# Patient Record
Sex: Female | Born: 1955
Health system: Southern US, Community
[De-identification: ages and names within clinical notes are randomized; demographics above are authoritative.]

## PROBLEM LIST (undated history)

## (undated) DIAGNOSIS — M1990 Unspecified osteoarthritis, unspecified site: Secondary | ICD-10-CM

## (undated) DIAGNOSIS — Z973 Presence of spectacles and contact lenses: Secondary | ICD-10-CM

## (undated) DIAGNOSIS — L409 Psoriasis, unspecified: Secondary | ICD-10-CM

## (undated) DIAGNOSIS — E785 Hyperlipidemia, unspecified: Secondary | ICD-10-CM

## (undated) DIAGNOSIS — E079 Disorder of thyroid, unspecified: Secondary | ICD-10-CM

## (undated) DIAGNOSIS — L4050 Arthropathic psoriasis, unspecified: Secondary | ICD-10-CM

## (undated) DIAGNOSIS — B001 Herpesviral vesicular dermatitis: Secondary | ICD-10-CM

## (undated) DIAGNOSIS — L209 Atopic dermatitis, unspecified: Secondary | ICD-10-CM

## (undated) DIAGNOSIS — T8859XA Other complications of anesthesia, initial encounter: Secondary | ICD-10-CM

## (undated) HISTORY — PX: COLONOSCOPY: SHX174

## (undated) HISTORY — PX: WISDOM TOOTH EXTRACTION: SHX21

## (undated) HISTORY — DX: Disorder of thyroid, unspecified: E07.9

## (undated) HISTORY — DX: Hyperlipidemia, unspecified: E78.5

## (undated) HISTORY — PX: OTHER SURGICAL HISTORY: SHX169

## (undated) HISTORY — DX: Psoriasis, unspecified: L40.9

## (undated) HISTORY — PX: TUBAL LIGATION: SHX77

## (undated) HISTORY — PX: KNEE ARTHROSCOPY: SUR90

## (undated) HISTORY — PX: SHOULDER ARTHROSCOPY: SHX128

---

## 1997-01-17 HISTORY — PX: COSMETIC SURGERY: SHX468

## 1997-04-26 ENCOUNTER — Emergency Department (HOSPITAL_COMMUNITY): Admission: EM | Admit: 1997-04-26 | Discharge: 1997-04-26 | Payer: Self-pay | Admitting: Emergency Medicine

## 1998-01-17 HISTORY — PX: ABDOMINAL HYSTERECTOMY: SHX81

## 1998-11-11 ENCOUNTER — Encounter: Admission: RE | Admit: 1998-11-11 | Discharge: 1998-11-11 | Payer: Self-pay | Admitting: General Surgery

## 1998-11-11 ENCOUNTER — Encounter: Payer: Self-pay | Admitting: General Surgery

## 1999-01-18 HISTORY — PX: BREAST SURGERY: SHX581

## 1999-06-23 ENCOUNTER — Other Ambulatory Visit: Admission: RE | Admit: 1999-06-23 | Discharge: 1999-06-23 | Payer: Self-pay | Admitting: Obstetrics and Gynecology

## 1999-07-15 ENCOUNTER — Encounter: Admission: RE | Admit: 1999-07-15 | Discharge: 1999-07-15 | Payer: Self-pay | Admitting: Plastic Surgery

## 1999-07-15 ENCOUNTER — Encounter: Payer: Self-pay | Admitting: Plastic Surgery

## 2000-02-24 ENCOUNTER — Encounter: Admission: RE | Admit: 2000-02-24 | Discharge: 2000-02-24 | Payer: Self-pay | Admitting: Family Medicine

## 2000-02-24 ENCOUNTER — Encounter: Payer: Self-pay | Admitting: Family Medicine

## 2000-06-28 ENCOUNTER — Other Ambulatory Visit: Admission: RE | Admit: 2000-06-28 | Discharge: 2000-06-28 | Payer: Self-pay | Admitting: Obstetrics and Gynecology

## 2000-11-08 ENCOUNTER — Encounter: Admission: RE | Admit: 2000-11-08 | Discharge: 2000-11-08 | Payer: Self-pay | Admitting: Orthopedic Surgery

## 2000-11-08 ENCOUNTER — Encounter: Payer: Self-pay | Admitting: Orthopedic Surgery

## 2001-08-08 ENCOUNTER — Other Ambulatory Visit: Admission: RE | Admit: 2001-08-08 | Discharge: 2001-08-08 | Payer: Self-pay | Admitting: Obstetrics and Gynecology

## 2001-08-24 ENCOUNTER — Encounter: Payer: Self-pay | Admitting: Obstetrics and Gynecology

## 2001-08-24 ENCOUNTER — Encounter: Admission: RE | Admit: 2001-08-24 | Discharge: 2001-08-24 | Payer: Self-pay | Admitting: Obstetrics and Gynecology

## 2002-01-07 ENCOUNTER — Emergency Department (HOSPITAL_COMMUNITY): Admission: EM | Admit: 2002-01-07 | Discharge: 2002-01-08 | Payer: Self-pay | Admitting: Emergency Medicine

## 2002-01-08 ENCOUNTER — Encounter: Payer: Self-pay | Admitting: Emergency Medicine

## 2002-02-01 ENCOUNTER — Ambulatory Visit (HOSPITAL_COMMUNITY): Admission: RE | Admit: 2002-02-01 | Discharge: 2002-02-01 | Payer: Self-pay | Admitting: Cardiology

## 2002-09-03 ENCOUNTER — Other Ambulatory Visit: Admission: RE | Admit: 2002-09-03 | Discharge: 2002-09-03 | Payer: Self-pay | Admitting: Obstetrics and Gynecology

## 2002-11-07 ENCOUNTER — Encounter: Admission: RE | Admit: 2002-11-07 | Discharge: 2002-11-07 | Payer: Self-pay | Admitting: Obstetrics and Gynecology

## 2002-11-07 ENCOUNTER — Encounter: Payer: Self-pay | Admitting: Obstetrics and Gynecology

## 2003-11-24 ENCOUNTER — Encounter: Admission: RE | Admit: 2003-11-24 | Discharge: 2003-11-24 | Payer: Self-pay | Admitting: Obstetrics and Gynecology

## 2006-01-17 ENCOUNTER — Emergency Department (HOSPITAL_COMMUNITY): Admission: EM | Admit: 2006-01-17 | Discharge: 2006-01-17 | Payer: Self-pay | Admitting: Family Medicine

## 2006-12-06 ENCOUNTER — Encounter: Admission: RE | Admit: 2006-12-06 | Discharge: 2006-12-06 | Payer: Self-pay | Admitting: Obstetrics and Gynecology

## 2008-12-26 ENCOUNTER — Encounter: Admission: RE | Admit: 2008-12-26 | Discharge: 2008-12-26 | Payer: Self-pay | Admitting: Obstetrics and Gynecology

## 2009-02-13 LAB — HM PAP SMEAR

## 2009-11-30 ENCOUNTER — Encounter: Admission: RE | Admit: 2009-11-30 | Discharge: 2009-11-30 | Payer: Self-pay | Admitting: Family Medicine

## 2009-11-30 IMAGING — CT CT ANGIO CHEST
3 of 7 series · 10 of 30 positions shown · IV contrast ([ID] OMNI 300)
Comparison: None.

CLINICAL DATA: Pleuritic chest pain.  Cough.

CT ANGIOGRAPHY CHEST WITH CONTRAST
TECHNIQUE: Multidetector CT imaging of the chest was performed
using the standard protocol during bolus administration of
intravenous contrast.  Multiplanar CT image reconstructions
including MIPs were obtained to evaluate the vascular anatomy.
Contrast:  100 ml [DT]

[Series 4: pe 1.25 · axial · 0.70mm/px · z∈[-242,-32]mm · 6 of 236 slices shown]
[im 34/236  lung]
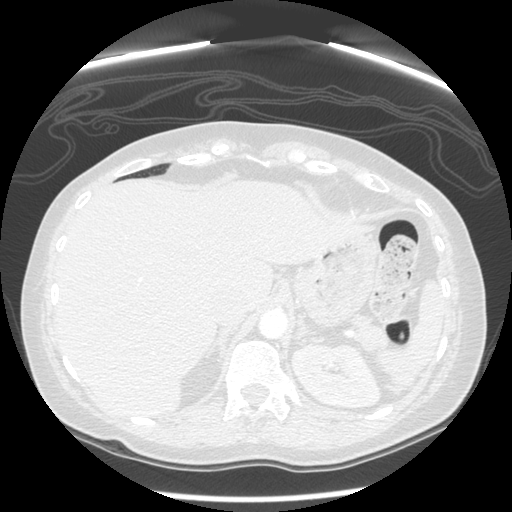
[im 68/236  mediastinal]
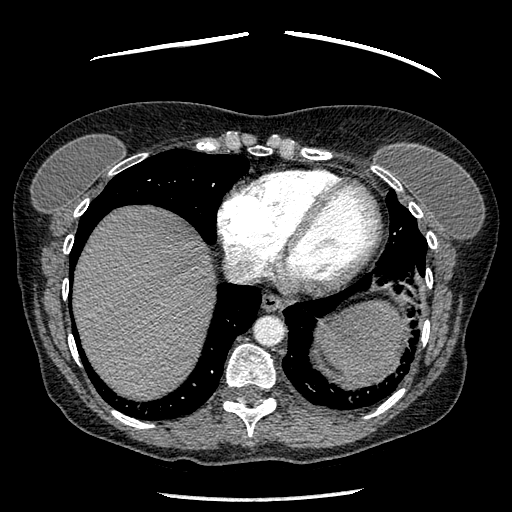
[im 101/236  lung]
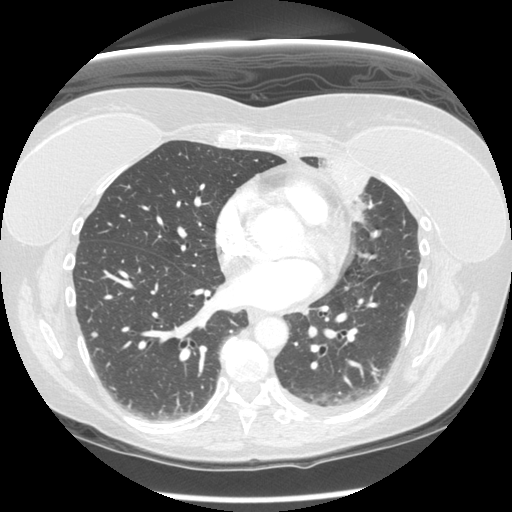
[im 135/236  mediastinal]
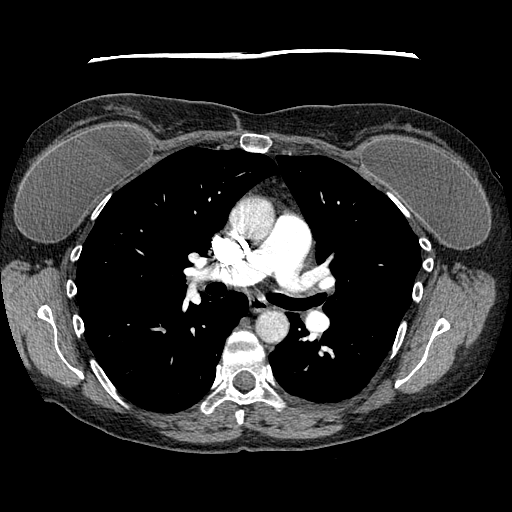
[im 168/236  lung]
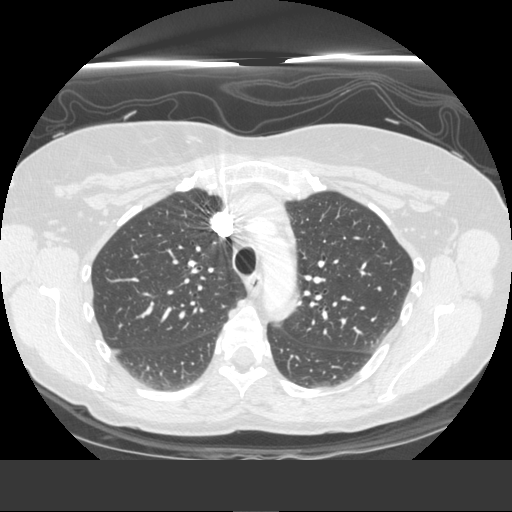
[im 202/236  mediastinal]
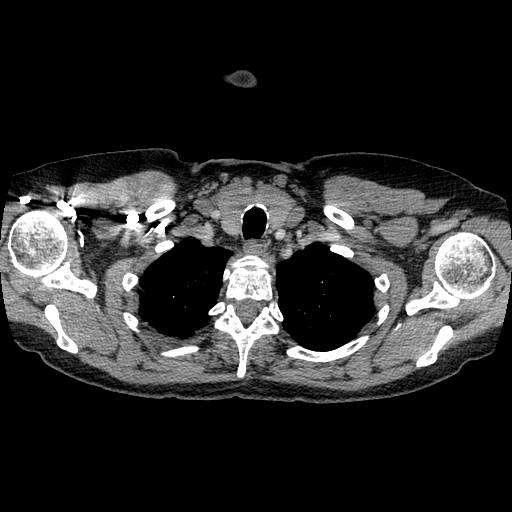

[Series 5: pe 2.5 · axial · 0.70mm/px · z∈[-185,-88]mm · 2 of 118 slices shown]
[im 40/118  lung]
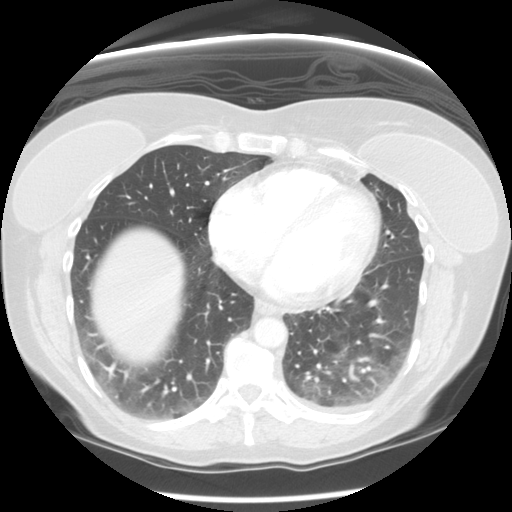
[im 79/118  lung]
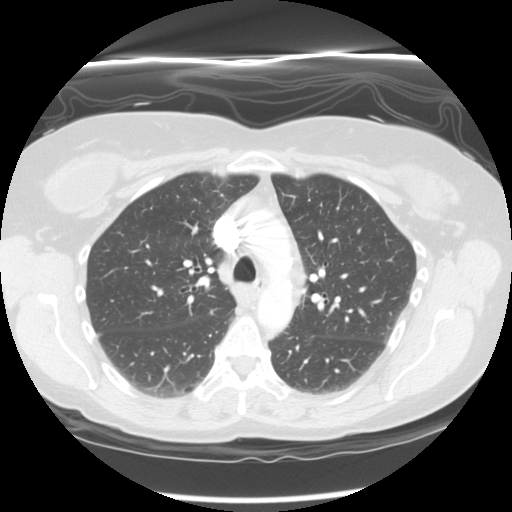

[Series 602: sagittal body · sagittal · 0.70mm/px · 2 of 145 slices shown]
[im 49/145  lung]
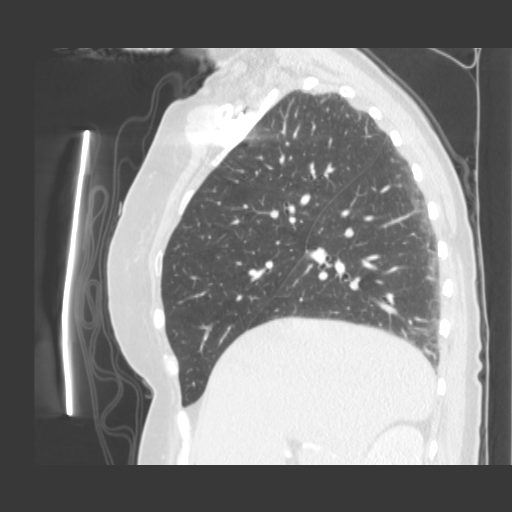
[im 97/145  lung]
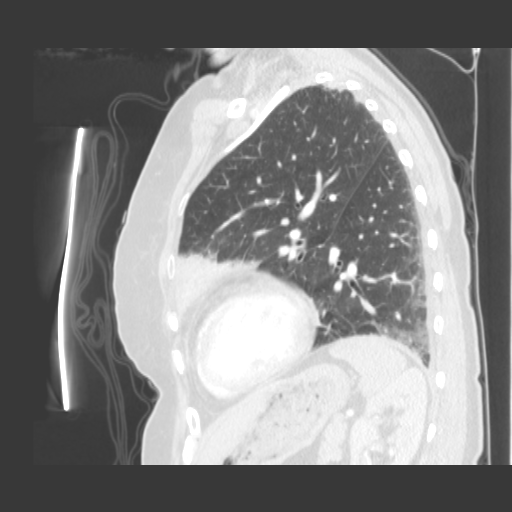

[10 of 30 positions shown; findings below may reference images not displayed]

FINDINGS: No pulmonary embolus.  Mediastinal lymph nodes measure
up to 8 mm in the AP window.  Mild bihilar lymphoid tissue.  No
axillary adenopathy.  Heart size normal.  No pericardial effusion.

Tiny left pleural effusion.  There is focal airspace consolidation
in the lingula, with surrounding inflammatory ground-glass.  A 4 mm
nodule in the right lower lobe is nonspecific (image 67).  Mild
dependent atelectasis bilaterally.  Airway is unremarkable.

Incidental imaging of the upper abdomen shows no acute findings.
No worrisome lytic or sclerotic lesions.

Review of the MIP images confirms the above findings.
IMPRESSION: 1.  No pulmonary embolus.
2.  Left upper lobe pneumonia.  Follow-up to clearing is
recommended.
3.  Tiny left parapneumonic effusion.

## 2009-12-17 ENCOUNTER — Encounter: Admission: RE | Admit: 2009-12-17 | Discharge: 2009-12-17 | Payer: Self-pay | Admitting: Family Medicine

## 2009-12-17 IMAGING — CR DG CHEST 2V
2 series · 2 of 2 positions shown · non-contrast
Comparison: Chest CTA [DATE].

CLINICAL DATA: 53-year-old female with cough and history of
smoking.  Abnormal chest CTA last month.

CHEST - 2 VIEW

[w chest pa]
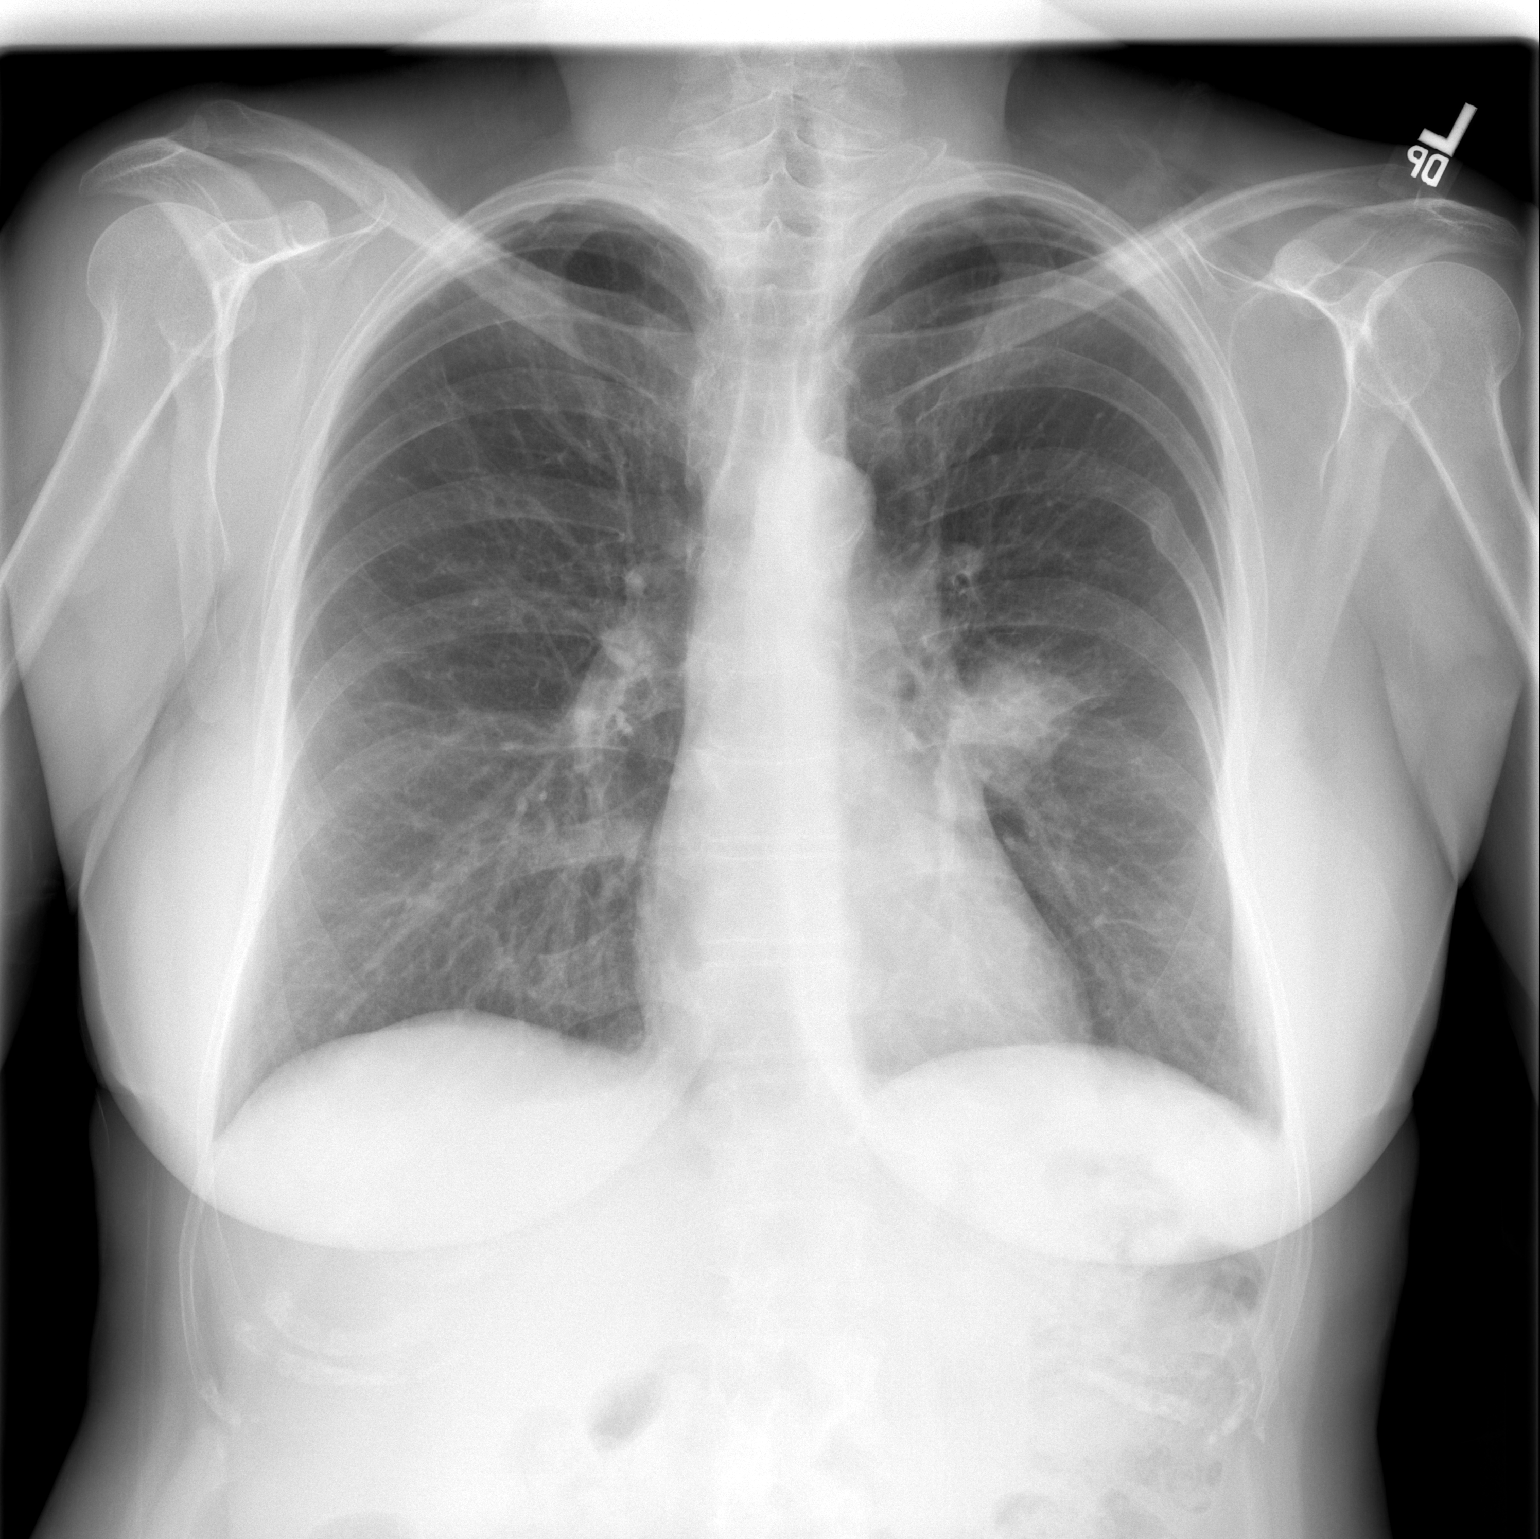

[w chest lat]
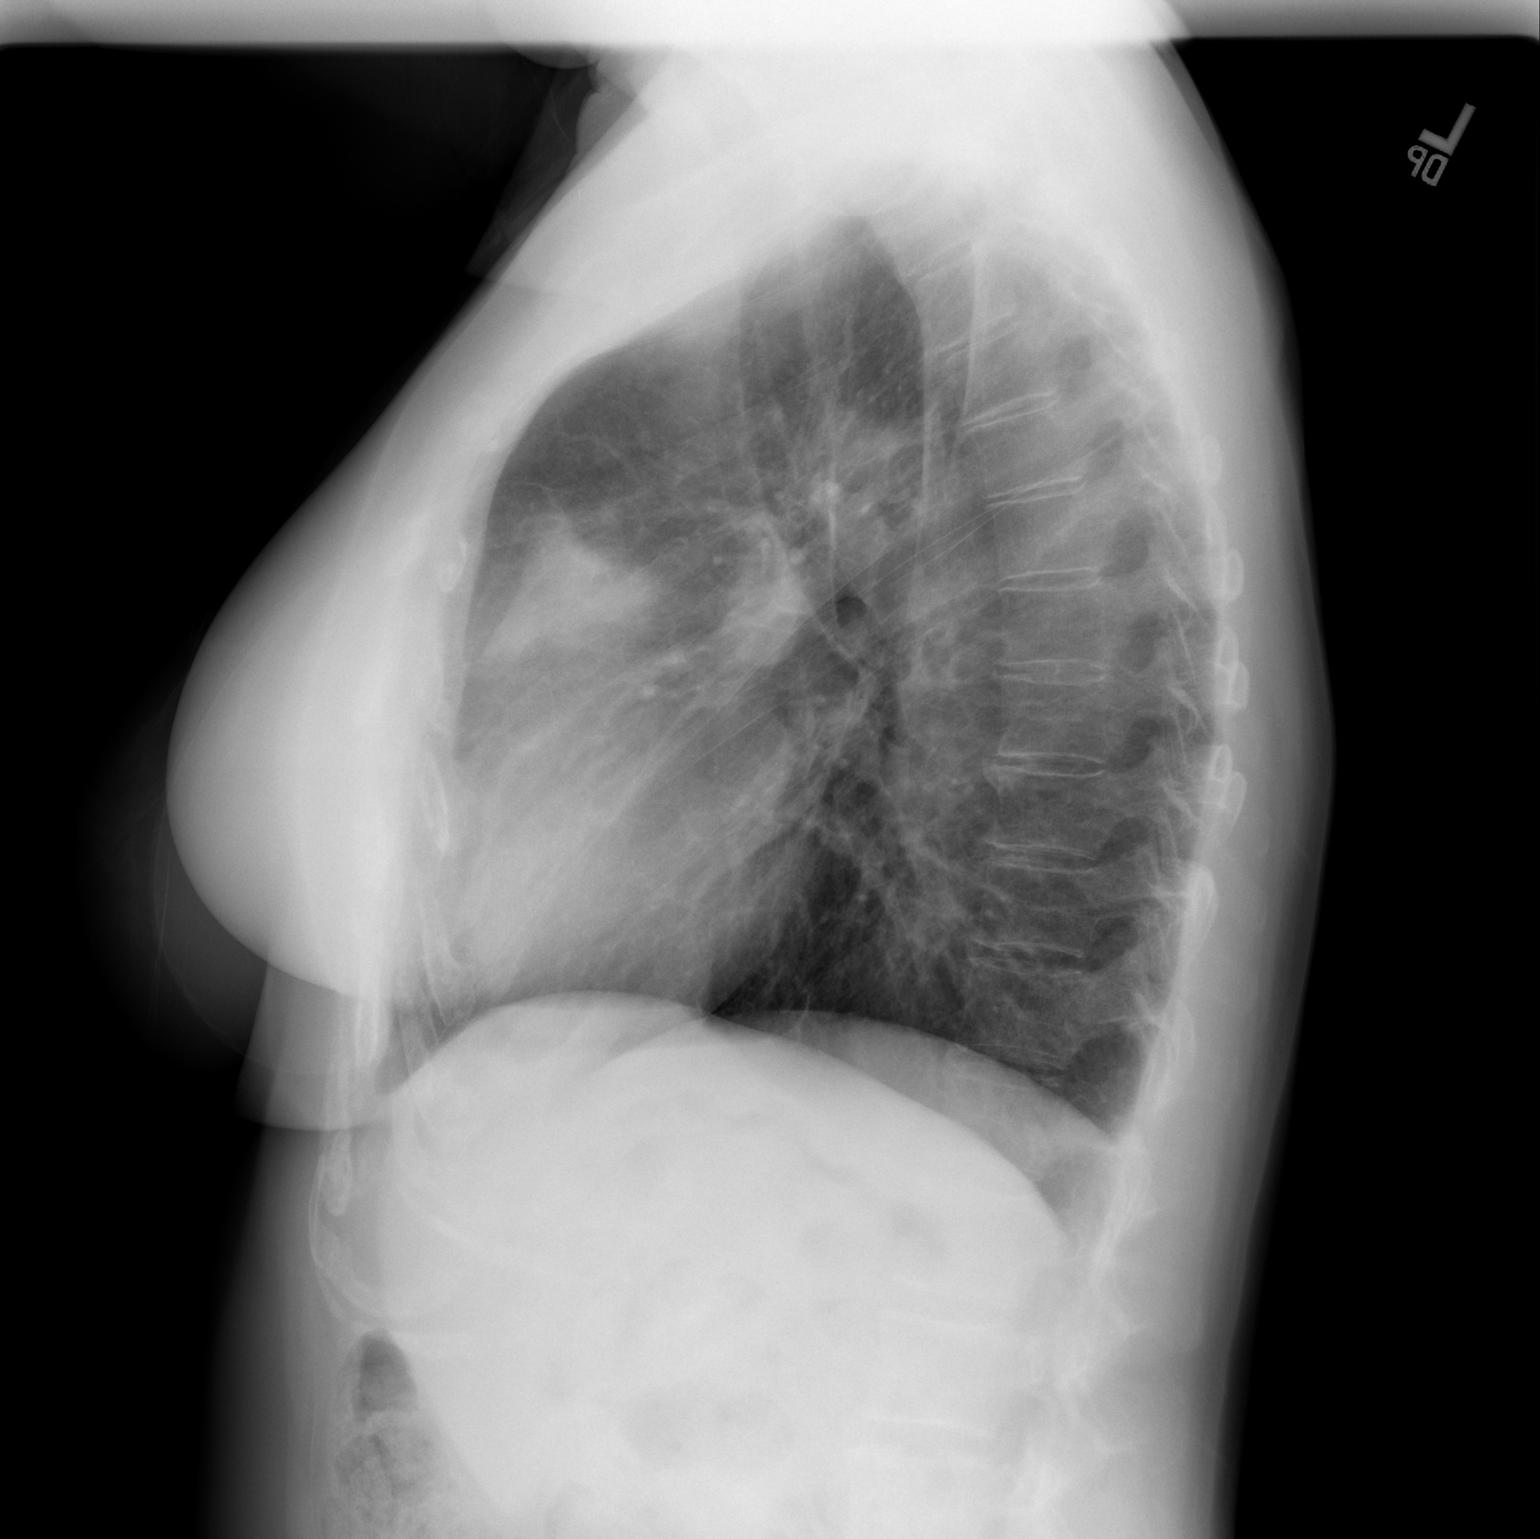

[2 of 2 positions shown; findings below may reference images not displayed]

FINDINGS: Focal triangular area of airspace consolidation in the
anteroinferior left upper lobe persists but has mildly decreased in
extent  since the comparison exam (compare today's lateral versus
sagittal series 602 image 99 of the comparison).

Pulmonary ventilation elsewhere is stable and within normal limits.
Cardiac size and mediastinal contours are within normal limits.  No
pleural effusion.  Chronic left posterior lateral rib fractures. No
acute osseous abnormality identified.
IMPRESSION: Anterior left upper lobe consolidation has not resolved, but is
mildly decreased in extent (51 x 31 mm on today's lateral versus 57
x 39 mm on the prior CT), and so may represent an under-treated
pneumonia.
Recommend repeat chest CT (IV contrast preferred) if this fails to
resolve in another couple of weeks.

## 2009-12-30 ENCOUNTER — Encounter
Admission: RE | Admit: 2009-12-30 | Discharge: 2009-12-30 | Payer: Self-pay | Source: Home / Self Care | Attending: Obstetrics and Gynecology | Admitting: Obstetrics and Gynecology

## 2010-06-04 NOTE — Cardiovascular Report (Signed)
NAME:  Ann Newton, Ann Newton                        ACCOUNT NO.:  [id number]   MEDICAL RECORD NO.:  [id number]                   PATIENT TYPE:  OIB   LOCATION:  2857                                 FACILITY:  [facility]   PHYSICIAN:  Francisca December, M.D.               DATE OF BIRTH:  05-14-1955   DATE OF PROCEDURE:  02/01/2002  DATE OF DISCHARGE:  02/01/2002                              CARDIAC CATHETERIZATION   PROCEDURES PERFORMED:  1. Left heart catheterization.  2. Coronary angiography.  3. Left ventriculogram.   INDICATIONS:  The patient is a 55 year old woman with multiple risk factors  for coronary disease, who has developed exertional angina.  Despite this, an  exercise stress test performed in the office was electrocardiographically  negative; however, she did develop anginal chest discomfort on the  treadmill.  She was therefore brought to the cardiac catheterization  laboratory to definitely rule out the presence of significant coronary  disease.   DESCRIPTION OF PROCEDURE:  The patient was brought to the cardiac  catheterization laboratory in the postabsorptive state.  The right groin was  prepped and draped in the usual sterile fashion.  Local anesthesia was  obtained with the infiltration of 1% lidocaine.  A 5 French catheter sheath  was inserted percutaneously into the right femoral artery utilizing an  anterior approach over a guiding J wire. A 110 cm pigtail catheter was used  to measure pressures in the ascending aorta and in the left ventricle both  prior to and following the ventriculogram. A 30-degree RAO cine left  ventriculogram was performed utilizing a power injector.  A 5 French #4 left and right Judkins catheters were then used to perform  cineangiography of each coronary artery in multiple LAO and RAO projections.  At the completion of the procedure, the catheter and catheter sheaths were  removed.  Hemostasis was obtained by direct pressure.  The patient  was  transported to the recovery area in stable condition with an intact distal  pulse.  All catheter manipulations were performed using fluoroscopic  observation and exchanges performed over a long guiding J wire.   HEMODYNAMICS:  Systemic arterial pressure was 100/60 with a mean of 78 mmHg.  There was no systolic gradient across the aortic valve.  The left  ventricular end-diastolic pressure was 9 mmHg pre ventriculogram and 11 mmHg  post ventriculogram.   ANGIOGRAPHY:  The left ventriculogram demonstrated normal chamber size and  normal global systolic function without significant regional wall motion  abnormality.  There is no significant mitral regurgitation and no coronary  calcification seen.  The visual estimate of the ejection fraction is 60%.   There was a right dominant coronary system present.  The main left coronary  was normal.   The left anterior descending artery and its branches were normal.  One large  diagonal and two smaller diagonals arise which are free of obstruction. The  anterior  descending artery reaches and barely traverses the apex.   The left circumflex coronary artery and its branches were normal.  Four  marginal branches arise, the first of which is moderate in size.  The second  is small. The third and forth are moderate to large.  Again, no obstructions whatsoever are seen within the left circumflex or  anterior descending arteries.   The right coronary artery and its branches are relatively small.  It did  give rise to AV nodal artery.  There is an acute marginal branch which  provides the distal septal perforators.  There is a small posterior  descending artery and very small posterolateral segment and branch.  Again,  no significant obstructions are seen whatsoever within these vessels.   Collateral vessels are not seen.   FINAL IMPRESSION:  1. Normal left ventricular size and systolic function.  2. Normal coronary arteries.  3. Noncardiac  rather typical sounding anginal chest pain.   PLAN/RECOMMENDATION:  The patient is presented with this gratifying news.  No coronary ischemic basis can be identified as an etiology for her  discomfort.  She will be treated empirically for reflux with a proton pump  inhibitor and should followup with Dr. Arvilla Market if this discomfort persist.                                                  Francisca December, M.D.    JHE/MEDQ  D:  02/01/2002  T:  02/02/2002  Job:  [id number]   cc:   Donia Guiles, M.D.  [address] [place]  [place] [address]  Fax: [phone]

## 2010-10-28 DIAGNOSIS — L403 Pustulosis palmaris et plantaris: Secondary | ICD-10-CM | POA: Insufficient documentation

## 2011-01-05 ENCOUNTER — Other Ambulatory Visit: Payer: Self-pay | Admitting: Obstetrics and Gynecology

## 2011-01-05 DIAGNOSIS — Z1231 Encounter for screening mammogram for malignant neoplasm of breast: Secondary | ICD-10-CM

## 2011-01-21 ENCOUNTER — Ambulatory Visit: Payer: Self-pay

## 2011-02-09 ENCOUNTER — Ambulatory Visit
Admission: RE | Admit: 2011-02-09 | Discharge: 2011-02-09 | Disposition: A | Discharge status: Home / Self Care | Payer: 59 | Source: Ambulatory Visit | Attending: Obstetrics and Gynecology | Admitting: Obstetrics and Gynecology

## 2011-02-09 DIAGNOSIS — Z1231 Encounter for screening mammogram for malignant neoplasm of breast: Secondary | ICD-10-CM

## 2011-02-15 ENCOUNTER — Other Ambulatory Visit: Payer: Self-pay | Admitting: Obstetrics and Gynecology

## 2011-02-15 DIAGNOSIS — R928 Other abnormal and inconclusive findings on diagnostic imaging of breast: Secondary | ICD-10-CM

## 2011-02-21 ENCOUNTER — Ambulatory Visit
Admission: RE | Admit: 2011-02-21 | Discharge: 2011-02-21 | Disposition: A | Discharge status: Home / Self Care | Payer: 59 | Source: Ambulatory Visit | Attending: Obstetrics and Gynecology | Admitting: Obstetrics and Gynecology

## 2011-02-21 DIAGNOSIS — R928 Other abnormal and inconclusive findings on diagnostic imaging of breast: Secondary | ICD-10-CM

## 2012-04-17 ENCOUNTER — Other Ambulatory Visit: Payer: Self-pay | Admitting: Obstetrics and Gynecology

## 2012-04-17 DIAGNOSIS — Z9882 Breast implant status: Secondary | ICD-10-CM

## 2012-04-17 DIAGNOSIS — N6009 Solitary cyst of unspecified breast: Secondary | ICD-10-CM

## 2012-04-30 ENCOUNTER — Other Ambulatory Visit: Payer: 59

## 2012-10-21 ENCOUNTER — Ambulatory Visit: Payer: 59

## 2012-10-21 ENCOUNTER — Ambulatory Visit (INDEPENDENT_AMBULATORY_CARE_PROVIDER_SITE_OTHER): Payer: 59 | Admitting: Family Medicine

## 2012-10-21 VITALS — BP 120/80 | HR 80 | Temp 98.8°F | Resp 16 | Ht 68.0 in | Wt 173.4 lb

## 2012-10-21 DIAGNOSIS — R109 Unspecified abdominal pain: Secondary | ICD-10-CM

## 2012-10-21 DIAGNOSIS — R079 Chest pain, unspecified: Secondary | ICD-10-CM

## 2012-10-21 DIAGNOSIS — R059 Cough, unspecified: Secondary | ICD-10-CM

## 2012-10-21 DIAGNOSIS — R42 Dizziness and giddiness: Secondary | ICD-10-CM

## 2012-10-21 DIAGNOSIS — R05 Cough: Secondary | ICD-10-CM

## 2012-10-21 IMAGING — CR DG CHEST 2V
2 series · 2 of 2 positions shown · non-contrast
Comparison: [DATE]

CLINICAL DATA: Left lower abdominal pain for 1 week.

EXAM:
CHEST  2 VIEW

[PA]
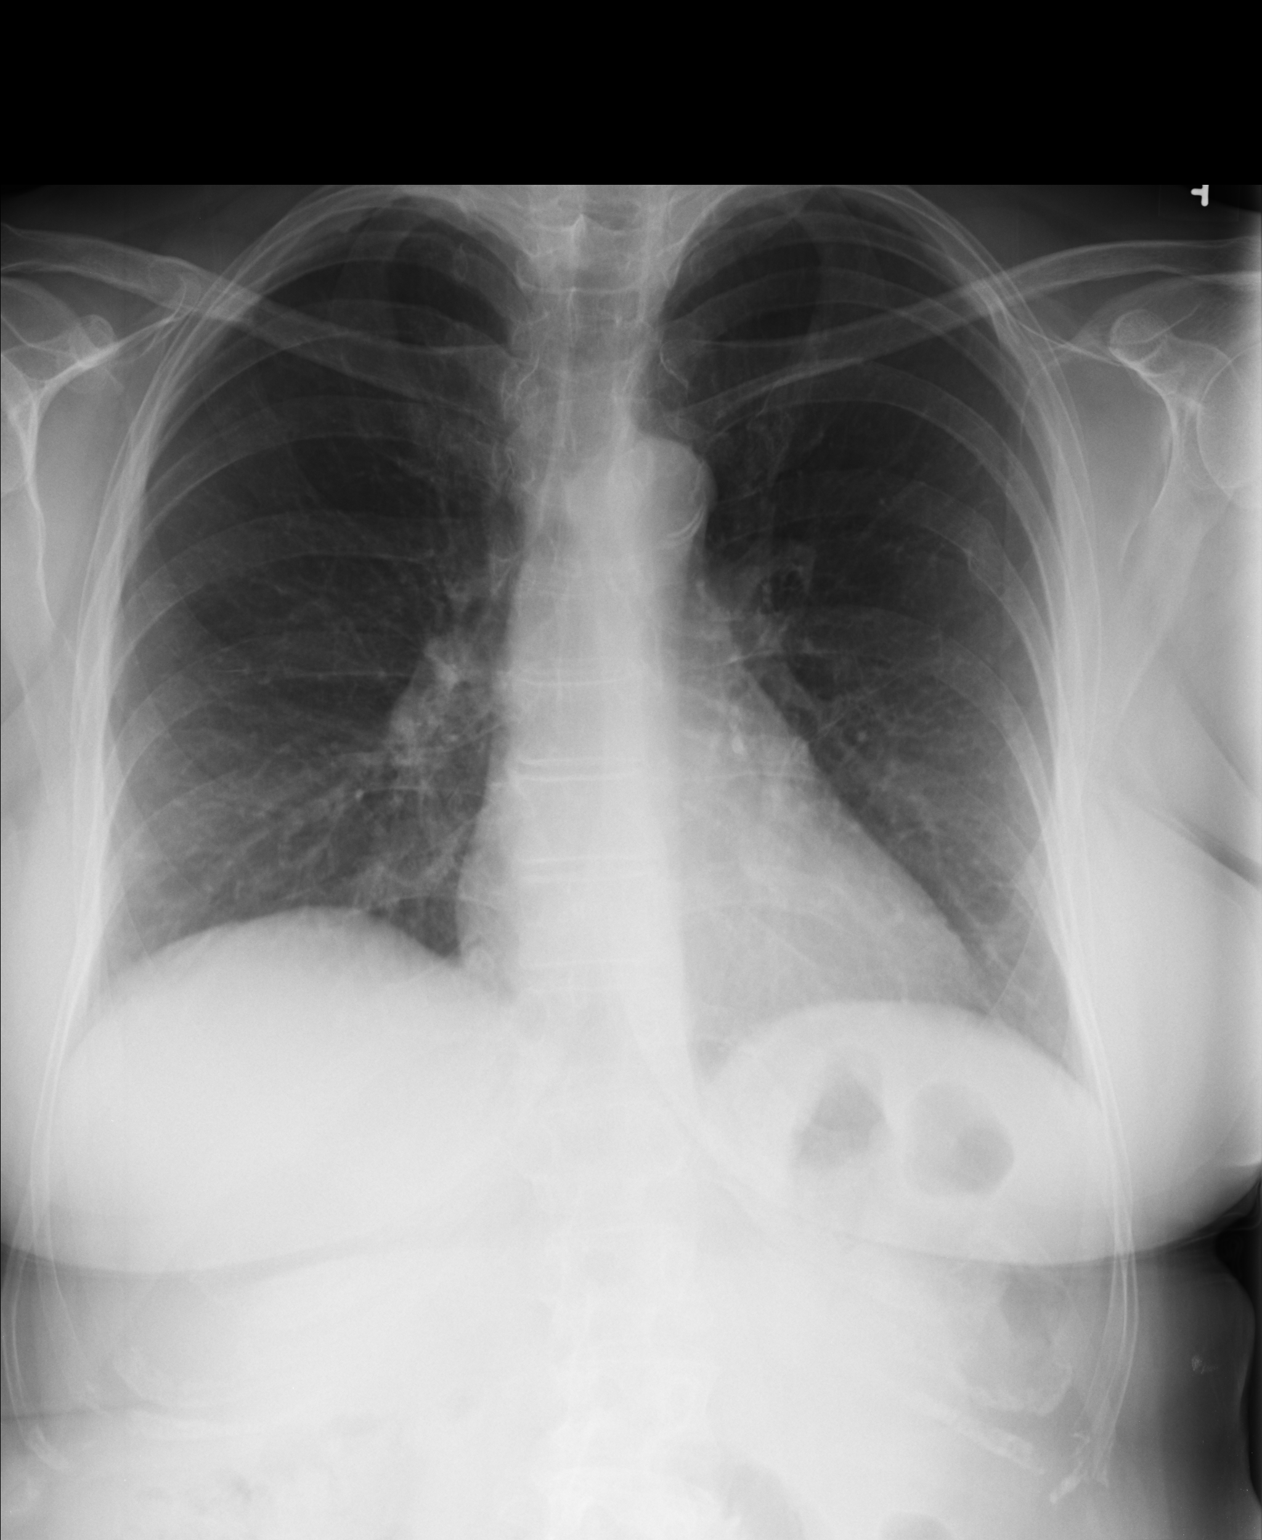

[lateral]
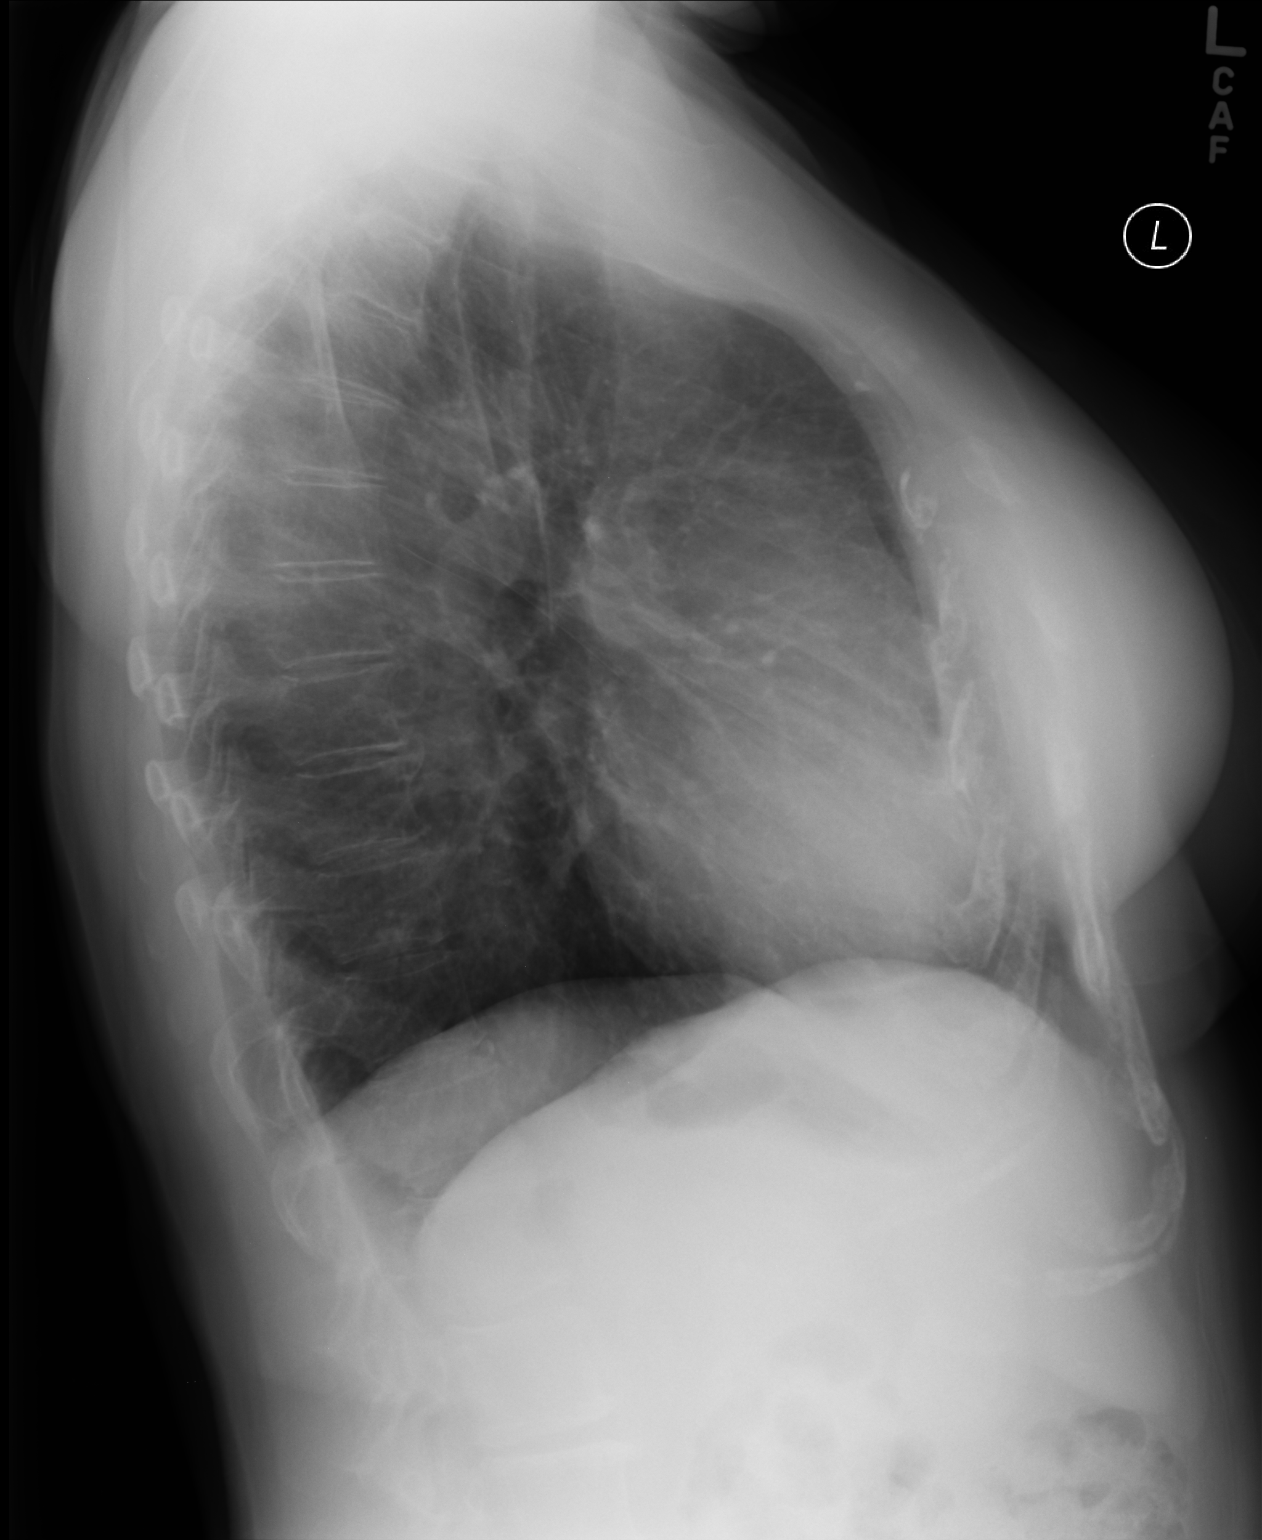

[2 of 2 positions shown; findings below may reference images not displayed]

FINDINGS: Lungs are clear. The cardiomediastinal silhouette and remainder of
the exam is unchanged to include old left rib fractures.
IMPRESSION: No active cardiopulmonary disease.

## 2012-10-21 MED ORDER — AZITHROMYCIN 250 MG PO TABS: ORAL_TABLET | ORAL | Status: DC

## 2012-10-21 MED ORDER — HYDROCODONE-HOMATROPINE 5-1.5 MG/5ML PO SYRP: 5.0000 mL | ORAL_SOLUTION | Freq: Three times a day (TID) | ORAL | Status: DC | PRN

## 2012-10-21 MED ORDER — PREDNISONE 20 MG PO TABS: ORAL_TABLET | ORAL | Status: DC

## 2012-10-21 NOTE — Progress Notes (Addendum)
Subjective:    Patient ID: Ann Newton, female    DOB: [birth year], 56 y.o.   MRN: [id number]  HPI    Review of Systems     Objective:   Physical Exam        Assessment & Plan:   This chart was scribed for Elvina Sidle, MD by Valera Castle, ED Scribe. This patient was seen in room 8 and the patient's care was started at 1:09.   @[facility]GO @  Patient ID: Ann Newton MRN: [id number], DOB: [birth year], 57 y.o. Date of Encounter: 10/21/2012, 1:08 PM  .  Primary Physician: No primary provider on file.  Chief Complaint: Abdominal Pain, Dizziness  HPI: 57 y.o. year old female with history below presents with abdominal pain and dizziness. She states that the abdominal pain has been consistent with 1 spot underneath her rib. She states that when she coughs it exacerbates the pain. She reports that there has been some congestion build up recently. She states that she yawns all the time trying to pop her ears, but has been unable to.  She denies any rhinorrhea, fever, or any other associated symptoms.   She denies having a h/o asthma. She reports smoking about .5 PPD.    History reviewed. No pertinent past medical history.   Home Meds: Prior to Admission medications   Medication Sig Start Date End Date Taking? Authorizing Provider  acyclovir (ZOVIRAX) 400 MG tablet Take 400 mg by mouth daily as needed.   Yes Historical Provider, MD  estradiol (ESTRACE) 1 MG tablet Take 1 mg by mouth daily.   Yes Historical Provider, MD  hydrochlorothiazide (HYDRODIURIL) 25 MG tablet Take 25 mg by mouth daily.   Yes Historical Provider, MD  meloxicam (MOBIC) 7.5 MG tablet Take 7.5 mg by mouth daily as needed for pain.   Yes Historical Provider, MD  vitamin B-12 (CYANOCOBALAMIN) 1000 MCG tablet Take 2,500 mcg by mouth daily.   Yes Historical Provider, MD    Allergies: No Known Allergies  History   Social History  . Marital Status: Married    Spouse Name: N/A    Number of Children:  N/A  . Years of Education: N/A   Occupational History  . Not on file.   Social History Main Topics  . Smoking status: Current Every Day Smoker  . Smokeless tobacco: Not on file  . Alcohol Use: Not on file  . Drug Use: Not on file  . Sexual Activity: Not on file   Other Topics Concern  . Not on file   Social History Narrative  . No narrative on file     Review of Systems: Constitutional: negative for chills, fever, night sweats, weight changes, or fatigue  HEENT: negative for vision changes, hearing loss, congestion, rhinorrhea, ST, epistaxis, or sinus pressure Cardiovascular: negative for chest pain or palpitations Respiratory: negative for hemoptysis, wheezing,  Abdominal: negative for  nausea, vomiting, diarrhea, or constipation Dermatological: negative for rash Neurologic: negative for headache, dizziness, or syncope All other systems reviewed and are otherwise negative with the exception to those above and in the HPI.   Physical Exam: Blood pressure 120/80, pulse 80, temperature 98.8 F (37.1 C), temperature source Oral, resp. rate 16, height 5\' 8"  (1.727 m), weight 173 lb 6.4 oz (78.654 kg), SpO2 97.00%., Body mass index is 26.37 kg/(m^2). General: Well developed, well nourished, in no acute distress. Head: Normocephalic, atraumatic, eyes without discharge, sclera non-icteric, nares are without discharge. Bilateral auditory canals clear, TM's are without perforation, pearly  grey and translucent with reflective cone of light bilaterally. Oral cavity moist, posterior pharynx without exudate, erythema, peritonsillar abscess, or post nasal drip.  Neck: Supple. No thyromegaly. Full ROM. No lymphadenopathy. Lungs: Clear bilaterally to auscultation without wheezes, rales, or rhonchi. Breathing is unlabored.  Tender just below left breast on lower anterior ribs at [facility] Heart: RRR with S1 S2. No murmurs, rubs, or gallops appreciated. Abdomen: Soft, non-tender, non-distended with  normoactive bowel sounds. No hepatomegaly. No rebound/guarding. No obvious abdominal masses. Msk:  Strength and tone normal for age. Extremities/Skin: Warm and dry. No clubbing or cyanosis. No edema. No rashes or suspicious lesions. Neuro: Alert and oriented X 3. Moves all extremities spontaneously. Gait is normal. CNII-XII grossly in tact. Psych:  Responds to questions appropriately with a normal affect.  [facility] reading (PRIMARY) by  Dr. Milus Glazier:  CXR:  Fine interstitial markings.   ASSESSMENT AND PLAN:  57 y.o. year old female with persistent cough, now productive in am, with fullness in ears and sense of congestion and decreased hearing for 3 months.  As a smoker, she needed the chest film. Dizziness and giddiness - Plan: DG Chest 2 View  Abdominal  pain, other specified site - Plan: DG Chest 2 View  Chest pain - Plan: azithromycin (ZITHROMAX Z-PAK) 250 MG tablet  Cough - Plan: predniSONE (DELTASONE) 20 MG tablet, HYDROcodone-homatropine (HYCODAN) 5-1.5 MG/5ML syrup  Signed, Elvina Sidle, MD    Signed, Elvina Sidle, MD 10/21/2012 1:08 PM

## 2012-11-19 ENCOUNTER — Other Ambulatory Visit: Payer: Self-pay | Admitting: Obstetrics and Gynecology

## 2012-11-19 DIAGNOSIS — N63 Unspecified lump in unspecified breast: Secondary | ICD-10-CM

## 2012-11-22 ENCOUNTER — Other Ambulatory Visit: Payer: Self-pay

## 2012-12-05 ENCOUNTER — Ambulatory Visit
Admission: RE | Admit: 2012-12-05 | Discharge: 2012-12-05 | Disposition: A | Discharge status: Home / Self Care | Payer: 59 | Source: Ambulatory Visit | Attending: Obstetrics and Gynecology | Admitting: Obstetrics and Gynecology

## 2012-12-05 DIAGNOSIS — N63 Unspecified lump in unspecified breast: Secondary | ICD-10-CM

## 2013-09-12 ENCOUNTER — Ambulatory Visit: Payer: Self-pay | Admitting: Physician Assistant

## 2013-09-12 ENCOUNTER — Encounter (HOSPITAL_BASED_OUTPATIENT_CLINIC_OR_DEPARTMENT_OTHER): Payer: Self-pay | Admitting: *Deleted

## 2013-09-12 NOTE — H&P (Signed)
Ann Newton is an 58 y.o. female.   Chief Complaint: Left foot mortons neuroma  HPI: The patient is a 58 year old female known to the office who presents with six months of increasing left foot pain.  She states she has psoriasis on the heel of her feet and for a long time did walk more on the ball of her foot.  She is describing the pain more between the third and fourth toe. She also notices a separation between those toes as well as some numbness along the third and fourth toe. We had injected the area without relief.  We had obtained MRI which was negative for any stress reaction or fractures.  She states unable to wear regular shoe for any period of time and has increased pain and numbness in the foot mainly between 3rd-4th toes.    Past Medical History  Diagnosis Date  . Arthritis   . Wears contact lenses     Past Surgical History  Procedure Laterality Date  . Cosmetic surgery  1999    breast implants  . Knee arthroscopy  2013,2011    right/left  . Shoulder arthroscopy      rt  . Abdominal hysterectomy  2000    part  . Colonoscopy    . Tubal ligation      Family History  Problem Relation Age of Onset  . Kidney disease Mother   . Heart attack Father   . Arthritis Brother   . Heart attack Maternal Grandmother   . Heart attack Maternal Grandfather   . Cancer Paternal Grandmother   . Heart attack Paternal Grandfather    Social History:  reports that she quit smoking about a year ago. She does not have any smokeless tobacco history on file. She reports that she does not drink alcohol or use illicit drugs.  Allergies: No Known Allergies   (Not in a hospital admission)  No results found for this or any previous visit (from the past 48 hour(s)). No results found.  Review of Systems  Constitutional: Negative.   HENT: Negative.   Eyes: Negative.   Respiratory: Negative.   Cardiovascular: Negative.   Gastrointestinal: Negative.   Genitourinary: Negative.    Musculoskeletal: Positive for myalgias.  Skin: Positive for itching and rash.  Neurological: Positive for tingling. Negative for dizziness, tremors, sensory change, speech change, focal weakness, seizures and loss of consciousness.  Endo/Heme/Allergies: Negative.   Psychiatric/Behavioral: Negative.     There were no vitals taken for this visit. Physical Exam  Constitutional: She is oriented to person, place, and time. She appears well-developed and well-nourished. No distress.  HENT:  Head: Normocephalic and atraumatic.  Nose: Nose normal.  Eyes: Conjunctivae and EOM are normal. Pupils are equal, round, and reactive to light.  Neck: Normal range of motion. Neck supple.  Cardiovascular: Normal rate and intact distal pulses.   Respiratory: Effort normal. No respiratory distress. She has no wheezes.  GI: Soft. She exhibits no distension. There is no tenderness.  Musculoskeletal:  Left foot- TTP between 3rd-4th webspace, N/T, gapping between 3rd-4th toe causing some over lap 2nd over 3rd toe.    Lymphadenopathy:    She has no cervical adenopathy.  Neurological: She is alert and oriented to person, place, and time. No cranial nerve deficit.  Skin: Skin is warm and dry. Rash noted. No erythema.  Diffuse psoriatic rash  Psychiatric: She has a normal mood and affect. Her behavior is normal.     Assessment/Plan Left foot Mortons Neuroma  We discussed risks and benefits of excision and patient wishes to proceed.  This will be done outpatient general anasthesia tomorrow.    Stella Bortle 09/12/2013, 1:06 PM    

## 2013-09-12 NOTE — Progress Notes (Signed)
No labs needed-will bring her crutches

## 2013-09-13 ENCOUNTER — Encounter (HOSPITAL_BASED_OUTPATIENT_CLINIC_OR_DEPARTMENT_OTHER)
Admission: RE | Disposition: A | Discharge status: Home / Self Care | Payer: Self-pay | Source: Ambulatory Visit | Attending: Orthopedic Surgery

## 2013-09-13 ENCOUNTER — Ambulatory Visit (HOSPITAL_BASED_OUTPATIENT_CLINIC_OR_DEPARTMENT_OTHER): Payer: 59 | Admitting: Anesthesiology

## 2013-09-13 ENCOUNTER — Ambulatory Visit (HOSPITAL_BASED_OUTPATIENT_CLINIC_OR_DEPARTMENT_OTHER)
Admission: RE | Admit: 2013-09-13 | Discharge: 2013-09-13 | Disposition: A | Discharge status: Home / Self Care | Payer: 59 | Source: Ambulatory Visit | Attending: Orthopedic Surgery | Admitting: Orthopedic Surgery

## 2013-09-13 ENCOUNTER — Encounter (HOSPITAL_BASED_OUTPATIENT_CLINIC_OR_DEPARTMENT_OTHER): Payer: 59 | Admitting: Anesthesiology

## 2013-09-13 ENCOUNTER — Encounter (HOSPITAL_BASED_OUTPATIENT_CLINIC_OR_DEPARTMENT_OTHER): Payer: Self-pay | Admitting: *Deleted

## 2013-09-13 DIAGNOSIS — G5760 Lesion of plantar nerve, unspecified lower limb: Secondary | ICD-10-CM | POA: Insufficient documentation

## 2013-09-13 DIAGNOSIS — Z87891 Personal history of nicotine dependence: Secondary | ICD-10-CM | POA: Insufficient documentation

## 2013-09-13 HISTORY — PX: HAGLAND'S DEFORMITY EXCISION: SHX1718

## 2013-09-13 HISTORY — DX: Presence of spectacles and contact lenses: Z97.3

## 2013-09-13 HISTORY — DX: Unspecified osteoarthritis, unspecified site: M19.90

## 2013-09-13 LAB — POCT HEMOGLOBIN-HEMACUE: Hemoglobin: 14.7 g/dL (ref 12.0–15.0)

## 2013-09-13 SURGERY — EXOSTOSIS EXCISION/HAGLUND'S DEFORMITY/PUMP BUMP
Anesthesia: General | Site: Foot | Laterality: Left

## 2013-09-13 MED ORDER — BUPIVACAINE HCL (PF) 0.5 % IJ SOLN
INTRAMUSCULAR | Status: DC | PRN
  Administered 2013-09-13: 10 mL

## 2013-09-13 MED ORDER — OXYCODONE HCL 5 MG PO TABS
ORAL_TABLET | ORAL | Status: AC
  Filled 2013-09-13: qty 1

## 2013-09-13 MED ORDER — BUPIVACAINE-EPINEPHRINE (PF) 0.5% -1:200000 IJ SOLN
INTRAMUSCULAR | Status: AC
  Filled 2013-09-13: qty 30

## 2013-09-13 MED ORDER — ONDANSETRON HCL 4 MG/2ML IJ SOLN
INTRAMUSCULAR | Status: DC | PRN
  Administered 2013-09-13: 4 mg via INTRAVENOUS

## 2013-09-13 MED ORDER — OXYCODONE HCL 5 MG PO TABS
5.0000 mg | ORAL_TABLET | Freq: Once | ORAL | Status: AC
  Administered 2013-09-13: 5 mg via ORAL

## 2013-09-13 MED ORDER — MIDAZOLAM HCL 2 MG/2ML IJ SOLN
INTRAMUSCULAR | Status: AC
  Filled 2013-09-13: qty 2

## 2013-09-13 MED ORDER — SODIUM CHLORIDE 0.9 % IV SOLN: INTRAVENOUS | Status: DC

## 2013-09-13 MED ORDER — HYDROMORPHONE HCL PF 1 MG/ML IJ SOLN
INTRAMUSCULAR | Status: AC
  Filled 2013-09-13: qty 1

## 2013-09-13 MED ORDER — OXYCODONE-ACETAMINOPHEN 5-325 MG PO TABS: 1.0000 | ORAL_TABLET | ORAL | Status: DC | PRN

## 2013-09-13 MED ORDER — BUPIVACAINE HCL (PF) 0.5 % IJ SOLN
INTRAMUSCULAR | Status: AC
  Filled 2013-09-13: qty 30

## 2013-09-13 MED ORDER — FENTANYL CITRATE 0.05 MG/ML IJ SOLN
INTRAMUSCULAR | Status: AC
  Filled 2013-09-13: qty 4

## 2013-09-13 MED ORDER — HYDROMORPHONE HCL PF 1 MG/ML IJ SOLN
0.2500 mg | INTRAMUSCULAR | Status: DC | PRN
  Administered 2013-09-13: 0.5 mg via INTRAVENOUS

## 2013-09-13 MED ORDER — CEFAZOLIN SODIUM-DEXTROSE 2-3 GM-% IV SOLR
INTRAVENOUS | Status: AC
  Filled 2013-09-13: qty 50

## 2013-09-13 MED ORDER — CHLORHEXIDINE GLUCONATE 4 % EX LIQD: 60.0000 mL | Freq: Once | CUTANEOUS | Status: DC

## 2013-09-13 MED ORDER — FENTANYL CITRATE 0.05 MG/ML IJ SOLN
INTRAMUSCULAR | Status: DC | PRN
  Administered 2013-09-13: 100 ug via INTRAVENOUS
  Administered 2013-09-13 (×4): 25 ug via INTRAVENOUS

## 2013-09-13 MED ORDER — PROPOFOL 10 MG/ML IV BOLUS
INTRAVENOUS | Status: DC | PRN
  Administered 2013-09-13: 200 mg via INTRAVENOUS

## 2013-09-13 MED ORDER — LIDOCAINE HCL (CARDIAC) 20 MG/ML IV SOLN
INTRAVENOUS | Status: DC | PRN
  Administered 2013-09-13: 60 mg via INTRAVENOUS

## 2013-09-13 MED ORDER — CEFAZOLIN SODIUM-DEXTROSE 2-3 GM-% IV SOLR
2.0000 g | INTRAVENOUS | Status: AC
  Administered 2013-09-13: 2 g via INTRAVENOUS

## 2013-09-13 MED ORDER — MIDAZOLAM HCL 5 MG/5ML IJ SOLN
INTRAMUSCULAR | Status: DC | PRN
  Administered 2013-09-13: 2 mg via INTRAVENOUS

## 2013-09-13 MED ORDER — LACTATED RINGERS IV SOLN
INTRAVENOUS | Status: DC
  Administered 2013-09-13 (×2): via INTRAVENOUS

## 2013-09-13 MED ORDER — ONDANSETRON HCL 4 MG/2ML IJ SOLN: 4.0000 mg | Freq: Once | INTRAMUSCULAR | Status: DC | PRN

## 2013-09-13 MED ORDER — BUPIVACAINE-EPINEPHRINE (PF) 0.5% -1:200000 IJ SOLN
INTRAMUSCULAR | Status: AC
  Filled 2013-09-13: qty 1.8

## 2013-09-13 MED ORDER — FENTANYL CITRATE 0.05 MG/ML IJ SOLN: 50.0000 ug | INTRAMUSCULAR | Status: DC | PRN

## 2013-09-13 MED ORDER — MIDAZOLAM HCL 2 MG/2ML IJ SOLN: 1.0000 mg | INTRAMUSCULAR | Status: DC | PRN

## 2013-09-13 MED ORDER — DEXAMETHASONE SODIUM PHOSPHATE 10 MG/ML IJ SOLN
INTRAMUSCULAR | Status: DC | PRN
  Administered 2013-09-13: 10 mg via INTRAVENOUS

## 2013-09-13 SURGICAL SUPPLY — 52 items
BANDAGE ELASTIC 4 VELCRO ST LF (GAUZE/BANDAGES/DRESSINGS) ×3 IMPLANT
BLADE SURG 15 STRL LF DISP TIS (BLADE) ×1 IMPLANT
BLADE SURG 15 STRL SS (BLADE) ×2
BNDG ESMARK 4X9 LF (GAUZE/BANDAGES/DRESSINGS) ×3 IMPLANT
BOOT STEPPER DURA MED (SOFTGOODS) ×3 IMPLANT
CANISTER SUCT 1200ML W/VALVE (MISCELLANEOUS) ×3 IMPLANT
CORDS BIPOLAR (ELECTRODE) ×3 IMPLANT
COVER MAYO STAND STRL (DRAPES) ×3 IMPLANT
COVER TABLE BACK 60X90 (DRAPES) ×3 IMPLANT
CUFF TOURNIQUET SINGLE 18IN (TOURNIQUET CUFF) ×3 IMPLANT
DECANTER SPIKE VIAL GLASS SM (MISCELLANEOUS) IMPLANT
DRAPE EXTREMITY T 121X128X90 (DRAPE) ×3 IMPLANT
DRAPE OEC MINIVIEW 54X84 (DRAPES) IMPLANT
DRSG EMULSION OIL 3X3 NADH (GAUZE/BANDAGES/DRESSINGS) ×3 IMPLANT
DURAPREP 26ML APPLICATOR (WOUND CARE) ×3 IMPLANT
ELECT REM PT RETURN 9FT ADLT (ELECTROSURGICAL)
ELECTRODE REM PT RTRN 9FT ADLT (ELECTROSURGICAL) IMPLANT
GAUZE SPONGE 4X4 12PLY STRL (GAUZE/BANDAGES/DRESSINGS) ×3 IMPLANT
GLOVE BIO SURGEON STRL SZ7 (GLOVE) ×3 IMPLANT
GLOVE BIO SURGEON STRL SZ7.5 (GLOVE) ×3 IMPLANT
GLOVE BIOGEL PI IND STRL 7.5 (GLOVE) IMPLANT
GLOVE BIOGEL PI IND STRL 8 (GLOVE) ×2 IMPLANT
GLOVE BIOGEL PI INDICATOR 7.5 (GLOVE)
GLOVE BIOGEL PI INDICATOR 8 (GLOVE) ×4
GLOVE SURG ORTHO 8.0 STRL STRW (GLOVE) ×3 IMPLANT
GOWN PREVENTION PLUS XLARGE (GOWN DISPOSABLE) ×3 IMPLANT
GOWN STRL REUS W/ TWL LRG LVL3 (GOWN DISPOSABLE) ×2 IMPLANT
GOWN STRL REUS W/TWL LRG LVL3 (GOWN DISPOSABLE) ×4
NEEDLE HYPO 22GX1.5 SAFETY (NEEDLE) ×3 IMPLANT
NS IRRIG 1000ML POUR BTL (IV SOLUTION) ×3 IMPLANT
PACK BASIN DAY SURGERY FS (CUSTOM PROCEDURE TRAY) ×3 IMPLANT
PAD CAST 4YDX4 CTTN HI CHSV (CAST SUPPLIES) ×1 IMPLANT
PADDING CAST ABS 4INX4YD NS (CAST SUPPLIES)
PADDING CAST ABS COTTON 4X4 ST (CAST SUPPLIES) IMPLANT
PADDING CAST COTTON 4X4 STRL (CAST SUPPLIES) ×2
PENCIL BUTTON HOLSTER BLD 10FT (ELECTRODE) ×3 IMPLANT
SPLINT PLASTER CAST XFAST 3X15 (CAST SUPPLIES) IMPLANT
SPLINT PLASTER XTRA FASTSET 3X (CAST SUPPLIES)
STOCKINETTE 6  STRL (DRAPES) ×2
STOCKINETTE 6 STRL (DRAPES) ×1 IMPLANT
SUCTION FRAZIER TIP 10 FR DISP (SUCTIONS) ×3 IMPLANT
SUT ETHILON 3 0 PS 1 (SUTURE) ×3 IMPLANT
SUT ETHILON 4 0 PS 2 18 (SUTURE) IMPLANT
SUT VIC AB 2-0 SH 27 (SUTURE) ×2
SUT VIC AB 2-0 SH 27XBRD (SUTURE) ×1 IMPLANT
SUT VICRYL 4-0 PS2 18IN ABS (SUTURE) IMPLANT
SYR BULB 3OZ (MISCELLANEOUS) ×3 IMPLANT
SYR CONTROL 10ML LL (SYRINGE) ×3 IMPLANT
TOWEL OR 17X24 6PK STRL BLUE (TOWEL DISPOSABLE) ×6 IMPLANT
TUBE CONNECTING 20'X1/4 (TUBING) ×1
TUBE CONNECTING 20X1/4 (TUBING) ×2 IMPLANT
UNDERPAD 30X30 INCONTINENT (UNDERPADS AND DIAPERS) ×3 IMPLANT

## 2013-09-13 NOTE — Anesthesia Postprocedure Evaluation (Signed)
  Anesthesia Post-op Note  Patient: Ann Newton  Procedure(s) Performed: Procedure(s): LEFT FOOT: EXCISION INTERDIGITAL MORTON'S NEUROMA SINGLE EACH (Left)  Patient Location: PACU  Anesthesia Type:General  Level of Consciousness: awake, alert , oriented and patient cooperative  Airway and Oxygen Therapy: Patient Spontanous Breathing  Post-op Pain: mild  Post-op Assessment: Post-op Vital signs reviewed, Patient's Cardiovascular Status Stable, Respiratory Function Stable, Patent Airway and No signs of Nausea or vomiting  Post-op Vital Signs: stable  Last Vitals:  Filed Vitals:   09/13/13 1105  BP: 106/62  Pulse: 76  Temp: 36.5 C  Resp: 15    Complications: No apparent anesthesia complications

## 2013-09-13 NOTE — Anesthesia Preprocedure Evaluation (Signed)
Anesthesia Evaluation  Patient identified by MRN, date of birth, ID band Patient awake    Reviewed: Allergy & Precautions, H&P , NPO status , Patient's Chart, lab work & pertinent test results  Airway       Dental   Pulmonary former smoker,          Cardiovascular     Neuro/Psych    GI/Hepatic   Endo/Other    Renal/GU      Musculoskeletal   Abdominal   Peds  Hematology   Anesthesia Other Findings   Reproductive/Obstetrics                           Anesthesia Physical Anesthesia Plan  ASA: II  Anesthesia Plan: General   Post-op Pain Management:    Induction: Intravenous  Airway Management Planned: LMA  Additional Equipment:   Intra-op Plan:   Post-operative Plan: Extubation in OR  Informed Consent: I have reviewed the patients History and Physical, chart, labs and discussed the procedure including the risks, benefits and alternatives for the proposed anesthesia with the patient or authorized representative who has indicated his/her understanding and acceptance.     Plan Discussed with: CRNA, Anesthesiologist and Surgeon  Anesthesia Plan Comments:         Anesthesia Quick Evaluation

## 2013-09-13 NOTE — Interval H&P Note (Signed)
History and Physical Interval Note:  09/13/2013 7:28 AM  Ann Newton  has presented today for surgery, with the diagnosis of Left Foot: Morton's Neuroma  The various methods of treatment have been discussed with the patient and family. After consideration of risks, benefits and other options for treatment, the patient has consented to  Procedure(s): LEFT FOOT: EXCISION INTERDIGITAL MORTON'S NEUROMA SINGLE EACH (Left) as a surgical intervention .  The patient's history has been reviewed, patient examined, no change in status, stable for surgery.  I have reviewed the patient's chart and labs.  Questions were answered to the patient's satisfaction.     Vickee Mormino JR,W D

## 2013-09-13 NOTE — Brief Op Note (Signed)
09/13/2013  11:04 AM  PATIENT:  Ann Newton  58 y.o. female  PRE-OPERATIVE DIAGNOSIS:  Left Foot: Morton's Neuroma  POST-OPERATIVE DIAGNOSIS:  left foot morton's neuroma  PROCEDURE:  Procedure(s): LEFT FOOT: EXCISION INTERDIGITAL MORTON'S NEUROMA SINGLE EACH (Left)  SURGEON:  Surgeon(s) and Role:    * W D Carloyn Manner., MD - Primary  PHYSICIAN ASSISTANT: Margart Sickles, PA-C  ASSISTANTS:    ANESTHESIA:   local and general  EBL:  Total I/O In: 1300 [I.V.:1300] Out: -   BLOOD ADMINISTERED:none  DRAINS: none   LOCAL MEDICATIONS USED:  MARCAINE     SPECIMEN:  Source of Specimen:  digital nerve left foot 3rd-4th webspace and Excision  DISPOSITION OF SPECIMEN:  PATHOLOGY  COUNTS:  YES  TOURNIQUET:   Total Tourniquet Time Documented: Calf (Left) - 32 minutes Total: Calf (Left) - 32 minutes   DICTATION: .Other Dictation: Dictation Number unknown  PLAN OF CARE: Discharge to home after PACU  PATIENT DISPOSITION:  PACU - hemodynamically stable.   Delay start of Pharmacological VTE agent (>24hrs) due to surgical blood loss or risk of bleeding: not applicable

## 2013-09-13 NOTE — Discharge Instructions (Signed)
Diet: As you were doing prior to hospitalization   Activity: Increase activity slowly as tolerated  No lifting or driving for 2 weeks   Shower: may remove dressing 48-72 hours following surgery, ok to shower, NO SOAKING in tub try not to submerge foot  Dressing: You may change your dressing on post op day #2.  Then change the dressing daily with sterile 4"x4"s gauze dressing  Wrap with some compression around the foot/toes with ace wrap    Weight Bearing: weight bearing as tolerated in boot/shoe. Use a walker or  Crutches as instructed.   To prevent constipation: you may use a stool softener such as -  Colace ( over the counter) 100 mg by mouth twice a day  Drink plenty of fluids ( prune juice may be helpful) and high fiber foods  Miralax ( over the counter) for constipation as needed.   Precautions: If you experience chest pain or shortness of breath - call 911 immediately For transfer to the hospital emergency department!!  If you develop a fever greater that 101 F, purulent drainage from wound, increased redness or drainage from wound, or calf pain -- Call the office   Follow- Up Appointment: Please call for an appointment to be seen in 2 weeks  [place] - [phone]    Post Anesthesia Home Care Instructions  Activity: Get plenty of rest for the remainder of the day. A responsible adult should stay with you for 24 hours following the procedure.  For the next 24 hours, DO NOT: -Drive a car -Advertising copywriter -Drink alcoholic beverages -Take any medication unless instructed by your physician -Make any legal decisions or sign important papers.  Meals: Start with liquid foods such as gelatin or soup. Progress to regular foods as tolerated. Avoid greasy, spicy, heavy foods. If nausea and/or vomiting occur, drink only clear liquids until the nausea and/or vomiting subsides. Call your physician if vomiting continues.  Special Instructions/Symptoms: Your throat may feel dry  or sore from the anesthesia or the breathing tube placed in your throat during surgery. If this causes discomfort, gargle with warm salt water. The discomfort should disappear within 24 hours.

## 2013-09-13 NOTE — Transfer of Care (Signed)
Immediate Anesthesia Transfer of Care Note  Patient: Ann Newton  Procedure(s) Performed: Procedure(s): LEFT FOOT: EXCISION INTERDIGITAL MORTON'S NEUROMA SINGLE EACH (Left)  Patient Location: PACU  Anesthesia Type:General  Level of Consciousness: sedated  Airway & Oxygen Therapy: Patient Spontanous Breathing and Patient connected to face mask oxygen  Post-op Assessment: Report given to PACU RN and Post -op Vital signs reviewed and stable  Post vital signs: Reviewed and stable  Complications: No apparent anesthesia complications

## 2013-09-13 NOTE — H&P (View-Only) (Signed)
Ann Newton is an 58 y.o. female.   Chief Complaint: Left foot mortons neuroma  HPI: The patient is a 58 year old female known to the office who presents with six months of increasing left foot pain.  She states she has psoriasis on the heel of her feet and for a long time did walk more on the ball of her foot.  She is describing the pain more between the third and fourth toe. She also notices a separation between those toes as well as some numbness along the third and fourth toe. We had injected the area without relief.  We had obtained MRI which was negative for any stress reaction or fractures.  She states unable to wear regular shoe for any period of time and has increased pain and numbness in the foot mainly between 3rd-4th toes.    Past Medical History  Diagnosis Date  . Arthritis   . Wears contact lenses     Past Surgical History  Procedure Laterality Date  . Cosmetic surgery  1999    breast implants  . Knee arthroscopy  2013,2011    right/left  . Shoulder arthroscopy      rt  . Abdominal hysterectomy  2000    part  . Colonoscopy    . Tubal ligation      Family History  Problem Relation Age of Onset  . Kidney disease Mother   . Heart attack Father   . Arthritis Brother   . Heart attack Maternal Grandmother   . Heart attack Maternal Grandfather   . Cancer Paternal Grandmother   . Heart attack Paternal Grandfather    Social History:  reports that she quit smoking about a year ago. She does not have any smokeless tobacco history on file. She reports that she does not drink alcohol or use illicit drugs.  Allergies: No Known Allergies   (Not in a hospital admission)  No results found for this or any previous visit (from the past 48 hour(s)). No results found.  Review of Systems  Constitutional: Negative.   HENT: Negative.   Eyes: Negative.   Respiratory: Negative.   Cardiovascular: Negative.   Gastrointestinal: Negative.   Genitourinary: Negative.    Musculoskeletal: Positive for myalgias.  Skin: Positive for itching and rash.  Neurological: Positive for tingling. Negative for dizziness, tremors, sensory change, speech change, focal weakness, seizures and loss of consciousness.  Endo/Heme/Allergies: Negative.   Psychiatric/Behavioral: Negative.     There were no vitals taken for this visit. Physical Exam  Constitutional: She is oriented to person, place, and time. She appears well-developed and well-nourished. No distress.  HENT:  Head: Normocephalic and atraumatic.  Nose: Nose normal.  Eyes: Conjunctivae and EOM are normal. Pupils are equal, round, and reactive to light.  Neck: Normal range of motion. Neck supple.  Cardiovascular: Normal rate and intact distal pulses.   Respiratory: Effort normal. No respiratory distress. She has no wheezes.  GI: Soft. She exhibits no distension. There is no tenderness.  Musculoskeletal:  Left foot- TTP between 3rd-4th webspace, N/T, gapping between 3rd-4th toe causing some over lap 2nd over 3rd toe.    Lymphadenopathy:    She has no cervical adenopathy.  Neurological: She is alert and oriented to person, place, and time. No cranial nerve deficit.  Skin: Skin is warm and dry. Rash noted. No erythema.  Diffuse psoriatic rash  Psychiatric: She has a normal mood and affect. Her behavior is normal.     Assessment/Plan Left foot Mortons Neuroma  We discussed risks and benefits of excision and patient wishes to proceed.  This will be done outpatient general anasthesia tomorrow.    Margart Sickles 09/12/2013, 1:06 PM

## 2013-09-13 NOTE — Anesthesia Procedure Notes (Signed)
Procedure Name: LMA Insertion Date/Time: 09/13/2013 10:16 AM Performed by: Burna Cash Pre-anesthesia Checklist: Patient identified, Emergency Drugs available, Suction available and Patient being monitored Patient Re-evaluated:Patient Re-evaluated prior to inductionOxygen Delivery Method: Circle System Utilized Preoxygenation: Pre-oxygenation with 100% oxygen Intubation Type: IV induction Ventilation: Mask ventilation without difficulty LMA: LMA inserted LMA Size: 4.0 Number of attempts: 1 Airway Equipment and Method: bite block Placement Confirmation: positive ETCO2 Tube secured with: Tape Dental Injury: Teeth and Oropharynx as per pre-operative assessment

## 2013-09-16 ENCOUNTER — Encounter (HOSPITAL_BASED_OUTPATIENT_CLINIC_OR_DEPARTMENT_OTHER): Payer: Self-pay | Admitting: Orthopedic Surgery

## 2013-09-16 NOTE — Op Note (Signed)
NAME:  Ann Newton, Ann Newton NO.:  [id number]  MEDICAL RECORD NO.:  [id number]  LOCATION:                                 FACILITY:  PHYSICIAN:  Dyke Brackett, M.D.    DATE OF BIRTH:  02-Jan-1956  DATE OF PROCEDURE:  09/13/2013 DATE OF DISCHARGE:  09/13/2013                              OPERATIVE REPORT   PREOPERATIVE DIAGNOSIS:  Morton's neuroma, left foot (3rd and 4th interspace).  POSTOPERATIVE DIAGNOSIS:  Morton's neuroma, left foot (3rd and 4th interspace).  OPERATION:  Incision of Morton's neuroma, 3rd and 4th interspace left foot.  SURGEON:  Dyke Brackett, M.D.  ASSISTANT:  Margart Sickles, PA-C.  ANESTHESIA:  General with local supplementation.  DESCRIPTION OF PROCEDURE:  General anesthetic, supine positioning and calf tourniquet 250 exsanguination, incision was made between the 3rd and 4th interspace in the metatarsal, dissection carried down through the superficial subcutaneous tissues with coagulation of veins.  We retracted the lumbrical muscle. We incised the transverse metacarpal ligament, identified the common digital nerve and its branches.  There was moderate swelling, particularly on the side towards the 4th toe consistent with Morton's neuroma.  Nerve was excised with care being made to dissect it proximally enough to leave no stump that could form a neuroma.  Wound was irrigated.  We closed the wound with interrupted 2-0 Vicryl and 4-0 nylon, infiltrated the skin with plain Marcaine.  Lightly compressive sterile dressing was applied with a equalizer boot. Tourniquet was released after application of dressing. Taken to recovery room in stable condition.     Dyke Brackett, M.D.     WDC/MEDQ  D:  09/13/2013  T:  09/14/2013  Job:  [id number]

## 2014-04-25 ENCOUNTER — Other Ambulatory Visit: Payer: Self-pay | Admitting: Dermatology

## 2015-03-23 ENCOUNTER — Other Ambulatory Visit: Payer: Self-pay | Admitting: Endocrinology

## 2015-03-23 DIAGNOSIS — E049 Nontoxic goiter, unspecified: Secondary | ICD-10-CM

## 2015-03-24 ENCOUNTER — Ambulatory Visit
Admission: RE | Admit: 2015-03-24 | Discharge: 2015-03-24 | Disposition: A | Discharge status: Home / Self Care | Payer: 59 | Source: Ambulatory Visit | Attending: Endocrinology | Admitting: Endocrinology

## 2015-03-24 DIAGNOSIS — E049 Nontoxic goiter, unspecified: Secondary | ICD-10-CM

## 2015-03-24 IMAGING — US US SOFT TISSUE HEAD/NECK
1 series · 14 of 25 positions shown · non-contrast
Comparison: None.

CLINICAL DATA: Goiter on physical exam

EXAM:
THYROID ULTRASOUND
TECHNIQUE: Ultrasound examination of the thyroid gland and adjacent soft
tissues was performed.

[Series 1: us soft tissue head/neck · 0.10mm/px · 14 of 43 slices shown]
[im 1/43]
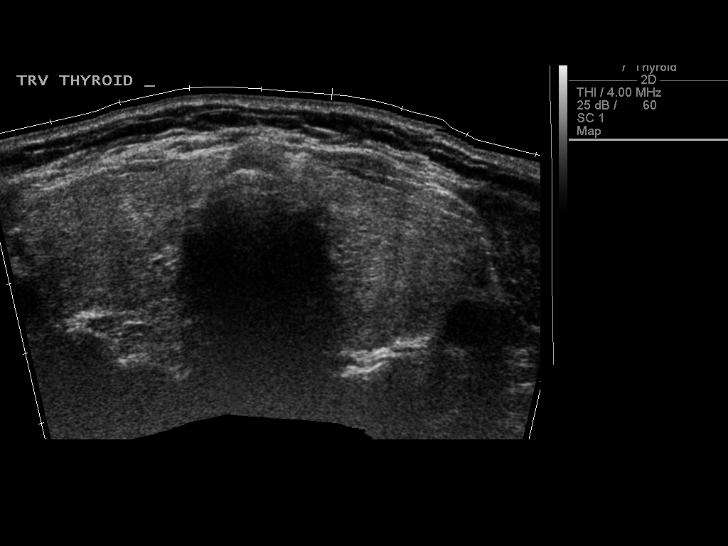
[im 4/43]
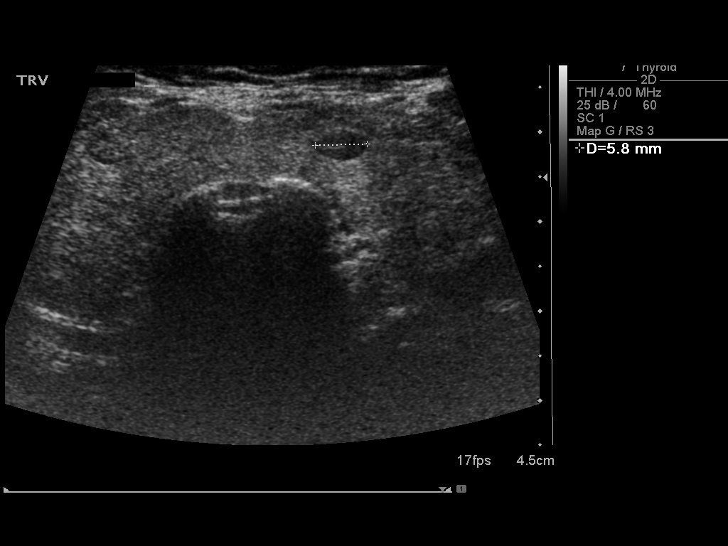
[im 8/43]
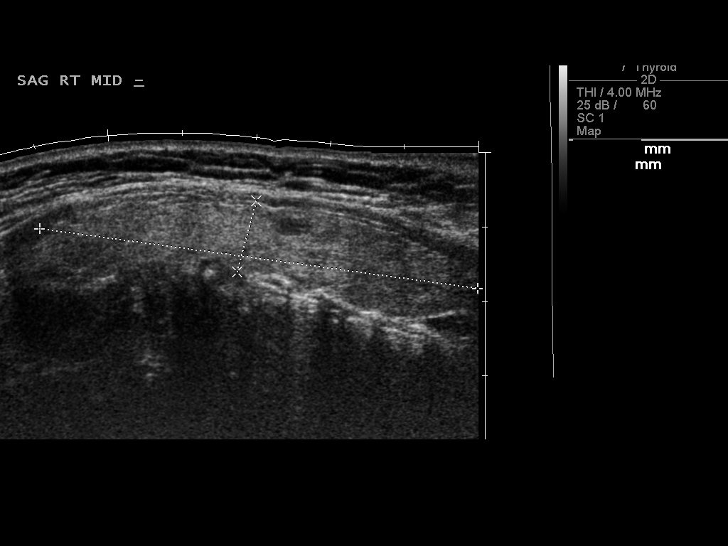
[im 11/43]
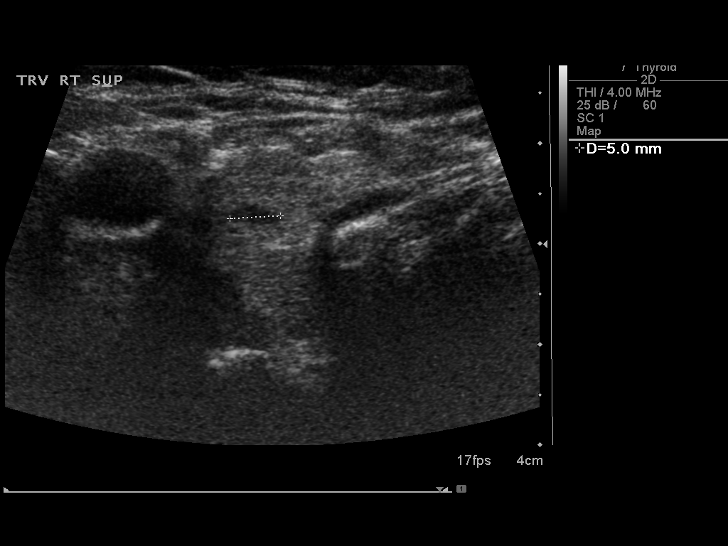
[im 15/43]
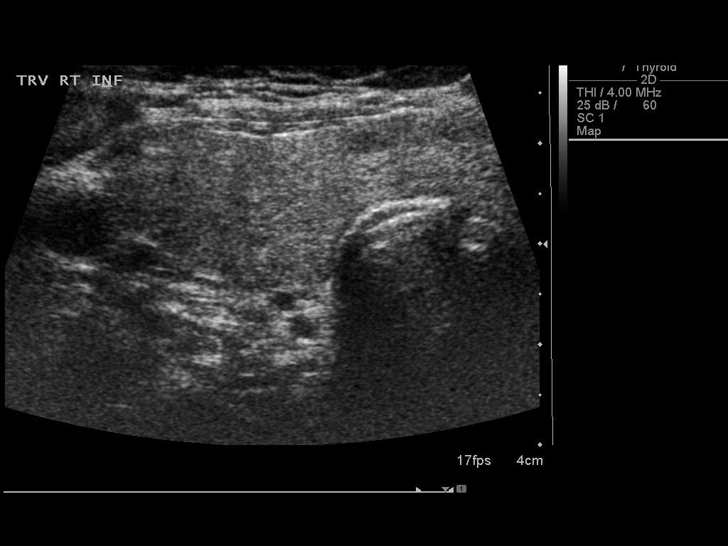
[im 16/43]
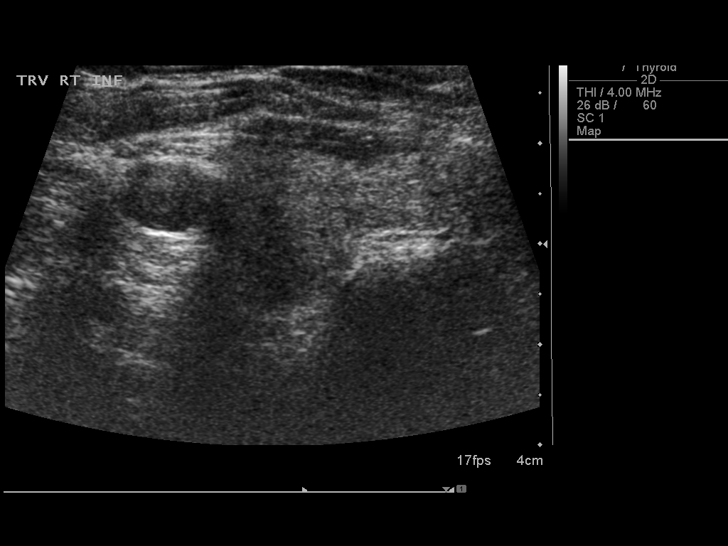
[im 20/43]
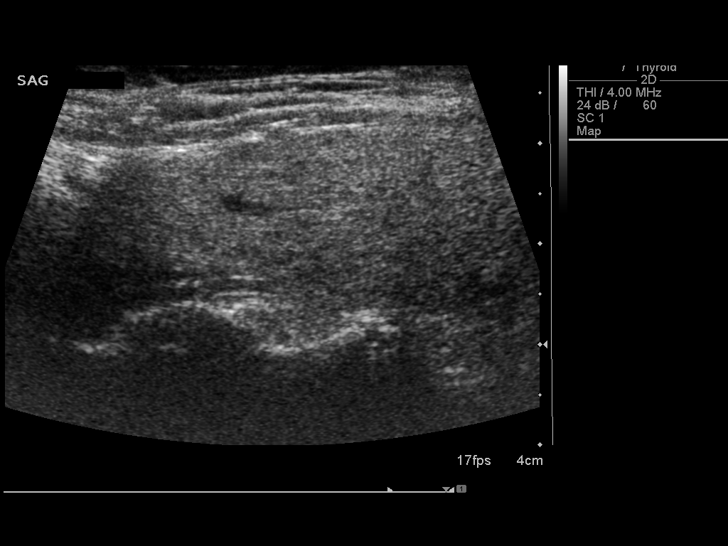
[im 23/43]
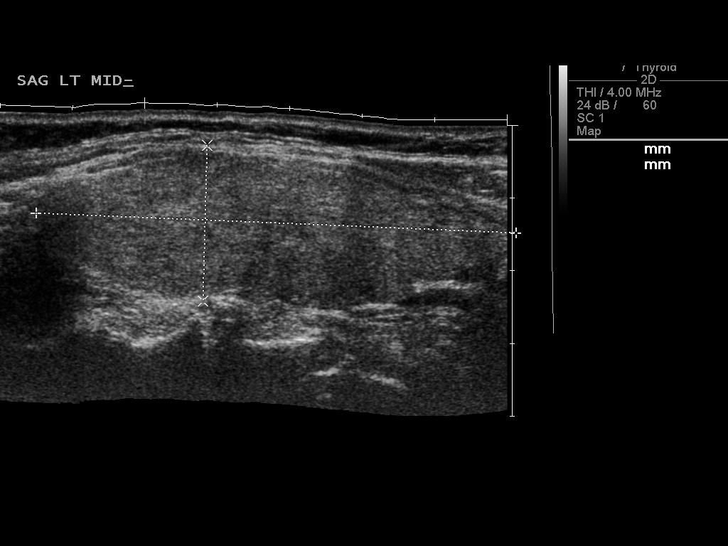
[im 27/43]
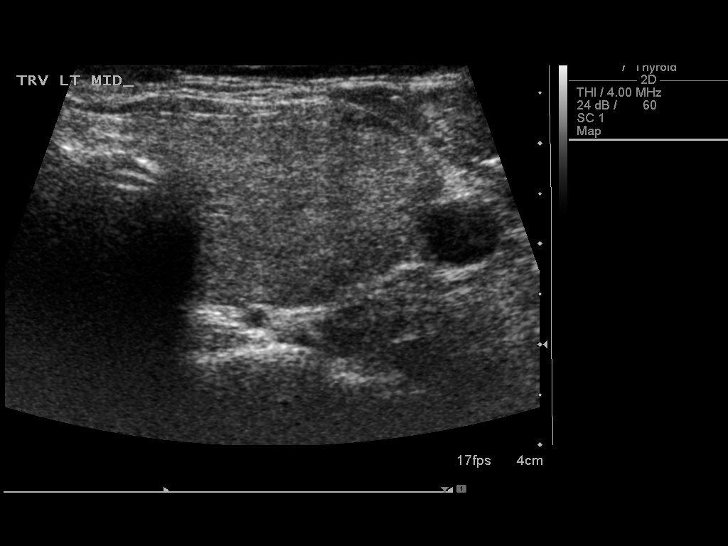
[im 29/43]
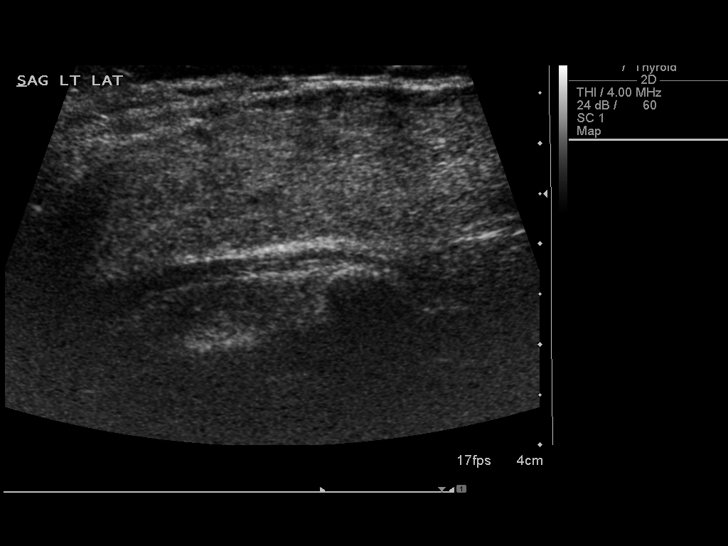
[im 32/43]
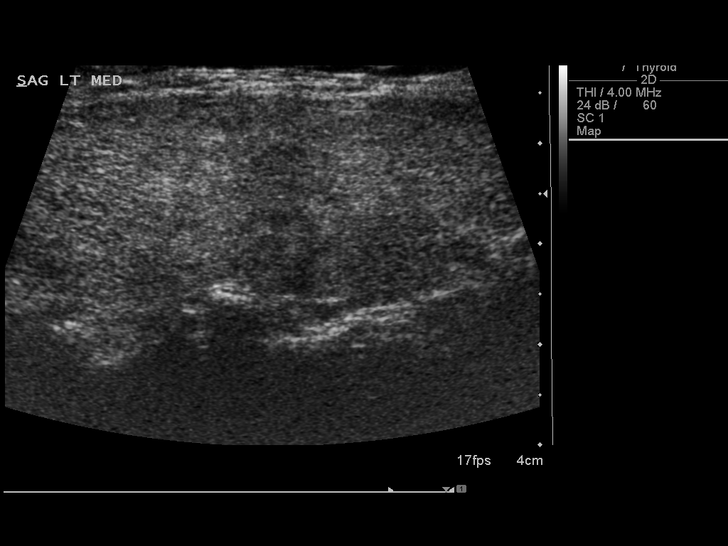
[im 36/43]
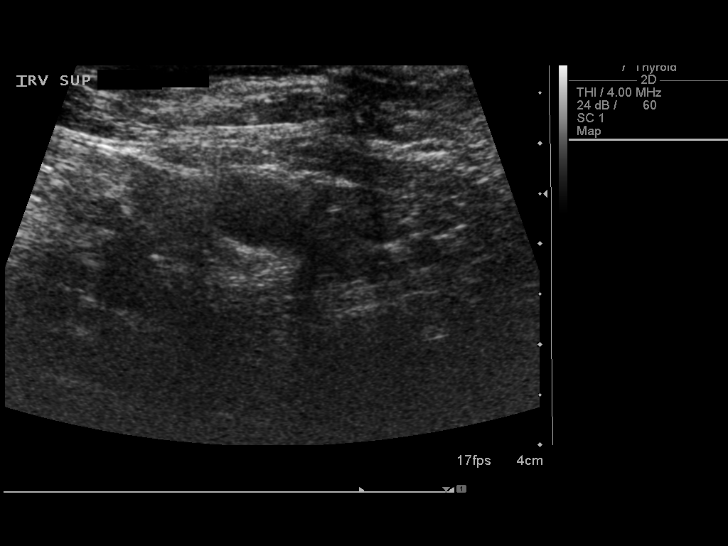
[im 39/43]
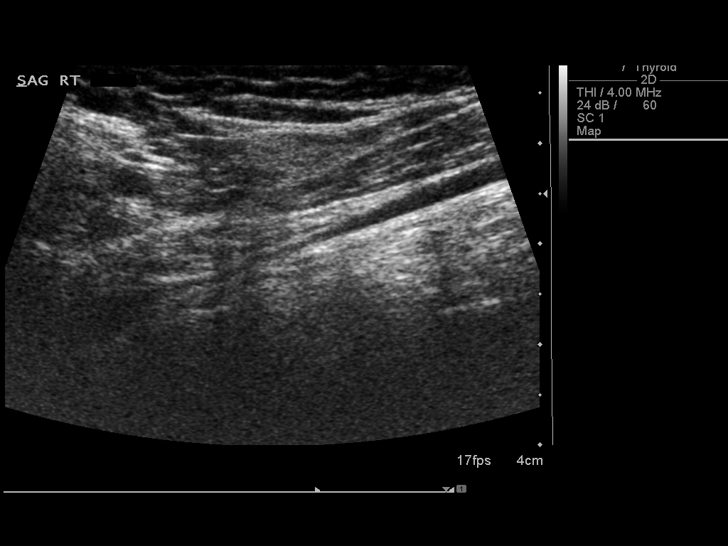
[im 43/43]
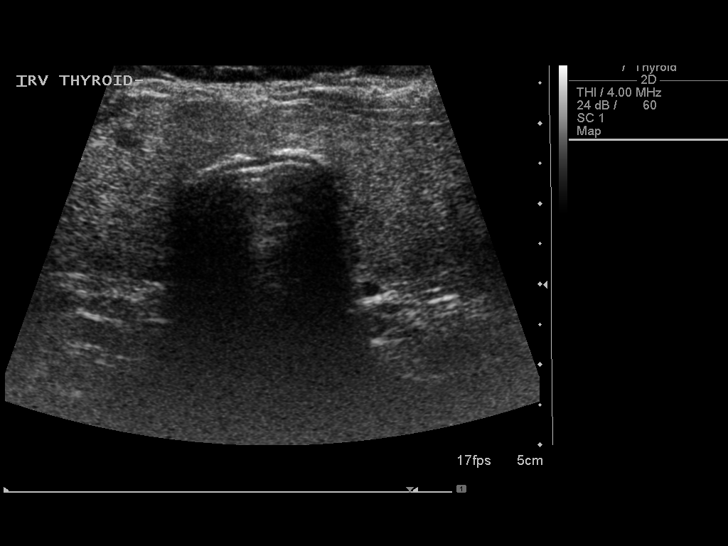

[14 of 25 positions shown; findings below may reference images not displayed]

FINDINGS: Right thyroid lobe

Measurements: 5.9 x 1 x 2.4 cm. 5 mm hypoechoic nodule, superior
pole. 4 mm hypoechoic nodule near isthmus.

Left thyroid lobe

Measurements: 6.6 x 2.1 x 2.5 cm.  No nodules visualized.

Isthmus

Thickness: 0.9 cm.  6 mm hypoechoic nodule, left of midline.

Lymphadenopathy

None visualized.
IMPRESSION: 1. Mild thyromegaly with small nodules as above. Findings do not
meet current consensus criteria for biopsy. Follow-up by clinical
exam is recommended. If patient has known risk factors for thyroid
carcinoma, consider follow-up ultrasound in 12 months. If patient is
clinically hyperthyroid, consider nuclear medicine thyroid uptake
and scan. This recommendation follows the consensus statement:
Management of Thyroid Nodules Detected as US: Society of
Radiologists in Ultrasound Consensus Conference Statement. Radiology

## 2015-03-26 ENCOUNTER — Other Ambulatory Visit: Payer: Self-pay

## 2015-03-27 LAB — HM MAMMOGRAPHY

## 2015-04-20 ENCOUNTER — Other Ambulatory Visit: Payer: Self-pay | Admitting: Family Medicine

## 2015-04-20 DIAGNOSIS — I739 Peripheral vascular disease, unspecified: Secondary | ICD-10-CM

## 2015-04-20 LAB — BASIC METABOLIC PANEL
BUN: 11 mg/dL (ref 4–21)
Creatinine: 0.8 mg/dL (ref <=1.1)
Glucose: 92 mg/dL
Potassium: 4.9 mmol/L (ref 3.4–5.3)
Sodium: 137 mmol/L (ref 137–147)

## 2015-04-20 LAB — HEPATIC FUNCTION PANEL
ALK PHOS: 73 U/L (ref 25–125)
ALT: 15 U/L (ref 7–35)
AST: 14 U/L (ref 13–35)
BILIRUBIN, TOTAL: 0.4 mg/dL

## 2015-04-20 LAB — LIPID PANEL
Cholesterol: 234 mg/dL — AB (ref 0–200)
HDL: 45 mg/dL (ref 35–70)
LDL Cholesterol: 157 mg/dL
TRIGLYCERIDES: 306 mg/dL — AB (ref 40–160)
Triglycerides: 160 mg/dL (ref 40–160)

## 2015-04-28 ENCOUNTER — Other Ambulatory Visit: Payer: 59

## 2015-05-04 ENCOUNTER — Ambulatory Visit
Admission: RE | Admit: 2015-05-04 | Discharge: 2015-05-04 | Disposition: A | Discharge status: Home / Self Care | Payer: 59 | Source: Ambulatory Visit | Attending: Family Medicine | Admitting: Family Medicine

## 2015-05-04 DIAGNOSIS — I739 Peripheral vascular disease, unspecified: Secondary | ICD-10-CM

## 2015-07-29 LAB — LIPID PANEL
Cholesterol: 234 mg/dL — AB (ref 0–200)
HDL: 45 mg/dL (ref 35–70)
LDL Cholesterol: 157 mg/dL
Triglycerides: 160 mg/dL (ref 40–160)

## 2015-09-15 ENCOUNTER — Ambulatory Visit (INDEPENDENT_AMBULATORY_CARE_PROVIDER_SITE_OTHER): Payer: 59 | Admitting: Physician Assistant

## 2015-09-15 ENCOUNTER — Encounter: Payer: Self-pay | Admitting: Physician Assistant

## 2015-09-15 VITALS — BP 130/82 | HR 88 | Temp 98.0°F | Resp 17 | Ht 68.0 in | Wt 157.0 lb

## 2015-09-15 DIAGNOSIS — R21 Rash and other nonspecific skin eruption: Secondary | ICD-10-CM | POA: Diagnosis not present

## 2015-09-15 LAB — POCT CBC
GRANULOCYTE PERCENT: 62.6 % (ref 37–80)
HEMATOCRIT: 39 % (ref 37.7–47.9)
HEMOGLOBIN: 13.7 g/dL (ref 12.2–16.2)
Lymph, poc: 2.9 (ref 0.6–3.4)
MCH: 31.8 pg — AB (ref 27–31.2)
MCHC: 35.3 g/dL (ref 31.8–35.4)
MCV: 90.1 fL (ref 80–97)
MID (cbc): 2.9 — AB (ref 0–0.9)
MPV: 7.8 fL (ref 0–99.8)
POC GRANULOCYTE: 5.4 (ref 2–6.9)
POC LYMPH PERCENT: 33.9 %L (ref 10–50)
POC MID %: 3.5 % (ref 0–12)
Platelet Count, POC: 446 10*3/uL — AB (ref 142–424)
RBC: 4.32 M/uL (ref 4.04–5.48)
RDW, POC: 13.2 %
WBC: 8.7 10*3/uL (ref 4.6–10.2)

## 2015-09-15 MED ORDER — HYDROXYZINE HCL 25 MG PO TABS: 25.0000 mg | ORAL_TABLET | Freq: Three times a day (TID) | ORAL | 0 refills | Status: DC

## 2015-09-15 MED ORDER — ACYCLOVIR 800 MG PO TABS: 800.0000 mg | ORAL_TABLET | Freq: Four times a day (QID) | ORAL | 0 refills | Status: AC

## 2015-09-15 NOTE — Progress Notes (Signed)
Ann Glasgowerri C Schnitzer  MRN: [id number] DOB: 01/13/1956  Subjective:  Ann Newton is a 60 y.o. female seen in office today for a chief complaint of rash x 3 days. The rash started on pt's right side an  has progressed to include her back, neck, trunk, and posterior knees bilaterally.Has associated itching and burning. Denies swelling, change in detergent, lotion, soaps, etc, tick exposure, plant exposure, hot tub exposure, and new medication use (besides Otezla, which she started two months ago). No one at home has a similar rash. She has tried OTC hydrocortisone and benedryl with temporary relief. Of note, [place] a lot of other individuals. She did not notice the lesions while at the beach. No one at the house has the same rash. Pt had a mild case of chicken pox when she was younger.   Review of Systems  Constitutional: Negative for fatigue and fever.  HENT: Negative for trouble swallowing.   Respiratory: Negative for shortness of breath and wheezing.   Musculoskeletal: Negative for arthralgias.  Allergic/Immunologic: Negative for environmental allergies and food allergies.    Patient Active Problem List   Diagnosis Date Noted  . Pustular psoriasis of palms and soles 10/28/2010    Current Outpatient Prescriptions on File Prior to Visit  Medication Sig Dispense Refill  . acyclovir (ZOVIRAX) 400 MG tablet Take 400 mg by mouth daily as needed.    Marland Kitchen. estradiol (ESTRACE) 1 MG tablet Take 1 mg by mouth daily.    . hydrochlorothiazide (HYDRODIURIL) 25 MG tablet Take 25 mg by mouth as needed.     . vitamin B-12 (CYANOCOBALAMIN) 1000 MCG tablet Take 2,500 mcg by mouth daily.    Marland Kitchen. oxyCODONE-acetaminophen (ROXICET) 5-325 MG per tablet Take 1-2 tablets by mouth every 4 (four) hours as needed for severe pain. (Patient not taking: Reported on 09/15/2015) 75 tablet 0   No current facility-administered medications on file prior to visit.     No Known  Allergies Social History   Social History  . Marital status: Married    Spouse name: N/A  . Number of children: N/A  . Years of education: N/A   Occupational History  . Not on file.   Social History Main Topics  . Smoking status: Former Smoker    Quit date: 09/12/2012  . Smokeless tobacco: Not on file  . Alcohol use No  . Drug use: No  . Sexual activity: Not on file   Other Topics Concern  . Not on file   Social History Narrative  . No narrative on file    Objective:  BP 130/82 (BP Location: Left Arm, Patient Position: Sitting, Cuff Size: Large)   Pulse 88   Temp 98 F (36.7 C) (Oral)   Resp 17   Ht 5\' 8"  (1.727 m)   Wt 157 lb (71.2 kg)   SpO2 100%   BMI 23.87 kg/m   Physical Exam  Constitutional: She is oriented to person, place, and time and well-developed, well-nourished, and in no distress.  HENT:  Head: Normocephalic and atraumatic.  Mouth/Throat: Oropharynx is clear and moist and mucous membranes are normal.  Eyes: Conjunctivae are normal.  Neck: Normal range of motion.  Pulmonary/Chest: Effort normal.  Neurological: She is alert and oriented to person, place, and time. Gait normal.  Skin:  Diffuse 1-2 mm mix of papular and vesicular lesions on an erythematic base on anterior/lateral/posterior trunk, superior buttocks, neck, and posterior knees bilaterally. Most concentrated on  posterior trunk.   Psychiatric: Affect normal.  Vitals reviewed.  Results for orders placed or performed in visit on 09/15/15 (from the past 24 hour(s))  POCT CBC     Status: Abnormal   Collection Time: 09/15/15 11:31 AM  Result Value Ref Range   WBC 8.7 4.6 - 10.2 K/uL   Lymph, poc 2.9 0.6 - 3.4   POC LYMPH PERCENT 33.9 10 - 50 %L   MID (cbc) 2.9 (A) 0 - 0.9   POC MID % 3.5 0 - 12 %M   POC Granulocyte 5.4 2 - 6.9   Granulocyte percent 62.6 37 - 80 %G   RBC 4.32 4.04 - 5.48 M/uL   Hemoglobin 13.7 12.2 - 16.2 g/dL   HCT, POC 11.9 14.7 - 47.9 %   MCV 90.1 80 - 97 fL   MCH,  POC 31.8 (A) 27 - 31.2 pg   MCHC 35.3 31.8 - 35.4 g/dL   RDW, POC 82.9 %   Platelet Count, POC 446 (A) 142 - 424 K/uL   MPV 7.8 0 - 99.8 fL    Assessment and Plan :  1. Rash and nonspecific skin eruption -History of physical exam consistent with varicella, will treat accordingly - Varicella zoster antibody, IgG - Varicella zoster antibody, IgM - WOUND CULTURE - hydrOXYzine (ATARAX/VISTARIL) 25 MG tablet; Take 1 tablet (25 mg total) by mouth 3 (three) times daily. As needed for itching  Dispense: 60 tablet; Refill: 0 - POCT CBC - acyclovir (ZOVIRAX) 800 MG tablet; Take 1 tablet (800 mg total) by mouth 4 (four) times daily.  Dispense: 20 tablet; Refill: 0 -Return to clinic if symptoms worsen, do not improve, or as needed  Benjiman Core PA-C  [facility] 09/15/2015 11:39 AM

## 2015-09-15 NOTE — Patient Instructions (Addendum)
Chickenpox, Adult Chickenpox is an illness caused by a virus. Chickenpox can be very serious for adults. Adults have a higher risk for complications than children. Complications of chickenpox include:  Pneumonia.  Skin infection.  Bone infection (osteomyelitis).  Joint infection (septic arthritis).  Brain infection (encephalitis).  Toxic shock syndrome.  Bleeding problems.  Problems with balance and muscle control (cerebellar ataxia).  Having a baby with a birth defect, if you are pregnant.  Death. If you have had chickenpox once, you probably will not get it again. If you are exposed to chickenpox and have never had the illness or the vaccine, you may develop the illness within 21 days. CAUSES  Chickenpox is caused by a virus. This virus spreads easily from one person to another through the tiny droplets released into the air when an infected person coughs or sneezes. It also spreads through the fluid produced by the chickenpox rash. RISK FACTORS People who have never had chickenpox and have never been vaccinated are at risk. You may also be at greater risk for chickenpox if:  You are a health care worker.  You are a Archivist.  You are in the [employer].  You live in an institution.  You are a Runner, broadcasting/film/video.  You have poor resistance to infection. SIGNS AND SYMPTOMS   Rash. The rash is made up of itchy blisters that heal over with a crusty scab. The rash may appear a day or two after other symptoms.  Body aches and pain.  Headache.  Irritability.  Tiredness.  Fever.  Sore throat. DIAGNOSIS  Your health care provider will make a diagnosis based on your symptoms. You may have a blood test to confirm the diagnosis. TREATMENT  Treatments may include:  Taking medicine to shorten how long the illness lasts and to reduce its severity.  Applying calamine lotion to relieve itchiness.  Using baking soda or oatmeal baths to soothe itchy skin. Ask your health care  provider if you may use a type of over-the-counter medicine called an antihistamine to reduce itching. HOME CARE INSTRUCTIONS   Take medicines only as directed by your health care provider.  Try taking a bath in lukewarm water every few hours. Add several tablespoons of baking soda or oatmeal to the water to make the bath more soothing. Do not bathe in hot water.  Apply an ice pack or a cold washcloth to the rash to relieve itching.  Wash your hands often. This lowers your chance of getting a bacterial skin infection and passing the virus to others.  Do not eat or drink spicy, salty, or acidic things if you have blisters in your mouth. Soft, bland, and cold foods and beverages will be easiest to swallow.  Do not be around:  People who have not had chickenpox and have not been vaccinated against it.  People with a weakened immune system, including those with HIV, AIDS, or cancer, those who have had a transplant or are on chemotherapy, and those who take medicines that weaken the immune system.  Pregnant women.  If a person is exposed to your chickenpox and has not had it before or been vaccinated against it, has a weakened immune system, or is pregnant, he or she should call a health care provider. SEEK IMMEDIATE MEDICAL CARE IF:   You have trouble breathing.  You have a severe headache.  You have a stiff neck.  You have severe joint pain or stiffness.  You feel disoriented or confused.  You have trouble  walking or keeping your balance.  You have a fever and your symptoms suddenly get worse.  The area around one of the blisters leaks pus or becomes very red, hot to the touch, or painful. MAKE SURE YOU:  Understand these instructions.  Will watch your condition.  Will get help right away if you are not doing well or get worse.   This information is not intended to replace advice given to you by your health care provider. Make sure you discuss any questions you have with  your health care provider.   Document Released: 10/13/2007 Document Revised: 01/24/2014 Document Reviewed: 11/27/2012 Elsevier Interactive Patient Education 2016 [employer]  Transmission Varicella is highly contagious. The virus can be spread from person to person by direct contact, inhalation of aerosols from vesicular fluid of skin lesions of acute varicella or zoster and possibly through infected respiratory secretions that also may be aerosolized. A person with varicella is contagious from 1-2 days before rash onset until the lesions have crusted. It takes from 10-21 days after exposure to the virus for someone to develop varicella. Based on studies of transmission among household members, about 90% of susceptible close contacts will get varicella after exposure to persons with disease.  People with breakthrough varicella are contagious. One study of varicella transmission in household settings found that persons with mild breakthrough varicella (< 50 lesions) were one third as contagious as unvaccinated persons with varicella. However, persons with breakthrough varicella with 50 or more lesions were just as contagious as unvaccinated persons.   IF you received an x-ray today, you will receive an invoice from [facility]. Please contact [facility] at [phone] with questions or concerns regarding your invoice.   IF you received labwork today, you will receive an invoice from [facility]. Please contact [facility] at [phone] with questions or concerns regarding your invoice.   Our billing staff will not be able to assist you with questions regarding bills from these companies.  You will be contacted with the lab results as soon as they are available. The fastest way to get your results is to activate your My Chart account. Instructions are located on the last page of this paperwork. If you have not heard from us regarding the results in 2  weeks, please contact this office.    We recommend that you schedule a mammogram for breast cancer screening. Typically, you do not need a referral to do this. Please contact a local imaging center to schedule your mammogram.  [facility] - [phone]  *ask for the Radiology Department [facility] - [phone] or [phone]  [facility] - [phone] [facility] - [phone] [facility] - [phone]  *ask for the Radiology Department [facility] - [phone]  *ask for the Radiology Department [facility] - [phone]) [phone]  *ask for the Mammography Department [facility] - [phone]) [phone]

## 2015-09-16 LAB — VARICELLA ZOSTER ANTIBODY, IGG: VARICELLA IGG: 185.5 {index} — AB (ref <135.00)

## 2015-09-17 LAB — WOUND CULTURE
Gram Stain: NONE SEEN
Gram Stain: NONE SEEN
Organism ID, Bacteria: NO GROWTH

## 2015-09-18 LAB — VARICELLA ZOSTER ANTIBODY, IGM

## 2015-09-23 ENCOUNTER — Encounter: Payer: Self-pay | Admitting: Physician Assistant

## 2015-09-23 NOTE — Progress Notes (Signed)
Please send letter informing patient of her lab results. Thanks!

## 2015-10-14 ENCOUNTER — Other Ambulatory Visit: Payer: Self-pay | Admitting: Physician Assistant

## 2015-10-14 DIAGNOSIS — R21 Rash and other nonspecific skin eruption: Secondary | ICD-10-CM

## 2015-10-16 NOTE — Telephone Encounter (Signed)
Please refill.

## 2015-10-16 NOTE — Telephone Encounter (Signed)
I called the patient to confirm that  she requested a refill and she states yes, she still has a few lingering areas of rash  on her back that still itch but have improved greatly.She denies any new rash areas or symptoms. She is going out of town on Sunday for a week  and would like a refill before then so she can treat the itch.  Please advise

## 2015-12-03 ENCOUNTER — Ambulatory Visit (INDEPENDENT_AMBULATORY_CARE_PROVIDER_SITE_OTHER): Payer: 59 | Admitting: Family Medicine

## 2015-12-03 ENCOUNTER — Encounter: Payer: Self-pay | Admitting: Family Medicine

## 2015-12-03 VITALS — BP 106/68 | HR 83 | Ht 68.0 in | Wt 159.3 lb

## 2015-12-03 DIAGNOSIS — E785 Hyperlipidemia, unspecified: Secondary | ICD-10-CM | POA: Insufficient documentation

## 2015-12-03 DIAGNOSIS — E782 Mixed hyperlipidemia: Secondary | ICD-10-CM | POA: Diagnosis not present

## 2015-12-03 DIAGNOSIS — E663 Overweight: Secondary | ICD-10-CM | POA: Diagnosis not present

## 2015-12-03 DIAGNOSIS — E781 Pure hyperglyceridemia: Secondary | ICD-10-CM

## 2015-12-03 DIAGNOSIS — Z23 Encounter for immunization: Secondary | ICD-10-CM | POA: Diagnosis not present

## 2015-12-03 DIAGNOSIS — R600 Localized edema: Secondary | ICD-10-CM

## 2015-12-03 DIAGNOSIS — Z6827 Body mass index (BMI) 27.0-27.9, adult: Secondary | ICD-10-CM | POA: Insufficient documentation

## 2015-12-03 DIAGNOSIS — R609 Edema, unspecified: Secondary | ICD-10-CM

## 2015-12-03 DIAGNOSIS — Z1389 Encounter for screening for other disorder: Secondary | ICD-10-CM

## 2015-12-03 DIAGNOSIS — Z6828 Body mass index (BMI) 28.0-28.9, adult: Secondary | ICD-10-CM | POA: Insufficient documentation

## 2015-12-03 DIAGNOSIS — E538 Deficiency of other specified B group vitamins: Secondary | ICD-10-CM

## 2015-12-03 DIAGNOSIS — Z6829 Body mass index (BMI) 29.0-29.9, adult: Secondary | ICD-10-CM | POA: Insufficient documentation

## 2015-12-03 DIAGNOSIS — Z8619 Personal history of other infectious and parasitic diseases: Secondary | ICD-10-CM

## 2015-12-03 DIAGNOSIS — L403 Pustulosis palmaris et plantaris: Secondary | ICD-10-CM

## 2015-12-03 NOTE — Progress Notes (Signed)
New patient office visit note:  Impression and Recommendations:    1. Mixed hyperlipidemia   2. h/o Hypertriglyceridemia   3. Recent h/o being Overweight   4. h/o Mild peripheral edema   5. Need for prophylactic vaccination and inoculation against influenza   6. Pustular psoriasis of palms and soles   7. Dietary B12 deficiency   8. History of chickenpox ( Aug '17 )    9. Screening for multiple conditions     h/o being Overweight Uses phenteramine 3 months/ year or so to keep wt off..   h/o Mild peripheral edema Patient denies history of hypertension- takes thiazide diuretic for fluid accumulation in her bilateral lower extremities.  Pustular psoriasis of palms and soles Treatment per [facility] dermatology department  Dietary B12 deficiency supp daily  Screening for multiple conditions - Patient reports normal colonoscopy 2011  - Mammogram and Pap smear March 2017:N   - Tdap: January 2011  History of chickenpox ( Aug '17 )  I will send patient in my chart message stating that the federal government's Advisory Committee on Immunization Practices (ACIP) and hence the CDC does not provide clear guidelines at this time regarding vaccination in someone her age with her medical history.   We physicians make recommendations to our patients based on the CURRENT evidence in front of [name]- and unfortunately there is just not enough evidence for me to recommend Zostavax ( Merck) at this time,  versus Shingrix (GlaxoSmithKline) vaccine for someone her age right now.     There is a lot of new information that recently came out in October 2017 and hence the 2018 guidelines may be different but my recommendation at this time would be for her to wait till she 60 years old to get any type of shingles vaccine.   I also recommend you contact your medical insurance company to ensure their coverage and see what your cost would be for this.  Since vaccination  will only decrease symptoms of herpes zoster in approximately 50-60% of the patient's anyhow, I encourage you to have a detailed discussion about the risks and benefits of these medications with the manufacturers about other drugs ( Merck or [facility] ) as well as the [employer], so you can make the best informed decision that is right for you.      Orders Placed This Encounter  Procedures  . Flu Vaccine QUAD 36+ mos IM  . COMPLETE METABOLIC PANEL WITH GFR  . CBC with Differential/Platelet  . Hemoglobin A1c  . Lipid panel  . T4, free  . TSH  . Vitamin B12  . VITAMIN D 25 Hydroxy (Vit-D Deficiency, Fractures)      New Prescriptions   No medications on file    Modified Medications   No medications on file    Discontinued Medications   FENOFIBRATE 160 MG TABLET    Take 160 mg by mouth daily.   OXYCODONE-ACETAMINOPHEN (ROXICET) 5-325 MG PER TABLET    Take 1-2 tablets by mouth every 4 (four) hours as needed for severe pain.    Patient's Medications  New Prescriptions   No medications on file  Previous Medications   ACYCLOVIR (ZOVIRAX) 400 MG TABLET    Take 400 mg by mouth daily as needed.   APREMILAST (OTEZLA) 30 MG TABS    Take 1 tablet by mouth 2 (two) times daily.    BIOTIN 2500 MCG CAPS    Take by mouth.  CALCIPOTRIENE (DOVONOX) 0.005 % CREAM    Apply topically 2 (two) times daily.   CLOBETASOL CREAM (TEMOVATE) 0.05 %    Apply 1 application topically 2 (two) times daily.   ESTRADIOL (ESTRACE) 1 MG TABLET    Take 1 mg by mouth 3 (three) times daily.    HYDROCHLOROTHIAZIDE (HYDRODIURIL) 25 MG TABLET    Take 25 mg by mouth as needed.    HYDROXYZINE (ATARAX/VISTARIL) 25 MG TABLET    TAKE 1 TABLET BY MOUTH 3 TIMES DAILY AS NEEDED FOR ITCHING   PHENTERMINE (ADIPEX-P) 37.5 MG TABLET       VITAMIN B-12 (CYANOCOBALAMIN) 1000 MCG TABLET    Take 2,500 mcg by mouth daily.  Modified Medications   No medications on file  Discontinued Medications   FENOFIBRATE 160 MG TABLET    Take  160 mg by mouth daily.   OXYCODONE-ACETAMINOPHEN (ROXICET) 5-325 MG PER TABLET    Take 1-2 tablets by mouth every 4 (four) hours as needed for severe pain.    Return for couple weeks to discuss bldwrk as desired; o/w 53mo.  The patient was counseled, risk factors were discussed, anticipatory guidance given.  Gross side effects, risk and benefits, and alternatives of medications discussed with patient.  Patient is aware that all medications have potential side effects and we are unable to predict every side effect or drug-drug interaction that may occur.  Expresses verbal understanding and consents to current therapy plan and treatment regimen.  Please see AVS handed out to patient at the end of our visit for further patient instructions/ counseling done pertaining to today's office visit.    Note: This document was prepared using Dragon voice recognition software and may include unintentional dictation errors.  ----------------------------------------------------------------------------------------------------------------------    Subjective:    Chief Complaint  Patient presents with  . Establish Care    HPI: Ann Newton is a pleasant 60 y.o. female who [facility] to review their medical history with me and establish care.   I asked the patient to review their chronic problem list with me to ensure everything was updated and accurate.     Husband comes here and diff to get in w Dr Clelia CroftShaw.   Prior PCP--> Dr Arvilla Marketassiano and Clelia CroftShaw last couple decades.  Smoked for 15 yrs- quit 2 yrs ago. - 3/4ppd-->  Roughly a 10 pk yr hx   No exercise regularly;    works from home- for [employer]    Patient Care Team    Relationship Specialty Notifications Start End  Thomasene Loteborah Gabe Glace, DO PCP - General Family Medicine  11/03/15   Frederico Hammananiel Caffrey, MD Consulting Physician Orthopedic Surgery  11/03/15   Donzetta Starchrew Jones, MD Consulting Physician  Dermatology  11/03/15   Olivia Mackieichard Taavon, MD Consulting Physician Obstetrics and Gynecology  11/03/15   Osborn Cohoavid Shoemaker, MD Consulting Physician Otolaryngology  11/03/15   Lorette AngAlan B Fleischer, MD Consulting Physician Dermatology  12/20/15    Comment: [facility]     Wt Readings from Last 3 Encounters:  12/03/15 159 lb 4.8 oz (72.3 kg)  09/15/15 157 lb (71.2 kg)  09/13/13 173 lb (78.5 kg)   BP Readings from Last 3 Encounters:  12/03/15 106/68  09/15/15 130/82  09/13/13 (!) 117/55   Pulse Readings from Last 3 Encounters:  12/03/15 83  09/15/15 88  09/13/13 82   BMI Readings from Last 3 Encounters:  12/03/15 24.22 kg/m  09/15/15 23.87 kg/m  09/13/13 26.30 kg/m  Lab Results  Component Value Date   HGBA1C 5.2 12/03/2015    Patient Active Problem List   Diagnosis Date Noted  . h/o Hypertriglyceridemia 12/03/2015    Priority: High  . h/o Hyperlipidemia 12/03/2015    Priority: High  . h/o Mild peripheral edema 12/20/2015    Priority: Medium  . h/o being Overweight 12/03/2015    Priority: Medium  . Dietary B12 deficiency 12/20/2015    Priority: Low  . History of chickenpox ( Aug '17 )  12/20/2015    Priority: Low  . Screening for multiple conditions 12/20/2015    Priority: Low  . Pustular psoriasis of palms and soles 10/28/2010    Priority: Low     Past Medical History:  Diagnosis Date  . Arthritis   . Hyperlipidemia   . Psoriasis   . Thyroid disease   . Wears contact lenses      Past Surgical History:  Procedure Laterality Date  . ABDOMINAL HYSTERECTOMY  2000   part  . COLONOSCOPY    . COSMETIC SURGERY  1999   breast implants  . HAGLAND'S DEFORMITY EXCISION Left 09/13/2013   Procedure: LEFT FOOT: EXCISION INTERDIGITAL MORTON'S NEUROMA SINGLE EACH;  Surgeon: Thera FlakeW D Caffrey Jr., MD;  Location: [facility];  Service: Orthopedics;  Laterality: Left;  . KNEE ARTHROSCOPY  2013,2011   right/left  . SHOULDER ARTHROSCOPY     rt  . TUBAL LIGATION        Family History  Problem Relation Age of Onset  . Kidney disease Mother   . Diabetes Mother   . Heart attack Father   . Arthritis Brother   . Heart attack Maternal Grandmother   . Heart attack Maternal Grandfather   . Cancer Paternal Grandmother   . Heart attack Paternal Grandfather      History  Drug Use No    History  Alcohol Use No    History  Smoking Status  . Former Smoker  . Quit date: 09/12/2012  Smokeless Tobacco  . Never Used    Patient's Medications  New Prescriptions   No medications on file  Previous Medications   ACYCLOVIR (ZOVIRAX) 400 MG TABLET    Take 400 mg by mouth daily as needed.   APREMILAST (OTEZLA) 30 MG TABS    Take 1 tablet by mouth 2 (two) times daily.    BIOTIN 2500 MCG CAPS    Take by mouth.   CALCIPOTRIENE (DOVONOX) 0.005 % CREAM    Apply topically 2 (two) times daily.   CLOBETASOL CREAM (TEMOVATE) 0.05 %    Apply 1 application topically 2 (two) times daily.   ESTRADIOL (ESTRACE) 1 MG TABLET    Take 1 mg by mouth 3 (three) times daily.    HYDROCHLOROTHIAZIDE (HYDRODIURIL) 25 MG TABLET    Take 25 mg by mouth as needed.    HYDROXYZINE (ATARAX/VISTARIL) 25 MG TABLET    TAKE 1 TABLET BY MOUTH 3 TIMES DAILY AS NEEDED FOR ITCHING   PHENTERMINE (ADIPEX-P) 37.5 MG TABLET       VITAMIN B-12 (CYANOCOBALAMIN) 1000 MCG TABLET    Take 2,500 mcg by mouth daily.  Modified Medications   No medications on file  Discontinued Medications   FENOFIBRATE 160 MG TABLET    Take 160 mg by mouth daily.   OXYCODONE-ACETAMINOPHEN (ROXICET) 5-325 MG PER TABLET    Take 1-2 tablets by mouth every 4 (four) hours as needed for severe pain.    Allergies: Patient has no known allergies.  Review of Systems  Constitutional: Negative.  Negative for chills, diaphoresis, fever, malaise/fatigue and weight loss.  HENT: Negative.  Negative for congestion, sore throat and tinnitus.   Eyes: Negative.  Negative for blurred vision, double vision and photophobia.   Respiratory: Negative.  Negative for cough and wheezing.   Cardiovascular: Negative.  Negative for chest pain and palpitations.  Gastrointestinal: Negative.  Negative for blood in stool, diarrhea, nausea and vomiting.  Genitourinary: Negative.  Negative for dysuria, frequency and urgency.  Musculoskeletal: Positive for joint pain. Negative for myalgias.  Skin: Negative.  Negative for itching and rash.  Neurological: Negative.  Negative for dizziness, focal weakness, weakness and headaches.  Endo/Heme/Allergies: Negative.  Negative for environmental allergies and polydipsia. Does not bruise/bleed easily.  Psychiatric/Behavioral: Negative.  Negative for depression and memory loss. The patient is not nervous/anxious and does not have insomnia.      Objective:   Blood pressure 106/68, pulse 83, height 5\' 8"  (1.727 m), weight 159 lb 4.8 oz (72.3 kg). Body mass index is 24.22 kg/m. General: Well Developed, well nourished, and in no acute distress.  Neuro: Alert and oriented x3, extra-ocular muscles intact, sensation grossly intact.  HEENT: Normocephalic, atraumatic, pupils equal round reactive to light, neck supple, No carotid bruits, no JVD Skin: no gross suspicious lesions or rashes  Cardiac: Regular rate and rhythm, no murmurs rubs or gallops.  Respiratory: Essentially clear to auscultation bilaterally. Not using accessory muscles, speaking in full sentences.  Musculoskeletal: Ambulates w/o diff, FROM * 4 ext.  Vasc: less 2 sec cap RF, warm and pink, scant peripheral edema equal bilaterally. Psych:  No HI/SI, judgement and insight good, Euthymic mood. Full Affect.

## 2015-12-03 NOTE — Assessment & Plan Note (Addendum)
Uses phenteramine 3 months/ year or so to keep wt off..Marland Kitchen

## 2015-12-03 NOTE — Patient Instructions (Addendum)
Since she recently had chickenpox back on 8\29\17 an outbreak let me do some research regarding the Zostavax on an adult outbreaks of chickenpox.    Please realize, EXERCISE IS MEDICINE!  -  American Heart Association [facility]( AHA) guidelines for exercise : If you are in good health, without any medical conditions, you should engage in 150 minutes of moderate intensity aerobic activity per week.  This means you should be huffing and puffing throughout your workout.   Engaging in regular exercise will improve brain function and memory, as well as improve mood, boost immune system and help with weight management.  As well as the other, more well-known effects of exercise such as decreasing blood sugar levels, decreasing blood pressure,  and decreasing bad cholesterol levels/ increasing good cholesterol levels.     -  The AHA strongly endorses consumption of a diet that contains a variety of foods from all the food categories with an emphasis on fruits and vegetables; fat-free and low-fat dairy products; cereal and grain products; legumes and nuts; and fish, poultry, and/or extra lean meats.    Excessive food intake, especially of foods high in saturated and trans fats, sugar, and salt, should be avoided.    Adequate water intake of roughly 1/2 of your weight in pounds, should equal the ounces of water per day you should drink.  So for instance, if you're 200 pounds, that would be 100 ounces of water per day.         Mediterranean Diet  Why follow it? Research shows. . Those who follow the Mediterranean diet have a reduced risk of heart disease  . The diet is associated with a reduced incidence of Parkinson's and Alzheimer's diseases . People following the diet may have longer life expectancies and lower rates of chronic diseases  . The Dietary Guidelines for Americans recommends the Mediterranean diet as an eating plan to promote health and prevent disease  What Is the Mediterranean Diet?  . Healthy  eating plan based on typical foods and recipes of Mediterranean-style cooking . The diet is primarily a plant based diet; these foods should make up a majority of meals   Starches - Plant based foods should make up a majority of meals - They are an important sources of vitamins, minerals, energy, antioxidants, and fiber - Choose whole grains, foods high in fiber and minimally processed items  - Typical grain sources include wheat, oats, barley, corn, brown rice, bulgar, farro, millet, polenta, couscous  - Various types of beans include chickpeas, lentils, fava beans, black beans, white beans   Fruits  Veggies - Large quantities of antioxidant rich fruits & veggies; 6 or more servings  - Vegetables can be eaten raw or lightly drizzled with oil and cooked  - Vegetables common to the traditional Mediterranean Diet include: artichokes, arugula, beets, broccoli, brussel sprouts, cabbage, carrots, celery, collard greens, cucumbers, eggplant, kale, leeks, lemons, lettuce, mushrooms, okra, onions, peas, peppers, potatoes, pumpkin, radishes, rutabaga, shallots, spinach, sweet potatoes, turnips, zucchini - Fruits common to the Mediterranean Diet include: apples, apricots, avocados, cherries, clementines, dates, figs, grapefruits, grapes, melons, nectarines, oranges, peaches, pears, pomegranates, strawberries, tangerines  Fats - Replace butter and margarine with healthy oils, such as olive oil, canola oil, and tahini  - Limit nuts to no more than a handful a day  - Nuts include walnuts, almonds, pecans, pistachios, pine nuts  - Limit or avoid candied, honey roasted or heavily salted nuts - Olives are central to the Mediterranean diet -  can be eaten whole or used in a variety of dishes   Meats Protein - Limiting red meat: no more than a few times a month - When eating red meat: choose lean cuts and keep the portion to the size of deck of cards - Eggs: approx. 0 to 4 times a week  - Fish and lean poultry: at  least 2 a week  - Healthy protein sources include, chicken, [place], lean beef, lamb - Increase intake of seafood such as tuna, salmon, trout, mackerel, shrimp, scallops - Avoid or limit high fat processed meats such as sausage and bacon  Dairy - Include moderate amounts of low fat dairy products  - Focus on healthy dairy such as fat free yogurt, skim milk, low or reduced fat cheese - Limit dairy products higher in fat such as whole or 2% milk, cheese, ice cream  Alcohol - Moderate amounts of red wine is ok  - No more than 5 oz daily for women (all ages) and men older than age 59  - No more than 10 oz of wine daily for men younger than 4365  Other - Limit sweets and other desserts  - Use herbs and spices instead of salt to flavor foods  - Herbs and spices common to the traditional Mediterranean Diet include: basil, bay leaves, chives, cloves, cumin, fennel, garlic, lavender, marjoram, mint, oregano, parsley, pepper, rosemary, sage, savory, sumac, tarragon, thyme   It's not just a diet, it's a lifestyle:  . The Mediterranean diet includes lifestyle factors typical of those in the region  . Foods, drinks and meals are best eaten with others and savored . Daily physical activity is important for overall good health . This could be strenuous exercise like running and aerobics . This could also be more leisurely activities such as walking, housework, yard-work, or taking the stairs . Moderation is the key; a balanced and healthy diet accommodates most foods and drinks . Consider portion sizes and frequency of consumption of certain foods   Meal Ideas & Options:  . Breakfast:  o Whole wheat toast or whole wheat English muffins with peanut butter & hard boiled egg o Steel cut oats topped with apples & cinnamon and skim milk  o Fresh fruit: banana, strawberries, melon, berries, peaches  o Smoothies: strawberries, bananas, greek yogurt, peanut butter o Low fat greek yogurt with blueberries and  granola  o Egg white omelet with spinach and mushrooms o Breakfast couscous: whole wheat couscous, apricots, skim milk, cranberries  . Sandwiches:  o Hummus and grilled vegetables (peppers, zucchini, squash) on whole wheat bread   o Grilled chicken on whole wheat pita with lettuce, tomatoes, cucumbers or tzatziki  o Tuna salad on whole wheat bread: tuna salad made with greek yogurt, olives, red peppers, capers, green onions o Garlic rosemary lamb pita: lamb sauted with garlic, rosemary, salt & pepper; add lettuce, cucumber, greek yogurt to pita - flavor with lemon juice and black pepper  . Seafood:  o Mediterranean grilled salmon, seasoned with garlic, basil, parsley, lemon juice and black pepper o Shrimp, lemon, and spinach whole-grain pasta salad made with low fat greek yogurt  o Seared scallops with lemon orzo  o Seared tuna steaks seasoned salt, pepper, coriander topped with tomato mixture of olives, tomatoes, olive oil, minced garlic, parsley, green onions and cappers  . Meats:  o Herbed greek chicken salad with kalamata olives, cucumber, feta  o Red bell peppers stuffed with spinach, bulgur, lean ground beef (or lentils) &  topped with feta   o Kebabs: skewers of chicken, tomatoes, onions, zucchini, squash  o [place] burgers: made with red onions, mint, dill, lemon juice, feta cheese topped with roasted red peppers . Vegetarian o Cucumber salad: cucumbers, artichoke hearts, celery, red onion, feta cheese, tossed in olive oil & lemon juice  o Hummus and whole grain pita points with a greek salad (lettuce, tomato, feta, olives, cucumbers, red onion) o Lentil soup with celery, carrots made with vegetable broth, garlic, salt and pepper  o Tabouli salad: parsley, bulgur, mint, scallions, cucumbers, tomato, radishes, lemon juice, olive oil, salt and pepper.

## 2015-12-04 LAB — LIPID PANEL
CHOL/HDL RATIO: 6.1 ratio — AB (ref <5.0)
Cholesterol: 280 mg/dL — ABNORMAL HIGH (ref <200)
HDL: 46 mg/dL — ABNORMAL LOW (ref >50)
LDL CALC: 191 mg/dL — AB (ref <100)
Triglycerides: 213 mg/dL — ABNORMAL HIGH (ref <150)
VLDL: 43 mg/dL — ABNORMAL HIGH (ref <30)

## 2015-12-04 LAB — TSH: TSH: 0.75 mIU/L

## 2015-12-04 LAB — COMPLETE METABOLIC PANEL WITH GFR
ALBUMIN: 4.3 g/dL (ref 3.6–5.1)
ALT: 19 U/L (ref 6–29)
AST: 17 U/L (ref 10–35)
Alkaline Phosphatase: 97 U/L (ref 33–130)
BUN: 7 mg/dL (ref 7–25)
CO2: 28 mmol/L (ref 20–31)
Calcium: 10.1 mg/dL (ref 8.6–10.4)
Chloride: 103 mmol/L (ref 98–110)
Creat: 0.87 mg/dL (ref 0.50–1.05)
GFR, Est African American: 85 mL/min (ref >=60)
GFR, Est Non African American: 74 mL/min (ref >=60)
GLUCOSE: 92 mg/dL (ref 65–99)
POTASSIUM: 5.3 mmol/L (ref 3.5–5.3)
Sodium: 140 mmol/L (ref 135–146)
Total Bilirubin: 0.4 mg/dL (ref 0.2–1.2)
Total Protein: 7.1 g/dL (ref 6.1–8.1)

## 2015-12-04 LAB — CBC WITH DIFFERENTIAL/PLATELET
Basophils Absolute: 0 cells/uL (ref 0–200)
Basophils Relative: 0 %
Eosinophils Absolute: 194 cells/uL (ref 15–500)
Eosinophils Relative: 2 %
HEMATOCRIT: 42.9 % (ref 35.0–45.0)
HEMOGLOBIN: 14.4 g/dL (ref 11.7–15.5)
LYMPHS ABS: 3686 {cells}/uL (ref 850–3900)
Lymphocytes Relative: 38 %
MCH: 31.4 pg (ref 27.0–33.0)
MCHC: 33.6 g/dL (ref 32.0–36.0)
MCV: 93.7 fL (ref 80.0–100.0)
MONO ABS: 679 {cells}/uL (ref 200–950)
MPV: 9.6 fL (ref 7.5–12.5)
Monocytes Relative: 7 %
Neutro Abs: 5141 cells/uL (ref 1500–7800)
Neutrophils Relative %: 53 %
Platelets: 503 10*3/uL — ABNORMAL HIGH (ref 140–400)
RBC: 4.58 MIL/uL (ref 3.80–5.10)
RDW: 13.1 % (ref 11.0–15.0)
WBC: 9.7 10*3/uL (ref 3.8–10.8)

## 2015-12-04 LAB — VITAMIN B12: Vitamin B-12: >2000 pg/mL — ABNORMAL HIGH (ref 200–1100)

## 2015-12-04 LAB — T4, FREE: FREE T4: 1.5 ng/dL (ref 0.8–1.8)

## 2015-12-04 LAB — HEMOGLOBIN A1C
Hgb A1c MFr Bld: 5.2 % (ref <5.7)
Mean Plasma Glucose: 103 mg/dL

## 2015-12-04 LAB — VITAMIN D 25 HYDROXY (VIT D DEFICIENCY, FRACTURES): Vit D, 25-Hydroxy: 25 ng/mL — ABNORMAL LOW (ref 30–100)

## 2015-12-20 ENCOUNTER — Telehealth: Payer: Self-pay | Admitting: Family Medicine

## 2015-12-20 ENCOUNTER — Encounter: Payer: Self-pay | Admitting: Family Medicine

## 2015-12-20 DIAGNOSIS — R609 Edema, unspecified: Secondary | ICD-10-CM

## 2015-12-20 DIAGNOSIS — R600 Localized edema: Secondary | ICD-10-CM | POA: Insufficient documentation

## 2015-12-20 DIAGNOSIS — Z0000 Encounter for general adult medical examination without abnormal findings: Secondary | ICD-10-CM | POA: Insufficient documentation

## 2015-12-20 DIAGNOSIS — Z1211 Encounter for screening for malignant neoplasm of colon: Secondary | ICD-10-CM

## 2015-12-20 DIAGNOSIS — E538 Deficiency of other specified B group vitamins: Secondary | ICD-10-CM | POA: Insufficient documentation

## 2015-12-20 DIAGNOSIS — Z8619 Personal history of other infectious and parasitic diseases: Secondary | ICD-10-CM | POA: Insufficient documentation

## 2015-12-20 NOTE — Assessment & Plan Note (Signed)
-   Patient reports normal colonoscopy 2011  - Mammogram and Pap smear March 2017:N   - Tdap: January 2011

## 2015-12-20 NOTE — Assessment & Plan Note (Signed)
Patient denies history of hypertension- takes thiazide diuretic for fluid accumulation in her bilateral lower extremities.

## 2015-12-20 NOTE — Assessment & Plan Note (Addendum)
I will send patient in my chart message stating that the federal government's Advisory Committee on Immunization Practices (ACIP) and hence the CDC does not provide clear guidelines at this time regarding vaccination in someone her age with her medical history.   We physicians make recommendations to our patients based on the CURRENT evidence in front of us- and unfortunately there is just not enough evidence for me to recommend Zostavax ( Merck) at this time,  versus Shingrix (GlaxoSmithKline) vaccine for someone her age right now.     There is a lot of new information that recently came out in October 2017 and hence the 2018 guidelines may be different but my recommendation at this time would be for her to wait till she 60 years old to get any type of shingles vaccine.   I also recommend you contact your medical insurance company to ensure their coverage and see what your cost would be for this.  Since vaccination will only decrease symptoms of herpes zoster in approximately 50-60% of the patient's anyhow, I encourage you to have a detailed discussion about the risks and benefits of these medications with the manufacturers about other drugs ( Merck or [facility] [employer]line ) as well as the [employer], so you can make the best informed decision that is right for you.

## 2015-12-20 NOTE — Telephone Encounter (Signed)
PLEASE disregard the note created at 12:01 PM on 12\3\17 - it is inaccurate information, and I cannot delete from the chart.  My chart message sent to patient today to address her questions and concerns re: the Zostavax vaccine.  See below.    " Dear Ms. Ambroise,  In addition to helping you feel better when you are sick, we are focused on preventing illness and chronic disease in the first place.  In the spirit of maintaining accurate health records, your medical chart indicates that you are due for the following:  Health Maintenance Due  Topic Date Due  . Hepatitis C Screening  [birth year]  . HIV Screening  12/24/1971  . TETANUS/TDAP  12/24/1975  . PAP SMEAR  12/23/1977  . COLONOSCOPY  12/24/2006  . MAMMOGRAM  12/06/2014   Please call us at your earliest convenience to let us know if these dates are not accurate so we can update your medical records with the proper information.  Also, as promised, I'm getting back to you regarding your questions about whether or not you need the Zostavax vaccine now.  After much research, I must tell you this is a loaded question and there is no clear-cut, black and white answer.   At this time, the federal government's Advisory Committee on [employer] Practices (ACIP) and hence the CDC does not provide clear guidelines regarding vaccination in a 60 yo woman with your medical history- recent chickenpox illness.     We physicians make recommendations to our patients based on the CURRENT evidence in front of us, and unfortunately there is just not enough evidence for me to recommend Zostavax (made by [employer])  versus Shingrix (GlaxoSmithKline) vaccine for someone your age at this time.    However, recently there was alot of new information that was published towards the end of October, and hence the 2018 guidelines may be very different.  Yet, at this time, my recommendation would be for you to wait until you turn 60 years old to get a shingles vaccine.   I  also recommend you contact your medical insurance company to ensure their coverage and see what your cost would be for this.  Since vaccination will only decrease symptoms of herpes zoster in approximately 50-60% of the patient's anyhow, I highly encourage you to have a detailed discussion about the risks and benefits of these medications with the manufacturers about their drugs ( Merck or [facility] SmithKline ), as well as with the CDC, so you can make the best informed decision that is right for .  A quick internet search for the phone numbers to call for customer service is your best bet.   I look forward to seeing you soon.  Sincerely,   Dr Leane Callpalski [facility]- family medicine division"

## 2015-12-20 NOTE — Assessment & Plan Note (Signed)
[facility]

## 2015-12-20 NOTE — Telephone Encounter (Signed)
Questions regarding the supply of these Merck products should be addressed to [employer] Vaccine Customer Center by telephone [phone]).

## 2015-12-20 NOTE — Assessment & Plan Note (Signed)
supp daily

## 2015-12-21 NOTE — Telephone Encounter (Signed)
I do not see anything in the chart that any of this was communicated to the patient.      Please call the patient to discuss what I typed here  or send her a letter via snail mail by copying and placed in the part that the applicable for her to understand.

## 2015-12-21 NOTE — Telephone Encounter (Signed)
I do not see anything in the chart that any of this was communicated to the patient.      Please call the patient to discuss what I typed here  or send her a letter via snail mail by copying and placed in the part that the applicable for her to understand. 

## 2015-12-28 ENCOUNTER — Ambulatory Visit (INDEPENDENT_AMBULATORY_CARE_PROVIDER_SITE_OTHER): Payer: 59 | Admitting: Family Medicine

## 2015-12-28 VITALS — BP 117/76 | HR 118 | Ht 68.0 in | Wt 161.6 lb

## 2015-12-28 DIAGNOSIS — R748 Abnormal levels of other serum enzymes: Secondary | ICD-10-CM | POA: Diagnosis not present

## 2015-12-28 DIAGNOSIS — E781 Pure hyperglyceridemia: Secondary | ICD-10-CM

## 2015-12-28 DIAGNOSIS — E663 Overweight: Secondary | ICD-10-CM

## 2015-12-28 DIAGNOSIS — E786 Lipoprotein deficiency: Secondary | ICD-10-CM | POA: Diagnosis not present

## 2015-12-28 DIAGNOSIS — Z87891 Personal history of nicotine dependence: Secondary | ICD-10-CM | POA: Diagnosis not present

## 2015-12-28 DIAGNOSIS — E782 Mixed hyperlipidemia: Secondary | ICD-10-CM

## 2015-12-28 DIAGNOSIS — E559 Vitamin D deficiency, unspecified: Secondary | ICD-10-CM | POA: Diagnosis not present

## 2015-12-28 DIAGNOSIS — E7800 Pure hypercholesterolemia, unspecified: Secondary | ICD-10-CM | POA: Insufficient documentation

## 2015-12-28 DIAGNOSIS — E538 Deficiency of other specified B group vitamins: Secondary | ICD-10-CM

## 2015-12-28 DIAGNOSIS — R7989 Other specified abnormal findings of blood chemistry: Secondary | ICD-10-CM

## 2015-12-28 NOTE — Progress Notes (Signed)
Assessment and plan:  1. Vitamin D deficiency   2. Smoking hx- ( less 1 ppd * 15 yrs) - Quit Jan 17 2013   3. Low HDL   4. Elevated vitamin B12 level   5. h/o being Overweight   6. Mixed hyperlipidemia   7. Dietary B12 deficiency   8. h/o Hypertriglyceridemia     Elevated vitamin B12 level She already decreasd aamnt B12 by over 1/2--> she take 1000 QOD--> reck 4-6 mo  h/o being Overweight Uses phenteramine 3 months/ year or so to keep wt off..   h/o Hyperlipidemia Less than 7.5% 10-yr risk  Dietary changes such as low saturated & trans fat and low carb/ ketogenic diets discussed with patient.    Encouraged regular exercise   Educational handouts provided at patient's desire.  Contact us prior with any Q's/ concerns.  Dietary B12 deficiency Take 1/2 or less of your supp.   B12 too hgih  Vitamin D deficiency Inc daily to 5k IU QD.    Reck 6-12 mo  h/o Hypertriglyceridemia Avoid sugars/ carbs, esp those with a lot fats, dec EOTH intake etc.  Exercise more    New Prescriptions   No medications on file    Modified Medications   No medications on file    Discontinued Medications   HYDROXYZINE (ATARAX/VISTARIL) 25 MG TABLET    TAKE 1 TABLET BY MOUTH 3 TIMES DAILY AS NEEDED FOR ITCHING     Return in about 6 months (around 06/27/2016) for Follow-up of current medical issues; needs yrly CPE as well.  Anticipatory guidance and routine counseling done re: condition, txmnt options and need for follow up. All questions of patient's were answered.   Gross side effects, risk and benefits, and alternatives of medications discussed with patient.  Patient is aware that all medications have potential side effects and we are unable to predict every sideeffect or drug-drug interaction that may occur.  Expresses verbal understanding and consents to current therapy plan and treatment regiment.  Please see AVS  handed out to patient at the end of our visit for additional patient instructions/ counseling done pertaining to today's office visit.  Note: This document was prepared using Dragon voice recognition software and may include unintentional dictation errors.   ----------------------------------------------------------------------------------------------------------------------  Subjective:   CC:   Ann Newton is a 60 y.o. female who presents to [place] at [facility] today for review and discussion of recent bloodwork that was done.  1. All recent blood work that we ordered was reviewed with patient today.  Patient was counseled on all abnormalities and we discussed dietary and lifestyle changes that could help those values (also medications when appropriate).  Extensive health counseling performed and all patient's concerns/ questions were addressed.   - chol abn-->   - Low Vit D  - Vit B12-- too high    Wt Readings from Last 3 Encounters:  12/28/15 161 lb 9.6 oz (73.3 kg)  12/03/15 159 lb 4.8 oz (72.3 kg)  09/15/15 157 lb (71.2 kg)   BP Readings from Last 3 Encounters:  12/28/15 117/76  12/03/15 106/68  09/15/15 130/82   Pulse Readings from Last 3 Encounters:  12/28/15 (!) 118  12/03/15 83  09/15/15 88   BMI Readings from Last 3 Encounters:  12/28/15 24.57 kg/m  12/03/15 24.22 kg/m  09/15/15 23.87 kg/m     Patient Care Team    Relationship Specialty Notifications Start End  Neoma Laming  Raliegh Scarlet, DO PCP - General Family Medicine  11/03/15   Earlie Server, MD Consulting Physician Orthopedic Surgery  11/03/15   Jarome Matin, MD Consulting Physician Dermatology  11/03/15   Brien Few, MD Consulting Physician Obstetrics and Gynecology  11/03/15   Jerrell Belfast, MD Consulting Physician Otolaryngology  11/03/15   Sherlyn Lick, MD Consulting Physician Dermatology  12/20/15    Comment: [facility]    Full medical history updated and reviewed in the  office today  Patient Active Problem List   Diagnosis Date Noted  . Vitamin D deficiency 01/24/2016    Priority: High  . Smoking hx- ( less 1 ppd * 15 yrs) - Quit Jan 17 2013 12/28/2015    Priority: High  . Low HDL 12/28/2015    Priority: High  . h/o Hypertriglyceridemia 12/03/2015    Priority: High  . h/o Hyperlipidemia 12/03/2015    Priority: High  . h/o Mild peripheral edema- takes HCTZ for this 12/20/2015    Priority: Medium  . h/o being Overweight 12/03/2015    Priority: Medium  . Dietary B12 deficiency 12/20/2015    Priority: Low  . Pustular psoriasis of palms and soles 10/28/2010    Priority: Low  . Elevated vitamin B12 level 12/28/2015  . History of chickenpox ( Aug '17 )  12/20/2015  . Screening for multiple conditions 12/20/2015    Past Medical History:  Diagnosis Date  . Arthritis   . Hyperlipidemia   . Psoriasis   . Thyroid disease   . Wears contact lenses     Past Surgical History:  Procedure Laterality Date  . ABDOMINAL HYSTERECTOMY  2000   part  . COLONOSCOPY    . COSMETIC SURGERY  1999   breast implants  . HAGLAND'S DEFORMITY EXCISION Left 09/13/2013   Procedure: LEFT FOOT: EXCISION INTERDIGITAL MORTON'S NEUROMA SINGLE EACH;  Surgeon: Yvette Rack., MD;  Location: [facility];  Service: Orthopedics;  Laterality: Left;  . KNEE ARTHROSCOPY  2013,2011   right/left  . SHOULDER ARTHROSCOPY     rt  . TUBAL LIGATION      Social History  Substance Use Topics  . Smoking status: Former Smoker    Quit date: 09/12/2012  . Smokeless tobacco: Never Used  . Alcohol use No    Family Hx: Family History  Problem Relation Age of Onset  . Kidney disease Mother   . Diabetes Mother   . Heart attack Father   . Arthritis Brother   . Heart attack Maternal Grandmother   . Heart attack Maternal Grandfather   . Cancer Paternal Grandmother   . Heart attack Paternal Grandfather      Medications: Current Outpatient Prescriptions  Medication  Sig Dispense Refill  . acyclovir (ZOVIRAX) 400 MG tablet Take 400 mg by mouth daily as needed.    Marland Kitchen Apremilast (OTEZLA) 30 MG TABS Take 1 tablet by mouth 2 (two) times daily.     . Biotin 2500 MCG CAPS Take by mouth.    . calcipotriene (DOVONOX) 0.005 % cream Apply topically 2 (two) times daily.    . Cholecalciferol (VITAMIN D3) 1000 units CAPS Take 1,000 Units by mouth daily.    . clobetasol cream (TEMOVATE) 9.37 % Apply 1 application topically 2 (two) times daily.    Marland Kitchen estradiol (ESTRACE) 1 MG tablet Take 1 mg by mouth 3 (three) times daily.     . hydrochlorothiazide (HYDRODIURIL) 25 MG tablet Take 25 mg by mouth as needed.     Marland Kitchen  phentermine (ADIPEX-P) 37.5 MG tablet   2  . vitamin B-12 (CYANOCOBALAMIN) 1000 MCG tablet Take 2,500 mcg by mouth daily.     No current facility-administered medications for this visit.     Allergies:  No Known Allergies   ROS: Review of Systems  All other systems reviewed and are negative. Const:    no fevers, chills Eyes:    conjunctiva clear, no vision changes or blurred vision ENT:  no hearing difficulties, no dysphagia, no dysphonia, no nose bleeds CV:   no chest pain, arrhythmias, no orthopnea, no PND Pulm:   no SOB at rest or exertion, no Wheeze, no DIB, no hemoptysis GI:    no N/V/D/C, no abd pain GU:   no blood in urine or inc freq or urgency Heme/Onc:    no unexplained bleeding, no night sweats, no more fatigue than usual Neuro:   No dizziness, no LOC, No unexplained weakness or numbness Endo:   no unexplained wt loss or gain M-Sk:   no localized myalgias or arthralgias Psych:    No SI/HI, no memory prob or unexplained confusion   Objective:  Blood pressure 117/76, pulse (!) 118, height 5' 8" (1.727 m), weight 161 lb 9.6 oz (73.3 kg). Body mass index is 24.57 kg/m. Gen:   Well NAD, A and O *3 HEENT:    [place]/AT, EOMI,  MMM, OP- clr Lungs:   Normal work of breathing. CTA B/L, no Wh, rhonchi Heart:   RRR, S1, S2 WNL's, no MRG Abd:   No  gross distention Exts:    warm, pink,  Brisk capillary refill, warm and well perfused.  Psych:    No HI/SI, judgement and insight good, Euthymic mood. Full Affect.   Recent Results (from the past 2160 hour(s))  COMPLETE METABOLIC PANEL WITH GFR     Status: None   Collection Time: 12/03/15 12:01 PM  Result Value Ref Range   Sodium 140 135 - 146 mmol/L   Potassium 5.3 3.5 - 5.3 mmol/L   Chloride 103 98 - 110 mmol/L   CO2 28 20 - 31 mmol/L   Glucose, Bld 92 65 - 99 mg/dL   BUN 7 7 - 25 mg/dL   Creat 0.87 0.50 - 1.05 mg/dL    Comment:   For patients > or = 59 years of age: The upper reference limit for Creatinine is approximately 13% higher for people identified as African-American.      Total Bilirubin 0.4 0.2 - 1.2 mg/dL   Alkaline Phosphatase 97 33 - 130 U/L   AST 17 10 - 35 U/L   ALT 19 6 - 29 U/L   Total Protein 7.1 6.1 - 8.1 g/dL   Albumin 4.3 3.6 - 5.1 g/dL   Calcium 10.1 8.6 - 10.4 mg/dL   GFR, Est African American 85 >=60 mL/min   GFR, Est Non African American 74 >=60 mL/min  CBC with Differential/Platelet     Status: Abnormal   Collection Time: 12/03/15 12:01 PM  Result Value Ref Range   WBC 9.7 3.8 - 10.8 K/uL   RBC 4.58 3.80 - 5.10 MIL/uL   Hemoglobin 14.4 11.7 - 15.5 g/dL   HCT 42.9 35.0 - 45.0 %   MCV 93.7 80.0 - 100.0 fL   MCH 31.4 27.0 - 33.0 pg   MCHC 33.6 32.0 - 36.0 g/dL   RDW 13.1 11.0 - 15.0 %   Platelets 503 (H) 140 - 400 K/uL   MPV 9.6 7.5 - 12.5 fL  Neutro Abs 5,141 1,500 - 7,800 cells/uL   Lymphs Abs 3,686 850 - 3,900 cells/uL   Monocytes Absolute 679 200 - 950 cells/uL   Eosinophils Absolute 194 15 - 500 cells/uL   Basophils Absolute 0 0 - 200 cells/uL   Neutrophils Relative % 53 %   Lymphocytes Relative 38 %   Monocytes Relative 7 %   Eosinophils Relative 2 %   Basophils Relative 0 %   Smear Review Criteria for review not met   Hemoglobin A1c     Status: None   Collection Time: 12/03/15 12:01 PM  Result Value Ref Range   Hgb A1c MFr  Bld 5.2 <5.7 %    Comment:   For the purpose of screening for the presence of diabetes:   <5.7%       Consistent with the absence of diabetes 5.7-6.4 %   Consistent with increased risk for diabetes (prediabetes) >=6.5 %     Consistent with diabetes   This assay result is consistent with a decreased risk of diabetes.   Currently, no consensus exists regarding use of hemoglobin A1c for diagnosis of diabetes in children.   According to American Diabetes Association (ADA) guidelines, hemoglobin A1c <7.0% represents optimal control in non-pregnant diabetic patients. Different metrics may apply to specific patient populations. Standards of Medical Care in Diabetes (ADA).      Mean Plasma Glucose 103 mg/dL  Lipid panel     Status: Abnormal   Collection Time: 12/03/15 12:01 PM  Result Value Ref Range   Cholesterol 280 (H) <200 mg/dL    Comment: ** Please note change in reference range(s). **      Triglycerides 213 (H) <150 mg/dL    Comment: ** Please note change in reference range(s). **      HDL 46 (L) >50 mg/dL    Comment: ** Please note change in reference range(s). **      Total CHOL/HDL Ratio 6.1 (H) <5.0 Ratio   VLDL 43 (H) <30 mg/dL   LDL Cholesterol 191 (H) <100 mg/dL    Comment: ** Please note change in reference range(s). **     T4, free     Status: None   Collection Time: 12/03/15 12:01 PM  Result Value Ref Range   Free T4 1.5 0.8 - 1.8 ng/dL  TSH     Status: None   Collection Time: 12/03/15 12:01 PM  Result Value Ref Range   TSH 0.75 mIU/L    Comment:   Reference Range   > or = 20 Years  0.40-4.50   Pregnancy Range First trimester  0.26-2.66 Second trimester 0.55-2.73 Third trimester  0.43-2.91     Vitamin B12     Status: Abnormal   Collection Time: 12/03/15 12:01 PM  Result Value Ref Range   Vitamin B-12 >2000 (H) 200 - 1100 pg/mL  VITAMIN D 25 Hydroxy (Vit-D Deficiency, Fractures)     Status: Abnormal   Collection Time: 12/03/15 12:01 PM  Result  Value Ref Range   Vit D, 25-Hydroxy 25 (L) 30 - 100 ng/mL    Comment: Vitamin D Status           25-OH Vitamin D        Deficiency                <20 ng/mL        Insufficiency         20 - 29 ng/mL        Optimal             >  or = 30 ng/mL   For 25-OH Vitamin D testing on patients on D2-supplementation and patients for whom quantitation of D2 and D3 fractions is required, the [facility] 25-OH VIT D, (D2,D3), LC/MS/MS is recommended: order code 85814 (patients > 2 yrs).     

## 2015-12-28 NOTE — Assessment & Plan Note (Signed)
She already decreasd aamnt B12 by over 1/2--> she take 1000 QOD--> reck 4-6 mo

## 2015-12-28 NOTE — Patient Instructions (Addendum)
According to [employer] and American [employer] Association your 10 year risk to have your first atherosclerotic cardiovascular event risk is 3.8%. This is great. We need to engage in lifestyle modifications in order to better control your cholesterol. We should recheck this 6 months.  In 6 months with recheck your fasting lipid profile, vitamin D, B12  Nine ways to increase your "good" HDL cholesterol  High-density lipoprotein (HDL) is often referred to as the "good" cholesterol. Having high HDL levels helps carry cholesterol from your arteries to your liver, where it can be used or excreted.  Having high levels of HDL also has antioxidant and anti-inflammatory effects, and is linked to a reduced risk of heart disease (1, 2).  Most health experts recommend minimum blood levels of 40 mg/dl in men and 50 mg/dl in women.  While genetics definitely play a role, there are several other factors that affect HDL levels.  Here are nine healthy ways to raise your "good" HDL cholesterol.  1. Consume olive oil  two pieces of salmon on a plate olive oil being poured into a small dish Extra virgin olive oil may be more healthful than processed olive oils. Olive oil is one of the healthiest fats around.  A large analysis of 42 studies with more than 800,000 participants found that olive oil was the only source of monounsaturated fat that seemed to reduce heart disease risk (3).  Research has shown that one of olive oil's heart-healthy effects is an increase in HDL cholesterol. This effect is thought to be caused by antioxidants it contains called polyphenols (4, 5, 6, 7).  Extra virgin olive oil has more polyphenols than more processed olive oils, although the amount can still vary among different types and brands.  One study gave 200 healthy young men about 2 tablespoons (25 ml) of different olive oils per day for three weeks.  The researchers found that participants' HDL levels  increased significantly more after they consumed the olive oil with the highest polyphenol content (6).  In another study, when 8062 older adults consumed about 4 tablespoons (50 ml) of high-polyphenol extra virgin olive oil every day for six weeks, their HDL cholesterol increased by 6.5 mg/dl, on average (7).  In addition to raising HDL levels, olive oil has been found to boost HDL's anti-inflammatory and antioxidant function in studies of older people and individuals with high cholesterol levels ( 7, 8, 9).  Whenever possible, select high-quality, certified extra virgin olive oils, which tend to be highest in polyphenols.  Bottom line: Extra virgin olive oil with a high polyphenol content has been shown to increase HDL levels in healthy people, the elderly and individuals with high cholesterol.  2. Follow a low-carb or ketogenic diet  Low-carb and ketogenic diets provide a number of health benefits, including weight loss and reduced blood sugar levels.  They have also been shown to increase HDL cholesterol in people who tend to have lower levels.  This includes those who are obese, insulin resistant or diabetic (10, 11, 12, 13, 14, 15, 16, 17).  In one study, people with type 2 diabetes were split into two groups.  One followed a diet consuming less than 50 grams of carbs per day. The other followed a high-carb diet.  Although both groups lost weight, the low-carb group's HDL cholesterol increased almost twice as much as the high-carb group's did (14).  In another study, obese people who followed a low-carb diet experienced an increase in HDL cholesterol  of 5 mg/dl overall.  Meanwhile, in the same study, the participants who ate a low-fat, high-carb diet showed a decrease in HDL cholesterol (15).  This response may partially be due to the higher levels of fat people typically consume on low-carb diets.  One study in overweight women found that diets high in meat and cheese increased HDL  levels by 5-8%, compared to a higher-carb diet (18).  What's more, in addition to raising HDL cholesterol, very-low-carb diets have been shown to decrease triglycerides and improve several other risk factors for heart disease (13, 14, 16, 17).  Bottom line: Low-carb and ketogenic diets typically increase HDL cholesterol levels in people with diabetes, metabolic syndrome and obesity.  3. Exercise regularly  Being physically active is important for heart health.  Studies have shown that many different types of exercise are effective at raising HDL cholesterol, including strength training, high-intensity exercise and aerobic exercise (19, 20, 21, 22, 23, 24).  However, the biggest increases in HDL are typically seen with high-intensity exercise.  One small study followed women who were living with polycystic ovary syndrome (PCOS), which is linked to a higher risk of insulin resistance. The study required them to perform high-intensity exercise three times a week.  The exercise led to an increase in HDL cholesterol of 8 mg/dL after 10 weeks. The women also showed improvements in other health markers, including decreased insulin resistance and improved arterial function (23).  In a 12-week study, overweight men who performed high-intensity exercise experienced a 10% increase in HDL cholesterol.  In contrast, the low-intensity exercise group showed only a 2% increase and the endurance training group experienced no change (24).  However, even lower-intensity exercise seems to increase HDL's anti-inflammatory and antioxidant capacities, whether or not HDL levels change (20, 21, 25).  Overall, high-intensity exercise such as high-intensity interval training (HIIT) and high-intensity circuit training (HICT) may boost HDL cholesterol levels the most.  Bottom line: Exercising several times per week can help raise HDL cholesterol and enhance its anti-inflammatory and antioxidant effects. High-intensity  forms of exercise may be especially effective.  4. Add coconut oil to your diet  Studies have shown that coconut oil may reduce appetite, increase metabolic rate and help protect brain health, among other benefits.  Some people may be concerned about coconut oil's effects on heart health due to its high saturated fat content.  However, it appears that coconut oil is actually quite heart healthy.  Coconut oil tends to raise HDL cholesterol more than many other types of fat.  In addition, it may improve the ratio of low-density-lipoprotein (LDL) cholesterol, the "bad" cholesterol, to HDL cholesterol. Improving this ratio reduces heart disease risk (26, 27, 28, 29).  One study examined the health effects of coconut oil on 40 women with excess belly fat. The researchers found that participants who took coconut oil daily experienced increased HDL cholesterol and a lower LDL-to-HDL ratio.  In contrast, the group who took soybean oil daily had a decrease in HDL cholesterol and an increase in the LDL-to-HDL ratio (29).  Most studies have found these health benefits occur at a dosage of about 2 tablespoons (30 ml) of coconut oil per day. It's best to incorporate this into cooking rather than eating spoonfuls of coconut oil on their own.  Bottom line: Consuming 2 tablespoons (30 ml) of coconut oil per day may help increase HDL cholesterol levels.  5. Stop smoking  cigarette butt Quitting smoking can reduce the risk of heart disease and lung  cancer. Smoking increases the risk of many health problems, including heart disease and lung cancer (30).  One of its negative effects is a suppression of HDL cholesterol.  Some studies have found that quitting smoking can increase HDL levels. Indeed, one study found no significant differences in HDL levels between former smokers and people who had never smoked (31, 32, 33, 34, 35).  In a one-year study of more than 1,500 people, those who quit smoking had  twice the increase in HDL as those who resumed smoking within the year. The number of large HDL particles also increased, which further reduced heart disease risk (32).  One study followed smokers who switched from traditional cigarettes to electronic cigarettes for one year. They found that the switch was associated with an increase in HDL cholesterol of 5 mg/dl, on average (33).  When it comes to the effect of nicotine replacement patches on HDL levels, research results have been mixed.  One study found that nicotine replacement therapy led to higher HDL cholesterol. However, other research suggests that people who use nicotine patches likely won't see increases in HDL levels until after replacement therapy is completed (34, 36).  Even in studies where HDL cholesterol levels didn't increase after people quit smoking, HDL function improved, resulting in less inflammation and other beneficial effects on heart health (37).  Bottom line: Quitting smoking can increase HDL levels, improve HDL function and help protect heart health.  6. Lose weight  When overweight and obese people lose weight, their HDL cholesterol levels usually increase.  What's more, this benefit seems to occur whether weight loss is achieved by calorie counting, carb restriction, intermittent fasting, weight loss surgery or a combination of diet and exercise (16, 38, 39, 40, 41, 42).  One study examined HDL levels in more than 3,000 overweight and obese [place] adults who followed a lifestyle modification program for one year.  The researchers found that losing at least 6.6 lbs (3 kg) led to an increase in HDL cholesterol of 4 mg/dl, on average (41).  In another study, when obese people with type 2 diabetes consumed calorie-restricted diets that provided 20-30% of calories from protein, they experienced significant increases in HDL cholesterol levels (42).  The key to achieving and maintaining healthy HDL cholesterol levels  is choosing the type of diet that makes it easiest for you to lose weight and keep it off.  Bottom Line: Several methods of weight loss have been shown to increase HDL cholesterol levels in people who are overweight or obese.  7. Choose purple produce  Consuming purple-colored fruits and vegetables is a delicious way to potentially increase HDL cholesterol.  Purple produce contains antioxidants known as anthocyanins.  Studies using anthocyanin extracts have shown that they help fight inflammation, protect your cells from damaging free radicals and may also raise HDL cholesterol levels (43, 44, 45, 46).  In a 24-week study of 58 people with diabetes, those who took an anthocyanin supplement twice a day experienced a 19% increase in HDL cholesterol, on average, along with other improvements in heart health markers (45).  In another study, when people with cholesterol issues took anthocyanin extract for 12 weeks, their HDL cholesterol levels increased by 13.7% (46).  Although these studies used extracts instead of foods, there are several fruits and vegetables that are very high in anthocyanins. These include eggplant, purple corn, red cabbage, blueberries, blackberries and black raspberries.  Bottom line: Consuming fruits and vegetables rich in anthocyanins may help increase HDL cholesterol levels.  8. Eat fatty fish often  The omega-3 fats in fatty fish provide major benefits to heart health, including a reduction in inflammation and better functioning of the cells that line your arteries (47, 48).  There's some research showing that eating fatty fish or taking fish oil may also help raise low levels of HDL cholesterol (49, 50, 51, 52, 53).  In a study of 33 heart disease patients, participants that consumed fatty fish four times per week experienced an increase in HDL cholesterol levels. The particle size of their HDL also increased (52).  In another study, overweight men who consumed  herring five days a week for six weeks had a 5% increase in HDL cholesterol, compared with their levels after eating lean pork and chicken five days a week (53).  However, there are a few studies that found no increase in HDL cholesterol in response to increased fish or omega-3 supplement intake (54, 55).  In addition to herring, other types of fatty fish that may help raise HDL cholesterol include salmon, sardines, mackerel and anchovies.  Bottom line: Eating fatty fish several times per week may help increase HDL cholesterol levels and provide other benefits to heart health.  9. Avoid artificial trans fats  Artificial trans fats have many negative health effects due to their inflammatory properties (56, 57).  There are two types of trans fats. One kind occurs naturally in animal products, including full-fat dairy.  In contrast, the artificial trans fats found in margarines and processed foods are created by adding hydrogen to unsaturated vegetable and seed oils. These fats are also known as industrial trans fats or partially hydrogenated fats.  Research has shown that, in addition to increasing inflammation and contributing to several health problems, these artificial trans fats may lower HDL cholesterol levels.  In one study, researchers compared how people's HDL levels responded when they consumed different margarines.  The study found that participants' HDL cholesterol levels were 10% lower after consuming margarine containing partially hydrogenated soybean oil, compared to their levels after consuming palm oil (58).  Another controlled study followed 40 adults who had diets high in different types of trans fats.  They found that HDL cholesterol levels in women were significantly lower after they consumed the diet high in industrial trans fats, compared to the diet containing naturally occurring trans fats (59).  To protect heart health and keep HDL cholesterol in the healthy range,  it's best to avoid artificial trans fats altogether.  Bottom line: Artificial trans fats have been shown to lower HDL levels and increase inflammation, compared to other fats.  Take home message  Although your HDL cholesterol levels are partly determined by your genetics, there are many things you can do to naturally increase your own levels.  Fortunately, the practices that raise HDL cholesterol often provide other health benefits as well.     Guidelines for a Low Cholesterol, Low Saturated Fat Diet   Fats - Limit total intake of fats and oils. - Avoid butter, stick margarine, shortening, lard, palm and coconut oils. - Limit mayonnaise, salad dressings, gravies and sauces, unless they are homemade with low-fat ingredients. - Limit chocolate. - Choose low-fat and nonfat products, such as low-fat mayonnaise, low-fat or non-hydrogenated peanut butter, low-fat or fat-free salad dressings and nonfat gravy. - Use vegetable oil, such as canola or olive oil. - Look for margarine that does not contain trans fatty acids. - Use nuts in moderate amounts. - Read ingredient labels carefully to determine both amount and  type of fat present in foods. Limit saturated and trans fats! - Avoid high-fat processed and convenience foods.  Meats and Meat Alternatives - Choose fish, chicken, [place] and lean meats. - Use dried beans, peas, lentils and tofu. - Limit egg yolks to three to four per week. - If you eat red meat, limit to no more than three servings per week and choose loin or round cuts. - Avoid fatty meats, such as bacon, sausage, franks, luncheon meats and ribs. - Avoid all organ meats, including liver.  Dairy - Choose nonfat or low-fat milk, yogurt and cottage cheese. - Most cheeses are high in fat. Choose cheeses made from non-fat milk, such as mozzarella and ricotta cheese. - Choose light or fat-free cream cheese and sour cream. - Avoid cream and sauces made with cream.  Fruits and  Vegetables - Eat a wide variety of fruits and vegetables. - Use lemon juice, vinegar or "mist" olive oil on vegetables. - Avoid adding sauces, fat or oil to vegetables.  Breads, Cereals and Grains - Choose whole-grain breads, cereals, pastas and rice. - Avoid high-fat snack foods, such as granola, cookies, pies, pastries, doughnuts and croissants.  Cooking Tips - Avoid deep fried foods. - Trim visible fat off meats and remove skin from poultry before cooking. - Bake, broil, boil, poach or roast poultry, fish and lean meats. - Drain and discard fat that drains out of meat as you cook it. - Add little or no fat to foods. - Use vegetable oil sprays to grease pans for cooking or baking. - Steam vegetables. - Use herbs or no-oil marinades to flavor foods.

## 2016-01-24 DIAGNOSIS — E559 Vitamin D deficiency, unspecified: Secondary | ICD-10-CM | POA: Insufficient documentation

## 2016-01-24 NOTE — Assessment & Plan Note (Signed)
Avoid sugars/ carbs, esp those with a lot fats, dec EOTH intake etc.  Exercise more

## 2016-01-24 NOTE — Assessment & Plan Note (Addendum)
Less than 7.5% 10-yr risk  Dietary changes such as low saturated & trans fat and low carb/ ketogenic diets discussed with patient.    Encouraged regular exercise   Educational handouts provided at patient's desire.  Contact us prior with any Q's/ concerns.

## 2016-01-24 NOTE — Assessment & Plan Note (Signed)
Uses phenteramine 3 months/ year or so to keep wt off..  

## 2016-01-24 NOTE — Assessment & Plan Note (Signed)
Take 1/2 or less of your supp.   B12 too hgih

## 2016-01-24 NOTE — Assessment & Plan Note (Signed)
Inc daily to 5k IU QD.    Reck 6-12 mo

## 2016-06-08 ENCOUNTER — Encounter: Payer: Self-pay | Admitting: Family Medicine

## 2016-06-14 ENCOUNTER — Other Ambulatory Visit (INDEPENDENT_AMBULATORY_CARE_PROVIDER_SITE_OTHER): Payer: 59

## 2016-06-14 VITALS — BP 113/67 | Ht 67.25 in | Wt 169.9 lb

## 2016-06-14 DIAGNOSIS — E782 Mixed hyperlipidemia: Secondary | ICD-10-CM

## 2016-06-14 DIAGNOSIS — R748 Abnormal levels of other serum enzymes: Secondary | ICD-10-CM | POA: Diagnosis not present

## 2016-06-14 DIAGNOSIS — E559 Vitamin D deficiency, unspecified: Secondary | ICD-10-CM

## 2016-06-14 DIAGNOSIS — E786 Lipoprotein deficiency: Secondary | ICD-10-CM

## 2016-06-14 DIAGNOSIS — E781 Pure hyperglyceridemia: Secondary | ICD-10-CM

## 2016-06-14 DIAGNOSIS — E663 Overweight: Secondary | ICD-10-CM

## 2016-06-15 ENCOUNTER — Telehealth: Payer: Self-pay

## 2016-06-15 LAB — SPECIMEN STATUS

## 2016-06-15 LAB — VITAMIN D 25 HYDROXY (VIT D DEFICIENCY, FRACTURES): Vit D, 25-Hydroxy: 22.9 ng/mL — ABNORMAL LOW (ref 30.0–100.0)

## 2016-06-15 LAB — LIPID PANEL
CHOL/HDL RATIO: 5.9 ratio — AB (ref 0.0–4.4)
Cholesterol, Total: 260 mg/dL — ABNORMAL HIGH (ref 100–199)
HDL: 44 mg/dL (ref >39)
LDL CALC: 162 mg/dL — AB (ref 0–99)
TRIGLYCERIDES: 268 mg/dL — AB (ref 0–149)
VLDL Cholesterol Cal: 54 mg/dL — ABNORMAL HIGH (ref 5–40)

## 2016-06-15 LAB — BASIC METABOLIC PANEL
BUN / CREAT RATIO: 14 (ref 9–23)
BUN: 11 mg/dL (ref 6–24)
CO2: 23 mmol/L (ref 18–29)
CREATININE: 0.78 mg/dL (ref 0.57–1.00)
Calcium: 9 mg/dL (ref 8.7–10.2)
Chloride: 104 mmol/L (ref 96–106)
GFR calc Af Amer: 96 mL/min/{1.73_m2} (ref >59)
GFR calc non Af Amer: 83 mL/min/{1.73_m2} (ref >59)
GLUCOSE: 110 mg/dL — AB (ref 65–99)
POTASSIUM: 4.6 mmol/L (ref 3.5–5.2)
SODIUM: 141 mmol/L (ref 134–144)

## 2016-06-15 LAB — VITAMIN B12

## 2016-06-15 NOTE — Telephone Encounter (Signed)
Informed patient her form was faxed.  Copy will be scanned into chart.

## 2016-06-22 ENCOUNTER — Other Ambulatory Visit: Payer: 59

## 2016-08-24 ENCOUNTER — Other Ambulatory Visit: Payer: Self-pay | Admitting: Obstetrics and Gynecology

## 2016-09-02 ENCOUNTER — Encounter (HOSPITAL_COMMUNITY)
Admission: RE | Admit: 2016-09-02 | Discharge: 2016-09-02 | Disposition: A | Discharge status: Home / Self Care | Payer: 59 | Source: Ambulatory Visit | Attending: Obstetrics and Gynecology | Admitting: Obstetrics and Gynecology

## 2016-09-02 ENCOUNTER — Other Ambulatory Visit: Payer: Self-pay

## 2016-09-02 ENCOUNTER — Encounter (HOSPITAL_COMMUNITY): Payer: Self-pay

## 2016-09-02 DIAGNOSIS — Z01812 Encounter for preprocedural laboratory examination: Secondary | ICD-10-CM | POA: Diagnosis not present

## 2016-09-02 DIAGNOSIS — Z01810 Encounter for preprocedural cardiovascular examination: Secondary | ICD-10-CM | POA: Insufficient documentation

## 2016-09-02 DIAGNOSIS — R9431 Abnormal electrocardiogram [ECG] [EKG]: Secondary | ICD-10-CM | POA: Diagnosis not present

## 2016-09-02 LAB — BASIC METABOLIC PANEL
ANION GAP: 8 (ref 5–15)
BUN: 16 mg/dL (ref 6–20)
CHLORIDE: 103 mmol/L (ref 101–111)
CO2: 28 mmol/L (ref 22–32)
Calcium: 9.1 mg/dL (ref 8.9–10.3)
Creatinine, Ser: 0.82 mg/dL (ref 0.44–1.00)
GFR calc non Af Amer: >60 mL/min (ref >60)
Glucose, Bld: 103 mg/dL — ABNORMAL HIGH (ref 65–99)
POTASSIUM: 4.2 mmol/L (ref 3.5–5.1)
SODIUM: 139 mmol/L (ref 135–145)

## 2016-09-02 LAB — CBC
HEMATOCRIT: 38.8 % (ref 36.0–46.0)
HEMOGLOBIN: 12.9 g/dL (ref 12.0–15.0)
MCH: 30.9 pg (ref 26.0–34.0)
MCHC: 33.2 g/dL (ref 30.0–36.0)
MCV: 93 fL (ref 78.0–100.0)
Platelets: 388 10*3/uL (ref 150–400)
RBC: 4.17 MIL/uL (ref 3.87–5.11)
RDW: 12.9 % (ref 11.5–15.5)
WBC: 7 10*3/uL (ref 4.0–10.5)

## 2016-09-02 NOTE — Pre-Procedure Instructions (Signed)
DR. C. JACKSON VIEWED EKG AND OKAY'D 

## 2016-09-02 NOTE — Patient Instructions (Signed)
Your procedure is scheduled on: Thursday August 23,2018 at 12:45  Enter through the Main Entrance of [facility] at: 11:15  Pick up the phone at the desk and dial [phone].  Call this number if you have problems the morning of surgery: [phone].   Remember: Do NOT eat food: after Midnight on Wednesday August 22 Do NOT drink clear liquids after: 6 am Take these medicines the morning of surgery with a SIP OF WATER: [place], estradiol  STOP ALL VITAMINS AND SUPPLEMENTS TODAY  Do NOT wear jewelry (body piercing), metal hair clips/bobby pins, make-up, or nail polish. Do NOT wear lotions, powders, or perfumes.  You may wear deoderant. Do NOT shave for 48 hours prior to surgery. Do NOT bring valuables to the hospital. Contacts, dentures, or bridgework may not be worn into surgery. Leave suitcase in car.  After surgery it may be brought to your room.  For patients admitted to the hospital, checkout time is 11:00 AM the day of discharge.

## 2016-09-07 NOTE — H&P (Signed)
NAMEMarland Newton  CHESSIE, BENKERT NO.:  [id number]  MEDICAL RECORD NO.:  [id number]  LOCATION:  [facility]                         FACILITY:  [facility]  PHYSICIAN:  Lenoard Aden, M.D.DATE OF BIRTH:  Jul 05, 1955  DATE OF ADMISSION:  07/12/2016 DATE OF DISCHARGE:                             HISTORY & PHYSICAL   CHIEF COMPLAINT:  Symptomatic pelvic relaxation with cystocele, rectocele.  HISTORY OF PRESENT ILLNESS:  She is a 61 year old white female G2, P2, status post vaginal hysterectomy many years ago with symptomatic cystocele, rectocele.  No stress incontinence with some inability to defecate without pressure put into the vagina.  ALLERGIES:  She has no known drug allergies.  MEDICATIONS:  Zovirax, estradiol, hydrochlorothiazide, vitamin, Otezla, and B12.  MEDICAL PROBLEMS:  Include hypertriglyceridemia, pelvic relaxation, previous history of tobacco abuse, psoriasis, and depression.  PHYSICAL EXAMINATION:  GENERAL:  She is a well-developed, well-nourished white female in no acute distress. HEENT:  Normal. NECK:  Supple.  Full range of motion. LUNGS:  Clear. HEART:  Regular rate and rhythm. ABDOMEN:  Soft and nontender. PELVIC:  Reveals a well-supported vaginal apex, absent uterus.  No pelvic masses.  Cystocele grade 1-2.  Rectocele grade 2 with perineal relaxation. EXTREMITIES:  No cords. NEUROLOGIC:  Nonfocal. SKIN:  Intact.  IMPRESSION:  Symptomatic pelvic relaxation.  No evidence of stress incontinence.  PLAN:  Proceed with cystocele repair, rectocele repair, perineorrhaphy, possible enterocele repair.  Risks of anesthesia, infection, bleeding, injury to surrounding organs, possible need for repair discussed, inability to cure all symptomatology is also discussed.  The possible postoperative complications include new onset urgency, incontinence and possible stress incontinence discussed.  The patient acknowledges and wishes to proceed.     Lenoard Aden, M.D.     RJT/MEDQ  D:  09/07/2016  T:  09/07/2016  Job:  [id number]

## 2016-09-08 ENCOUNTER — Ambulatory Visit (HOSPITAL_COMMUNITY): Payer: 59 | Admitting: Certified Registered Nurse Anesthetist

## 2016-09-08 ENCOUNTER — Ambulatory Visit (HOSPITAL_COMMUNITY)
Admission: RE | Admit: 2016-09-08 | Discharge: 2016-09-08 | Disposition: A | Discharge status: Home / Self Care | Payer: 59 | Source: Ambulatory Visit | Attending: Obstetrics and Gynecology | Admitting: Obstetrics and Gynecology

## 2016-09-08 ENCOUNTER — Encounter (HOSPITAL_COMMUNITY): Payer: Self-pay | Admitting: *Deleted

## 2016-09-08 ENCOUNTER — Encounter (HOSPITAL_COMMUNITY)
Admission: RE | Disposition: A | Discharge status: Home / Self Care | Payer: Self-pay | Source: Ambulatory Visit | Attending: Obstetrics and Gynecology

## 2016-09-08 DIAGNOSIS — Z79899 Other long term (current) drug therapy: Secondary | ICD-10-CM | POA: Insufficient documentation

## 2016-09-08 DIAGNOSIS — Z79890 Hormone replacement therapy (postmenopausal): Secondary | ICD-10-CM | POA: Insufficient documentation

## 2016-09-08 DIAGNOSIS — E781 Pure hyperglyceridemia: Secondary | ICD-10-CM | POA: Diagnosis not present

## 2016-09-08 DIAGNOSIS — N815 Vaginal enterocele: Secondary | ICD-10-CM | POA: Insufficient documentation

## 2016-09-08 DIAGNOSIS — N816 Rectocele: Secondary | ICD-10-CM | POA: Insufficient documentation

## 2016-09-08 DIAGNOSIS — Z90710 Acquired absence of both cervix and uterus: Secondary | ICD-10-CM | POA: Diagnosis not present

## 2016-09-08 DIAGNOSIS — Z87891 Personal history of nicotine dependence: Secondary | ICD-10-CM | POA: Insufficient documentation

## 2016-09-08 DIAGNOSIS — N8110 Cystocele, unspecified: Secondary | ICD-10-CM | POA: Insufficient documentation

## 2016-09-08 DIAGNOSIS — L409 Psoriasis, unspecified: Secondary | ICD-10-CM | POA: Diagnosis not present

## 2016-09-08 DIAGNOSIS — N8189 Other female genital prolapse: Secondary | ICD-10-CM | POA: Diagnosis present

## 2016-09-08 HISTORY — PX: ANTERIOR AND POSTERIOR REPAIR: SHX5121

## 2016-09-08 SURGERY — ANTERIOR (CYSTOCELE) AND POSTERIOR REPAIR (RECTOCELE)
Anesthesia: General | Site: Vagina

## 2016-09-08 MED ORDER — SCOPOLAMINE 1 MG/3DAYS TD PT72
1.0000 | MEDICATED_PATCH | Freq: Once | TRANSDERMAL | Status: DC
  Administered 2016-09-08: 1.5 mg via TRANSDERMAL

## 2016-09-08 MED ORDER — GLYCOPYRROLATE 0.2 MG/ML IJ SOLN
INTRAMUSCULAR | Status: DC | PRN
  Administered 2016-09-08 (×2): 0.1 mg via INTRAVENOUS

## 2016-09-08 MED ORDER — KETOROLAC TROMETHAMINE 30 MG/ML IJ SOLN
INTRAMUSCULAR | Status: AC
  Filled 2016-09-08: qty 1

## 2016-09-08 MED ORDER — GLYCOPYRROLATE 0.2 MG/ML IJ SOLN
INTRAMUSCULAR | Status: AC
  Filled 2016-09-08: qty 1

## 2016-09-08 MED ORDER — PROMETHAZINE HCL 25 MG/ML IJ SOLN: 6.2500 mg | INTRAMUSCULAR | Status: DC | PRN

## 2016-09-08 MED ORDER — OXYCODONE HCL 5 MG/5ML PO SOLN: 5.0000 mg | Freq: Once | ORAL | Status: DC | PRN

## 2016-09-08 MED ORDER — ONDANSETRON HCL 4 MG/2ML IJ SOLN
INTRAMUSCULAR | Status: AC
  Filled 2016-09-08: qty 2

## 2016-09-08 MED ORDER — LIDOCAINE HCL (CARDIAC) 20 MG/ML IV SOLN
INTRAVENOUS | Status: AC
  Filled 2016-09-08: qty 5

## 2016-09-08 MED ORDER — PROPOFOL 10 MG/ML IV BOLUS
INTRAVENOUS | Status: AC
  Filled 2016-09-08: qty 20

## 2016-09-08 MED ORDER — OXYCODONE HCL 5 MG PO TABS: 5.0000 mg | ORAL_TABLET | Freq: Once | ORAL | Status: DC | PRN

## 2016-09-08 MED ORDER — MIDAZOLAM HCL 2 MG/2ML IJ SOLN
INTRAMUSCULAR | Status: DC | PRN
  Administered 2016-09-08: 1 mg via INTRAVENOUS

## 2016-09-08 MED ORDER — ESTRADIOL 0.1 MG/GM VA CREA
TOPICAL_CREAM | VAGINAL | Status: AC
  Filled 2016-09-08: qty 42.5

## 2016-09-08 MED ORDER — ONDANSETRON HCL 4 MG/2ML IJ SOLN
INTRAMUSCULAR | Status: DC | PRN
Start: 2016-09-08 — End: 2016-09-08
  Administered 2016-09-08: 4 mg via INTRAVENOUS

## 2016-09-08 MED ORDER — ARTIFICIAL TEARS OPHTHALMIC OINT
TOPICAL_OINTMENT | OPHTHALMIC | Status: AC
  Filled 2016-09-08: qty 3.5

## 2016-09-08 MED ORDER — ROCURONIUM BROMIDE 100 MG/10ML IV SOLN
INTRAVENOUS | Status: AC
  Filled 2016-09-08: qty 1

## 2016-09-08 MED ORDER — FENTANYL CITRATE (PF) 100 MCG/2ML IJ SOLN
INTRAMUSCULAR | Status: DC | PRN
  Administered 2016-09-08 (×2): 25 ug via INTRAVENOUS
  Administered 2016-09-08 (×2): 50 ug via INTRAVENOUS
  Administered 2016-09-08: 25 ug via INTRAVENOUS

## 2016-09-08 MED ORDER — CEFAZOLIN SODIUM-DEXTROSE 2-4 GM/100ML-% IV SOLN: 2.0000 g | INTRAVENOUS | Status: DC

## 2016-09-08 MED ORDER — CEFAZOLIN SODIUM-DEXTROSE 2-3 GM-% IV SOLR
INTRAVENOUS | Status: DC | PRN
  Administered 2016-09-08: 2 g via INTRAVENOUS

## 2016-09-08 MED ORDER — MEPERIDINE HCL 25 MG/ML IJ SOLN: 6.2500 mg | INTRAMUSCULAR | Status: DC | PRN

## 2016-09-08 MED ORDER — OXYCODONE-ACETAMINOPHEN 5-325 MG PO TABS: 1.0000 | ORAL_TABLET | ORAL | 0 refills | Status: DC | PRN

## 2016-09-08 MED ORDER — SCOPOLAMINE 1 MG/3DAYS TD PT72
MEDICATED_PATCH | TRANSDERMAL | Status: AC
  Administered 2016-09-08: 1.5 mg via TRANSDERMAL
  Filled 2016-09-08: qty 1

## 2016-09-08 MED ORDER — FENTANYL CITRATE (PF) 250 MCG/5ML IJ SOLN
INTRAMUSCULAR | Status: AC
  Filled 2016-09-08: qty 5

## 2016-09-08 MED ORDER — HYDROMORPHONE HCL 1 MG/ML IJ SOLN
INTRAMUSCULAR | Status: AC
  Filled 2016-09-08: qty 1

## 2016-09-08 MED ORDER — DEXAMETHASONE SODIUM PHOSPHATE 10 MG/ML IJ SOLN
INTRAMUSCULAR | Status: AC
  Filled 2016-09-08: qty 1

## 2016-09-08 MED ORDER — VASOPRESSIN 20 UNIT/ML IV SOLN
INTRAVENOUS | Status: DC | PRN
  Administered 2016-09-08: 18 mL via INTRAMUSCULAR

## 2016-09-08 MED ORDER — PROPOFOL 10 MG/ML IV BOLUS
INTRAVENOUS | Status: DC | PRN
Start: 2016-09-08 — End: 2016-09-08
  Administered 2016-09-08: 200 mg via INTRAVENOUS

## 2016-09-08 MED ORDER — PHENYLEPHRINE 40 MCG/ML (10ML) SYRINGE FOR IV PUSH (FOR BLOOD PRESSURE SUPPORT)
PREFILLED_SYRINGE | INTRAVENOUS | Status: AC
Start: 2016-09-08 — End: 2016-09-08
  Filled 2016-09-08: qty 10

## 2016-09-08 MED ORDER — LIDOCAINE HCL (CARDIAC) 20 MG/ML IV SOLN
INTRAVENOUS | Status: DC | PRN
  Administered 2016-09-08: 100 mg via INTRAVENOUS

## 2016-09-08 MED ORDER — MIDAZOLAM HCL 2 MG/2ML IJ SOLN
INTRAMUSCULAR | Status: AC
  Filled 2016-09-08: qty 2

## 2016-09-08 MED ORDER — KETOROLAC TROMETHAMINE 30 MG/ML IJ SOLN
INTRAMUSCULAR | Status: DC | PRN
  Administered 2016-09-08: 30 mg via INTRAVENOUS

## 2016-09-08 MED ORDER — HYDROMORPHONE HCL 1 MG/ML IJ SOLN
0.2500 mg | INTRAMUSCULAR | Status: DC | PRN
  Administered 2016-09-08 (×2): 0.5 mg via INTRAVENOUS

## 2016-09-08 MED ORDER — LACTATED RINGERS IV SOLN
INTRAVENOUS | Status: DC
  Administered 2016-09-08 (×2): via INTRAVENOUS

## 2016-09-08 MED ORDER — SODIUM CHLORIDE 0.9 % IJ SOLN
INTRAMUSCULAR | Status: AC
  Filled 2016-09-08: qty 50

## 2016-09-08 MED ORDER — DEXAMETHASONE SODIUM PHOSPHATE 10 MG/ML IJ SOLN
INTRAMUSCULAR | Status: DC | PRN
  Administered 2016-09-08: 10 mg via INTRAVENOUS

## 2016-09-08 MED ORDER — VASOPRESSIN 20 UNIT/ML IV SOLN
INTRAVENOUS | Status: AC
  Filled 2016-09-08: qty 1

## 2016-09-08 SURGICAL SUPPLY — 25 items
CLOTH BEACON ORANGE TIMEOUT ST (SAFETY) ×3 IMPLANT
CONT PATH 16OZ SNAP LID 3702 (MISCELLANEOUS) IMPLANT
DECANTER SPIKE VIAL GLASS SM (MISCELLANEOUS) ×3 IMPLANT
ELECT NEEDLE TIP 2.8 STRL (NEEDLE) IMPLANT
GAUZE PACKING 1 X5 YD ST (GAUZE/BANDAGES/DRESSINGS) IMPLANT
GAUZE PACKING 2X5 YD STRL (GAUZE/BANDAGES/DRESSINGS) IMPLANT
GLOVE BIO SURGEON STRL SZ7.5 (GLOVE) ×6 IMPLANT
GLOVE BIOGEL PI IND STRL 7.0 (GLOVE) ×1 IMPLANT
GLOVE BIOGEL PI INDICATOR 7.0 (GLOVE) ×2
GLOVE ECLIPSE 6.5 STRL STRAW (GLOVE) ×3 IMPLANT
GLOVE INDICATOR 7.0 STRL GRN (GLOVE) ×6 IMPLANT
GLOVE INDICATOR 8.0 STRL GRN (GLOVE) ×6 IMPLANT
GOWN STRL REUS W/TWL LRG LVL3 (GOWN DISPOSABLE) ×12 IMPLANT
NEEDLE HYPO 22GX1.5 SAFETY (NEEDLE) IMPLANT
NS IRRIG 1000ML POUR BTL (IV SOLUTION) ×3 IMPLANT
PACK VAGINAL WOMENS (CUSTOM PROCEDURE TRAY) ×3 IMPLANT
SUT MON AB 2-0 CT2 27 (SUTURE) ×6 IMPLANT
SUT VIC AB 0 CT1 27 (SUTURE)
SUT VIC AB 0 CT1 27XBRD ANBCTR (SUTURE) IMPLANT
SUT VIC AB 0 CT3 27 (SUTURE) ×3 IMPLANT
SUT VIC AB 2-0 CT1 27 (SUTURE) ×12
SUT VIC AB 2-0 CT1 TAPERPNT 27 (SUTURE) ×6 IMPLANT
SUT VICRYL 3 0 CT 3 (SUTURE) IMPLANT
TOWEL OR 17X24 6PK STRL BLUE (TOWEL DISPOSABLE) ×6 IMPLANT
TRAY FOLEY CATH SILVER 14FR (SET/KITS/TRAYS/PACK) ×3 IMPLANT

## 2016-09-08 NOTE — Op Note (Signed)
NAMEMarland Newton  ZADE, TURNIER NO.:  [id number]  MEDICAL RECORD NO.:  [id number]  LOCATION:  [facility]                          [facility]ACILITY:  [facility]  PHYSICIAN:  Lenoard Aden, M.D.DATE OF BIRTH:  1955-06-15  DATE OF PROCEDURE: DATE OF DISCHARGE:                              OPERATIVE REPORT   PREOPERATIVE DIAGNOSIS:  Symptomatic pelvic relaxation with cystocele, rectocele, enterocele, and perineal relaxation.  POSTOPERATIVE DIAGNOSES:  Symptomatic pelvic relaxation with cystocele, rectocele, enterocele, and perineal relaxation.  PROCEDURES:  Anterior colporrhaphy, Kelly plication, posterior colporrhaphy, enterocele repair, and perineorrhaphy.  SURGEON:  Lenoard Aden, M.D.  ASSISTANT:  Fredric Mare.  ESTIMATED BLOOD LOSS:  Less than 50 mL.  COMPLICATIONS:  None.  DRAINS:  Foley.  COUNTS:  Correct.  DISPOSITION:  The patient to recovery in good condition.  SPECIMEN:  None.  BRIEF OPERATIVE NOTE:  After being apprised of risks of anesthesia, infection, bleeding, injury to surrounding organs, possible need for repair, delayed versus immediate complications to include bowel and bladder injury, possible need for repair, possible postoperative complications to include difficulty voiding and urgency, incontinence. The patient was brought to the operating room where she was administered general anesthetic without complications.  Prepped and draped in usual sterile fashion.  Foley catheter placed.  The ureterovesical junction was identified by nature of the Foley bulb.  The vaginal wall was grasped at the level of the mid urethra and cystocele was identified.  A vertical incision was made over the anterior vaginal wall after placement of Pitressin solution.  Dissection of the anterior vaginal mucosa off the pubovesical cervical fascia was done without difficulty. Center cystocele was reduced without difficulty.  A Kennedy-Kelly plication stitch was placed in the  appropriate location using a 2-0 Vicryl suture.  The cystocele was reduced using multiple plication sutures.  The vagina was trimmed and the defect was closed using a 2-0 Vicryl suture in an interrupted fashion.  Good hemostasis was noted. Attention turned to the rectocele whereby the anterior portion of the rectocele was identified proximally with a finger in the rectum.  The perineal repair was identified.  The area was infiltrated with a dilute Pitressin solution.  A triangular portion of perineum was excised and the posterior vaginal wall was undermined and enterocele was accounted at the cephalad portion.  This was imbricated using a plication suture of 2-0 Vicryl x2.  The puborectal fascia was dissected sharply off the posterior vaginal wall and the rectocele was reduced, plicated using interrupted 2-0 Vicryl sutures.  Perineal body was rebuilt using interrupted 2-0 Vicryl suture.  Vagina was trimmed and closed. Perineorrhaphy was repaired as well.  The vaginal wall was closed using a 2-0 Vicryl interrupted suture and the perineal repair was done using a 2-0 Vicryl suture.  Good hemostasis was noted.  Rectal exam confirms well-supported posterior vault.  Urine was clear.  The patient tolerated the procedure well, was awakened and transferred to recovery in good condition.     Lenoard Aden, M.D.     RJT/MEDQ  D:  09/08/2016  T:  09/08/2016  Job:  [id number]  cc:   Lenoard Aden, M.D. Fax: [phone]

## 2016-09-08 NOTE — Anesthesia Procedure Notes (Signed)
Procedure Name: LMA Insertion Date/Time: 09/08/2016 12:49 PM Performed by: Earmon Phoenix Pre-anesthesia Checklist: Patient identified, Emergency Drugs available, Suction available, Patient being monitored and Timeout performed Patient Re-evaluated:Patient Re-evaluated prior to induction Oxygen Delivery Method: Circle system utilized Preoxygenation: Pre-oxygenation with 100% oxygen Induction Type: IV induction Ventilation: Mask ventilation without difficulty LMA: LMA inserted LMA Size: 4.0 Number of attempts: 1 Placement Confirmation: breath sounds checked- equal and bilateral,  CO2 detector and positive ETCO2 Tube secured with: Tape Dental Injury: Teeth and Oropharynx as per pre-operative assessment

## 2016-09-08 NOTE — Anesthesia Postprocedure Evaluation (Signed)
Anesthesia Post Note  Patient: Ann Newton  Procedure(s) Performed: Procedure(s) (LRB): ANTERIOR (CYSTOCELE) AND POSTERIOR REPAIR (RECTOCELE)/Perineorrhaphy (N/A)     Patient location during evaluation: PACU Anesthesia Type: General Level of consciousness: awake and alert Pain management: pain level controlled Vital Signs Assessment: post-procedure vital signs reviewed and stable Respiratory status: spontaneous breathing, nonlabored ventilation and respiratory function stable Cardiovascular status: blood pressure returned to baseline and stable Postop Assessment: no signs of nausea or vomiting Anesthetic complications: no    Last Vitals:  Vitals:   09/08/16 1515 09/08/16 1545  BP: 106/66 (!) 110/53  Pulse: 83 89  Resp: 17 17  Temp:    SpO2: 98% 94%    Last Pain:  Vitals:   09/08/16 1545  TempSrc:   PainSc: 2    Pain Goal: Patients Stated Pain Goal: 3 (09/08/16 1117)               Lowella Curb

## 2016-09-08 NOTE — Anesthesia Preprocedure Evaluation (Signed)
Anesthesia Evaluation  Patient identified by MRN, date of birth, ID band Patient awake    Reviewed: Allergy & Precautions, H&P , NPO status , Patient's Chart, lab work & pertinent test results  Airway Mallampati: II  TM Distance: >3 FB Neck ROM: Full    Dental no notable dental hx.    Pulmonary former smoker,    Pulmonary exam normal breath sounds clear to auscultation       Cardiovascular Normal cardiovascular exam Rhythm:Regular Rate:Normal     Neuro/Psych    GI/Hepatic   Endo/Other    Renal/GU      Musculoskeletal   Abdominal   Peds  Hematology   Anesthesia Other Findings   Reproductive/Obstetrics                             Anesthesia Physical  Anesthesia Plan  ASA: II  Anesthesia Plan: General   Post-op Pain Management:    Induction: Intravenous  PONV Risk Score and Plan: 2 and Ondansetron, Midazolam and Treatment may vary due to age or medical condition  Airway Management Planned: Oral ETT  Additional Equipment:   Intra-op Plan:   Post-operative Plan: Extubation in OR  Informed Consent: I have reviewed the patients History and Physical, chart, labs and discussed the procedure including the risks, benefits and alternatives for the proposed anesthesia with the patient or authorized representative who has indicated his/her understanding and acceptance.     Plan Discussed with: CRNA, Anesthesiologist and Surgeon  Anesthesia Plan Comments:         Anesthesia Quick Evaluation

## 2016-09-08 NOTE — Transfer of Care (Signed)
Immediate Anesthesia Transfer of Care Note  Patient: Ann Newton  Procedure(s) Performed: Procedure(s) with comments: ANTERIOR (CYSTOCELE) AND POSTERIOR REPAIR (RECTOCELE)/Perineorrhaphy (N/A) - Requests . Repair of enterocele/posterior  Patient Location: PACU  Anesthesia Type:General  Level of Consciousness: awake and patient cooperative  Airway & Oxygen Therapy: Patient Spontanous Breathing and Patient connected to nasal cannula oxygen  Post-op Assessment: Report given to RN and Post -op Vital signs reviewed and stable  Post vital signs: Reviewed and stable  Last Vitals:  Vitals:   09/08/16 1117  BP: 120/62  Pulse: 79  Resp: 18  Temp: 36.8 C  SpO2: 98%    Last Pain:  Vitals:   09/08/16 1117  TempSrc: Oral      Patients Stated Pain Goal: 3 (09/08/16 1117)  Complications: No apparent anesthesia complications

## 2016-09-08 NOTE — Progress Notes (Signed)
Patient ID: Ann Newton, female   DOB: [birth year], 61 y.o.   MRN: [id number]  Patient seen and examined. Consent witnessed and signed. No changes noted. Update completed.

## 2016-09-08 NOTE — Op Note (Signed)
09/08/2016  1:47 PM  PATIENT:  Ann Newton  61 y.o. female  PRE-OPERATIVE DIAGNOSIS:  Symptomatic Pelvic Relaxation  POST-OPERATIVE DIAGNOSIS:  Symptomatic Pelvic Relaxation Cystocele, rectocele and enterocele  PROCEDURE:  Procedure(s): ANTERIOR (CYSTOCELE) with  Tresa Endo plication POSTERIOR REPAIR (RECTOCELE) Perineorrhaphy Enterocele repair  SURGEON:  Surgeon(s): Olivia Mackie, MD  ASSISTANTS: Fredric Mare, CNM   ANESTHESIA:   local and general  ESTIMATED BLOOD LOSS: minimal  DRAINS: Urinary Catheter (Foley)   LOCAL MEDICATIONS USED:  Amount: 20 ml and OTHER pitressin  SPECIMEN:  No Specimen  DISPOSITION OF SPECIMEN:  N/A  COUNTS:  YES  DICTATION #: [id number]  PLAN OF CARE: dc home  PATIENT DISPOSITION:  PACU - hemodynamically stable.

## 2016-09-08 NOTE — Discharge Instructions (Signed)
Anterior and Posterior Colporrhaphy, Care After Refer to this sheet in the next few weeks. These instructions provide you with information on caring for yourself after your procedure. Your health care provider may also give you more specific instructions. Your treatment has been planned according to current medical practices, but problems sometimes occur. Call your health care provider if you have any problems or questions after your procedure. Follow these instructions at home:  Take frequent rest periods throughout the day.  Only take over-the-counter or prescription medicines as directed by your health care provider.  Avoid strenuous activity such as heavy lifting (more than 10 pounds [4.5 kg]), pushing, and pulling until your health care provider says it is okay.  Take showers if your health care provider approves. Pat incisions dry. Do not rub incisions with a washcloth or towel. Do not take tub baths until your health care provider approves.  Wear compression stockings as directed by your health care provider. These stockings help prevent blood clots from forming in your legs.  Talk with your health care provider about when you may return to work and your exercise routine.  Do not drive until your health care provider approves.  You may resume your normal diet. Eat a well-balanced diet.  Drink enough fluids to keep your urine clear or pale yellow.  Your normal bowel function should return. If you become constipated, you may: ? Take a mild laxative. ? Add fruit and bran to your diet. ? Drink more fluids.  Do not have sexual intercourse until permitted by your health care provider.  Follow up with your health care provider as directed. Contact a health care provider if: You have persistent nausea or vomiting. Get help right away if:  You have increased bleeding (more than a small spot) from the vaginal area.  Your pain is not relieved with medicine or becomes worse.  You  have redness, swelling, or increasing pain in the vaginal area.  You have abdominal pain.  You see pus coming from the wounds.  You develop a fever.  You have a foul smell coming from your vaginal area.  You develop light-headedness or you feel faint.  You have difficulty breathing. This information is not intended to replace advice given to you by your health care provider. Make sure you discuss any questions you have with your health care provider. Document Released: 07/22/2004 Document Revised: 06/11/2015 Document Reviewed: 05/25/2012 Elsevier Interactive Patient Education  2017 [employer]     Post Anesthesia Home Care Instructions  Activity: Get plenty of rest for the remainder of the day. A responsible individual must stay with you for 24 hours following the procedure.  For the next 24 hours, DO NOT: -Drive a car -Advertising copywriter -Drink alcoholic beverages -Take any medication unless instructed by your physician -Make any legal decisions or sign important papers.  Meals: Start with liquid foods such as gelatin or soup. Progress to regular foods as tolerated. Avoid greasy, spicy, heavy foods. If nausea and/or vomiting occur, drink only clear liquids until the nausea and/or vomiting subsides. Call your physician if vomiting continues.  Special Instructions/Symptoms: Your throat may feel dry or sore from the anesthesia or the breathing tube placed in your throat during surgery. If this causes discomfort, gargle with warm salt water. The discomfort should disappear within 24 hours.  If you had a scopolamine patch placed behind your ear for the management of post- operative nausea and/or vomiting:  1. The medication in the patch is effective for  72 hours, after which it should be removed.  Wrap patch in a tissue and discard in the trash. Wash hands thoroughly with soap and water. 2. You may remove the patch earlier than 72 hours if you experience unpleasant side effects  which may include dry mouth, dizziness or visual disturbances. 3. Avoid touching the patch. Wash your hands with soap and water after contact with the patch.    NO IBUPROFEN PRODUCTS (MOTRIN, ADVIL) OR ALEVE UNTIL 7:4Opm TODAY.

## 2016-09-09 ENCOUNTER — Encounter (HOSPITAL_COMMUNITY): Payer: Self-pay | Admitting: Obstetrics and Gynecology

## 2017-11-22 ENCOUNTER — Encounter: Payer: Self-pay | Admitting: Family Medicine

## 2017-11-22 ENCOUNTER — Ambulatory Visit (INDEPENDENT_AMBULATORY_CARE_PROVIDER_SITE_OTHER): Payer: 59

## 2017-11-22 ENCOUNTER — Ambulatory Visit (INDEPENDENT_AMBULATORY_CARE_PROVIDER_SITE_OTHER): Payer: 59 | Admitting: Family Medicine

## 2017-11-22 VITALS — BP 128/70 | HR 92 | Ht 67.0 in | Wt 184.4 lb

## 2017-11-22 DIAGNOSIS — L403 Pustulosis palmaris et plantaris: Secondary | ICD-10-CM

## 2017-11-22 DIAGNOSIS — M25561 Pain in right knee: Secondary | ICD-10-CM

## 2017-11-22 DIAGNOSIS — G629 Polyneuropathy, unspecified: Secondary | ICD-10-CM | POA: Insufficient documentation

## 2017-11-22 DIAGNOSIS — E781 Pure hyperglyceridemia: Secondary | ICD-10-CM | POA: Diagnosis not present

## 2017-11-22 DIAGNOSIS — E7800 Pure hypercholesterolemia, unspecified: Secondary | ICD-10-CM

## 2017-11-22 DIAGNOSIS — M25562 Pain in left knee: Secondary | ICD-10-CM

## 2017-11-22 DIAGNOSIS — Z23 Encounter for immunization: Secondary | ICD-10-CM

## 2017-11-22 DIAGNOSIS — G8929 Other chronic pain: Secondary | ICD-10-CM

## 2017-11-22 DIAGNOSIS — E782 Mixed hyperlipidemia: Secondary | ICD-10-CM | POA: Diagnosis not present

## 2017-11-22 DIAGNOSIS — Z0001 Encounter for general adult medical examination with abnormal findings: Secondary | ICD-10-CM

## 2017-11-22 DIAGNOSIS — E538 Deficiency of other specified B group vitamins: Secondary | ICD-10-CM | POA: Diagnosis not present

## 2017-11-22 DIAGNOSIS — Z1211 Encounter for screening for malignant neoplasm of colon: Secondary | ICD-10-CM

## 2017-11-22 DIAGNOSIS — E559 Vitamin D deficiency, unspecified: Secondary | ICD-10-CM | POA: Diagnosis not present

## 2017-11-22 HISTORY — DX: Encounter for immunization: Z23

## 2017-11-22 HISTORY — DX: Polyneuropathy, unspecified: G62.9

## 2017-11-22 LAB — CBC
HCT: 40.4 % (ref 36.0–46.0)
HEMOGLOBIN: 13.5 g/dL (ref 12.0–15.0)
MCHC: 33.4 g/dL (ref 30.0–36.0)
MCV: 91.5 fl (ref 78.0–100.0)
PLATELETS: 436 10*3/uL — AB (ref 150.0–400.0)
RBC: 4.42 Mil/uL (ref 3.87–5.11)
RDW: 13.7 % (ref 11.5–15.5)
WBC: 6.4 10*3/uL (ref 4.0–10.5)

## 2017-11-22 LAB — URINALYSIS, ROUTINE W REFLEX MICROSCOPIC
Bilirubin Urine: NEGATIVE
HGB URINE DIPSTICK: NEGATIVE
Ketones, ur: NEGATIVE
Leukocytes, UA: NEGATIVE
NITRITE: NEGATIVE
RBC / HPF: NONE SEEN (ref 0–?)
Total Protein, Urine: NEGATIVE
Urine Glucose: NEGATIVE
Urobilinogen, UA: 0.2 (ref 0.0–1.0)
pH: 7 (ref 5.0–8.0)

## 2017-11-22 LAB — COMPREHENSIVE METABOLIC PANEL
ALT: 28 U/L (ref 0–35)
AST: 21 U/L (ref 0–37)
Albumin: 4.4 g/dL (ref 3.5–5.2)
Alkaline Phosphatase: 92 U/L (ref 39–117)
BILIRUBIN TOTAL: 0.3 mg/dL (ref 0.2–1.2)
BUN: 10 mg/dL (ref 6–23)
CALCIUM: 10 mg/dL (ref 8.4–10.5)
CO2: 28 meq/L (ref 19–32)
Chloride: 103 mEq/L (ref 96–112)
Creatinine, Ser: 0.79 mg/dL (ref 0.40–1.20)
GFR: 78.66 mL/min (ref >60.00)
Glucose, Bld: 101 mg/dL — ABNORMAL HIGH (ref 70–99)
Potassium: 4.9 mEq/L (ref 3.5–5.1)
Sodium: 141 mEq/L (ref 135–145)
Total Protein: 7.5 g/dL (ref 6.0–8.3)

## 2017-11-22 LAB — VITAMIN B12: VITAMIN B 12: 782 pg/mL (ref 211–911)

## 2017-11-22 LAB — LIPID PANEL
CHOL/HDL RATIO: 7
CHOLESTEROL: 320 mg/dL — AB (ref 0–200)
HDL: 47.2 mg/dL (ref >39.00)
NonHDL: 273.26
Triglycerides: 280 mg/dL — ABNORMAL HIGH (ref 0.0–149.0)
VLDL: 56 mg/dL — ABNORMAL HIGH (ref 0.0–40.0)

## 2017-11-22 LAB — TSH: TSH: 0.65 u[IU]/mL (ref 0.35–4.50)

## 2017-11-22 LAB — LDL CHOLESTEROL, DIRECT: LDL DIRECT: 225 mg/dL

## 2017-11-22 LAB — VITAMIN D 25 HYDROXY (VIT D DEFICIENCY, FRACTURES): VITD: 34.25 ng/mL (ref 30.00–100.00)

## 2017-11-22 IMAGING — DX DG KNEE COMPLETE 4+V*R*
5 series · 5 of 5 positions shown · non-contrast
Comparison: MR LEFT knee [DATE]

CLINICAL DATA: Arthritis in both knees, chronic knee pain

EXAM:
LEFT KNEE - COMPLETE 4+ VIEW; RIGHT KNEE - COMPLETE 4+ VIEW

[knee ap (1 of 2)]
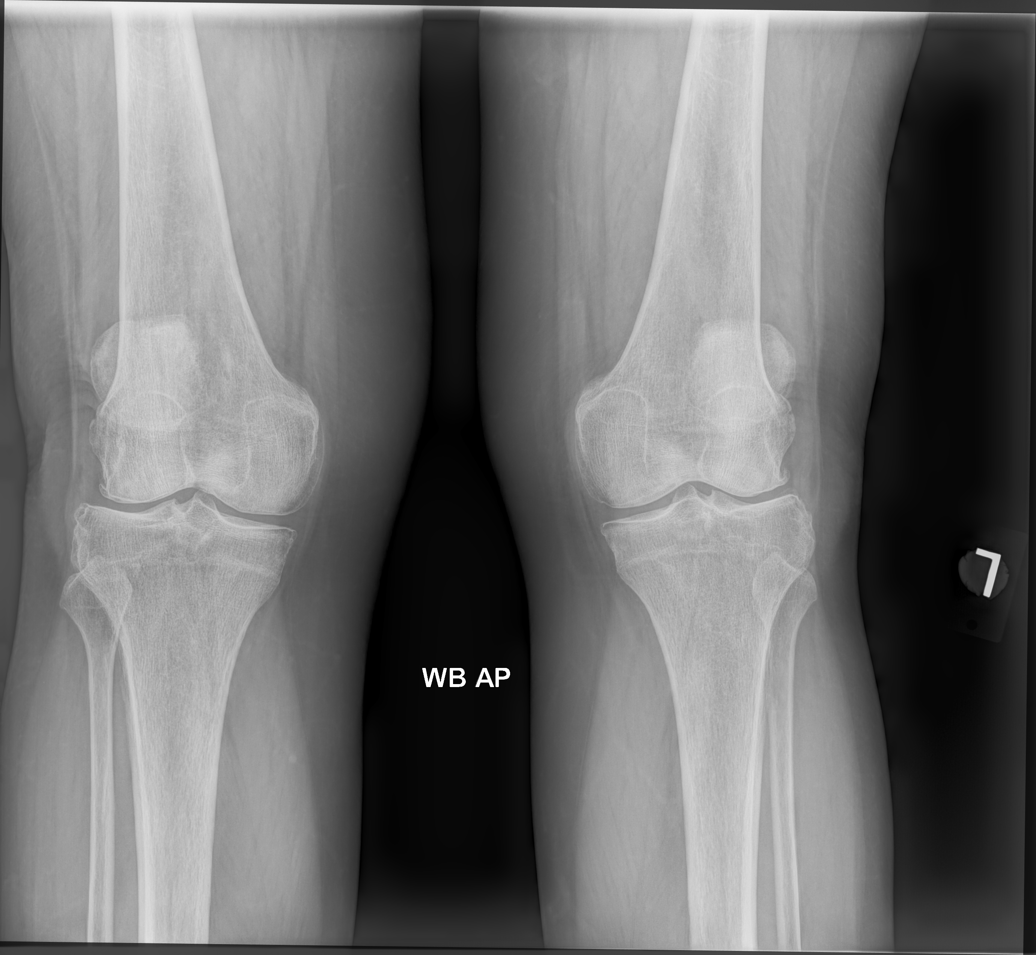

[knee [person_name] view pa]
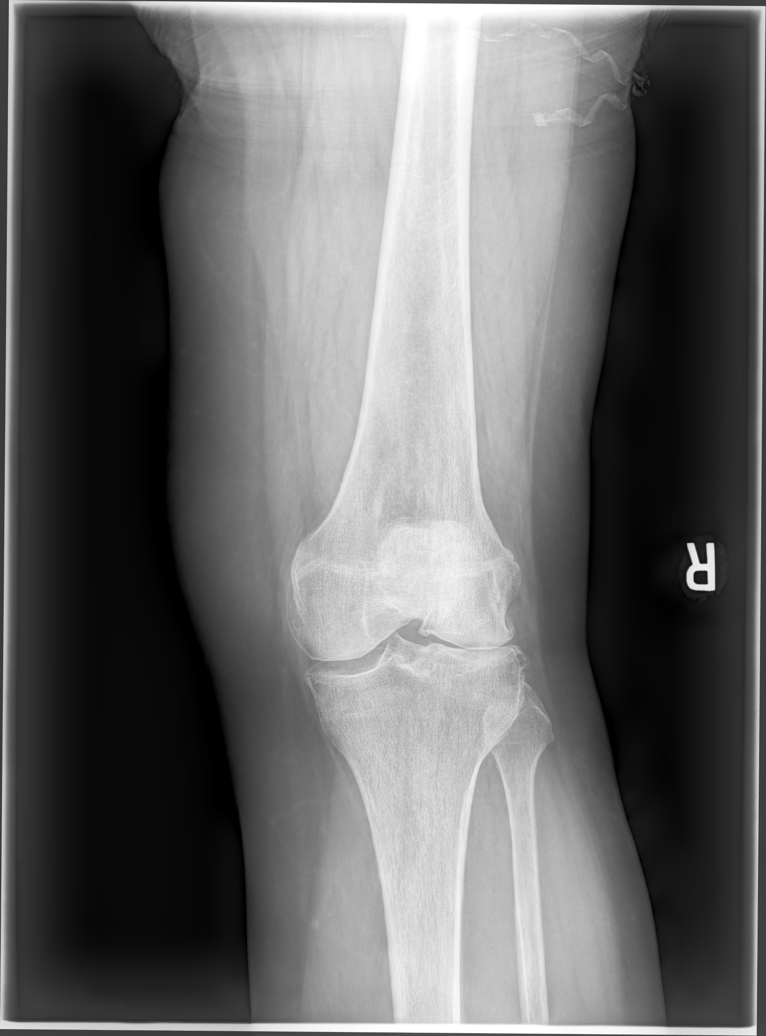

[knee lat]
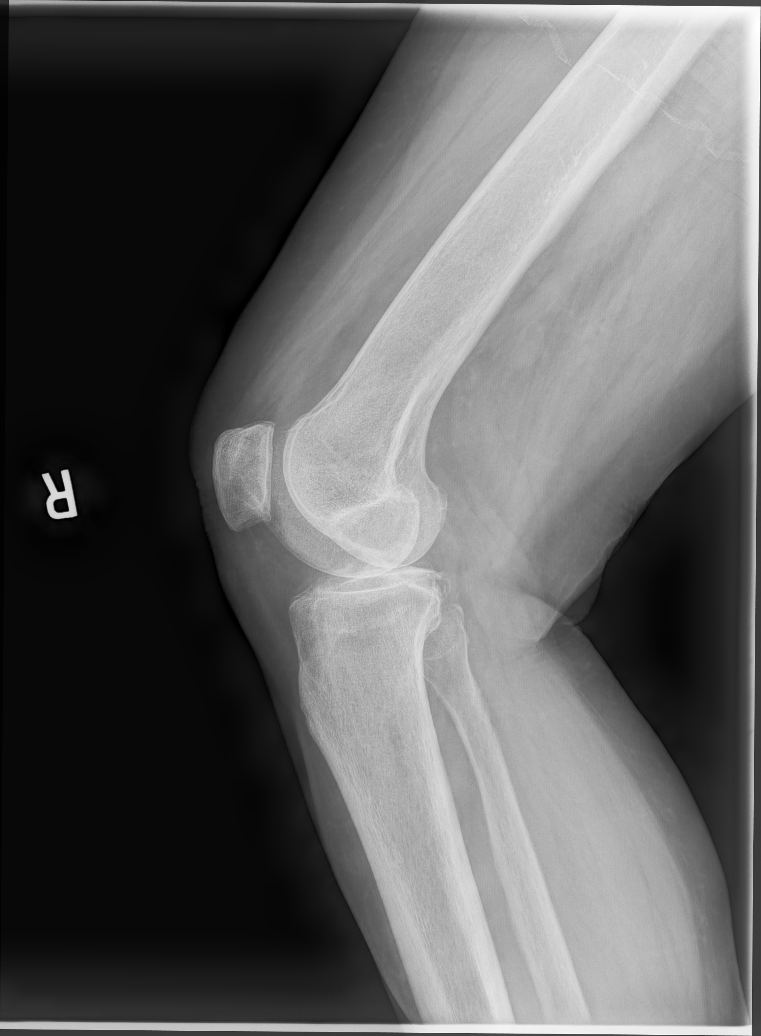

[patella (sunrise)]
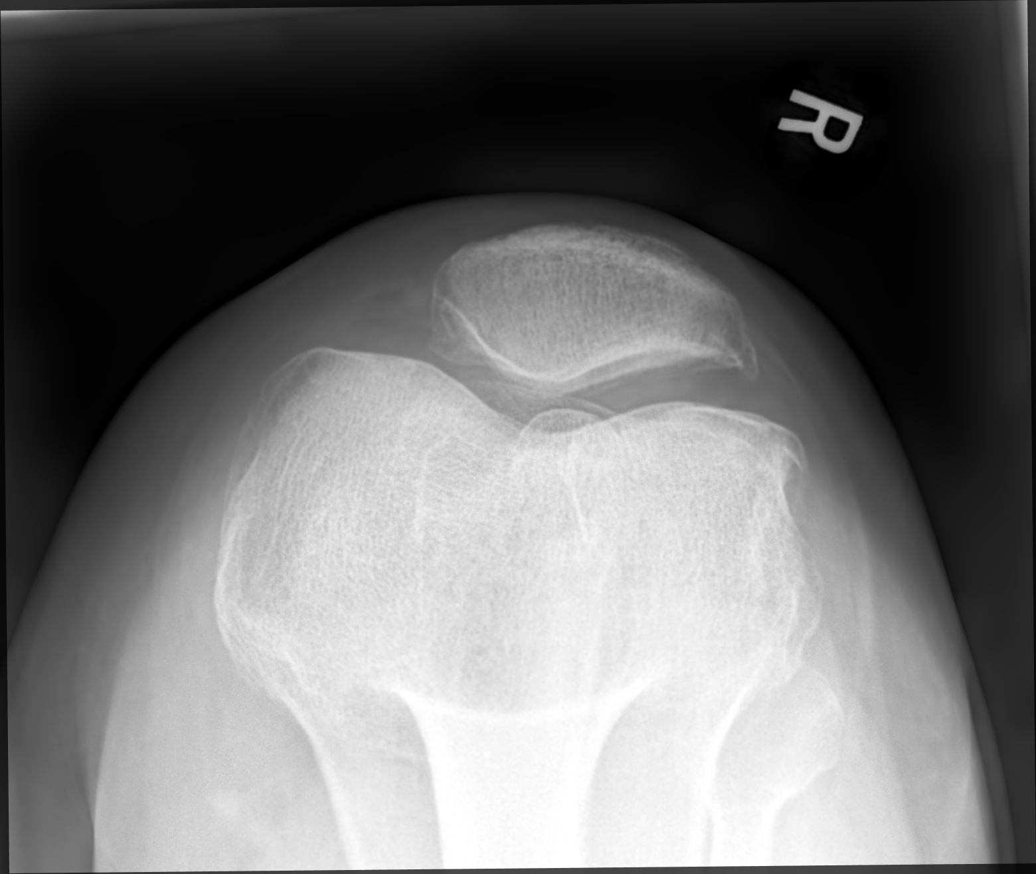

[knee ap (2 of 2)]
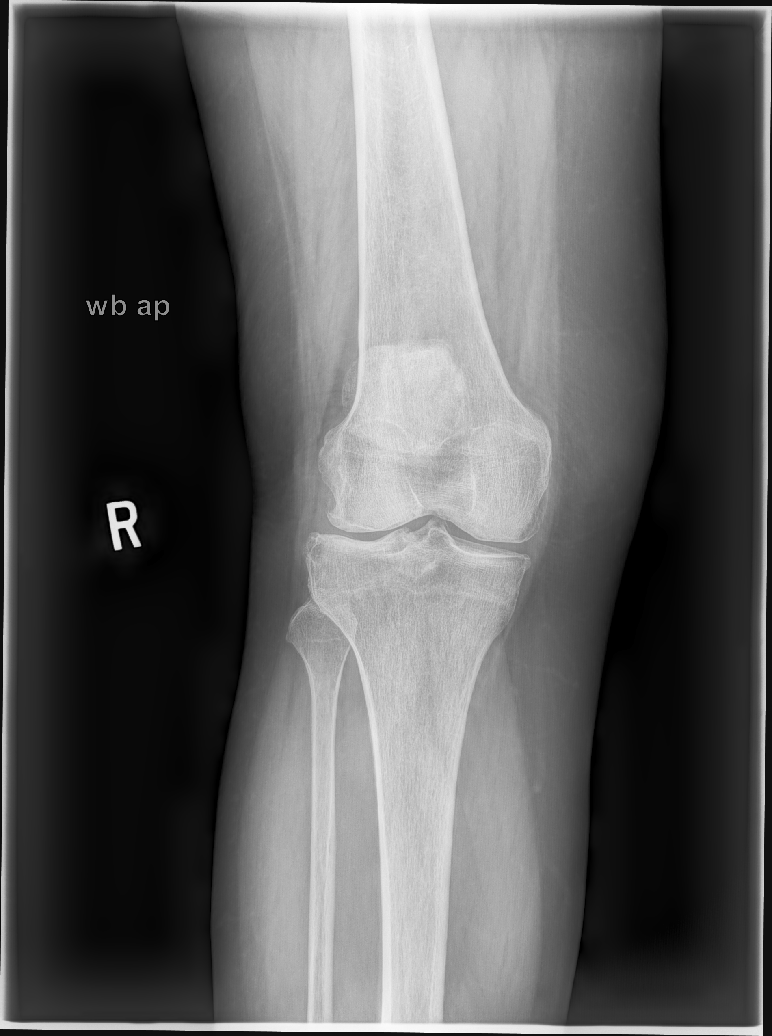

[5 of 5 positions shown; findings below may reference images not displayed]

FINDINGS: LEFT knee:

Mild osseous demineralization.

Scattered joint space narrowing and spur formation most severe at
lateral compartment.

No acute fracture, dislocation, or bone destruction.

No definite joint effusion.

Scattered atherosclerotic calcifications.

RIGHT knee:

Diffuse joint space narrowing.

Mild osseous demineralization.

Small lateral compartment spurs.

No acute fracture, dislocation, bone destruction or joint effusion.
IMPRESSION: Degenerative changes of both knees.

No acute abnormalities.

## 2017-11-22 IMAGING — DX DG KNEE COMPLETE 4+V*L*
4 series · 4 of 4 positions shown · non-contrast
Comparison: MR LEFT knee [DATE]

CLINICAL DATA: Arthritis in both knees, chronic knee pain

EXAM:
LEFT KNEE - COMPLETE 4+ VIEW; RIGHT KNEE - COMPLETE 4+ VIEW

[knee ap]
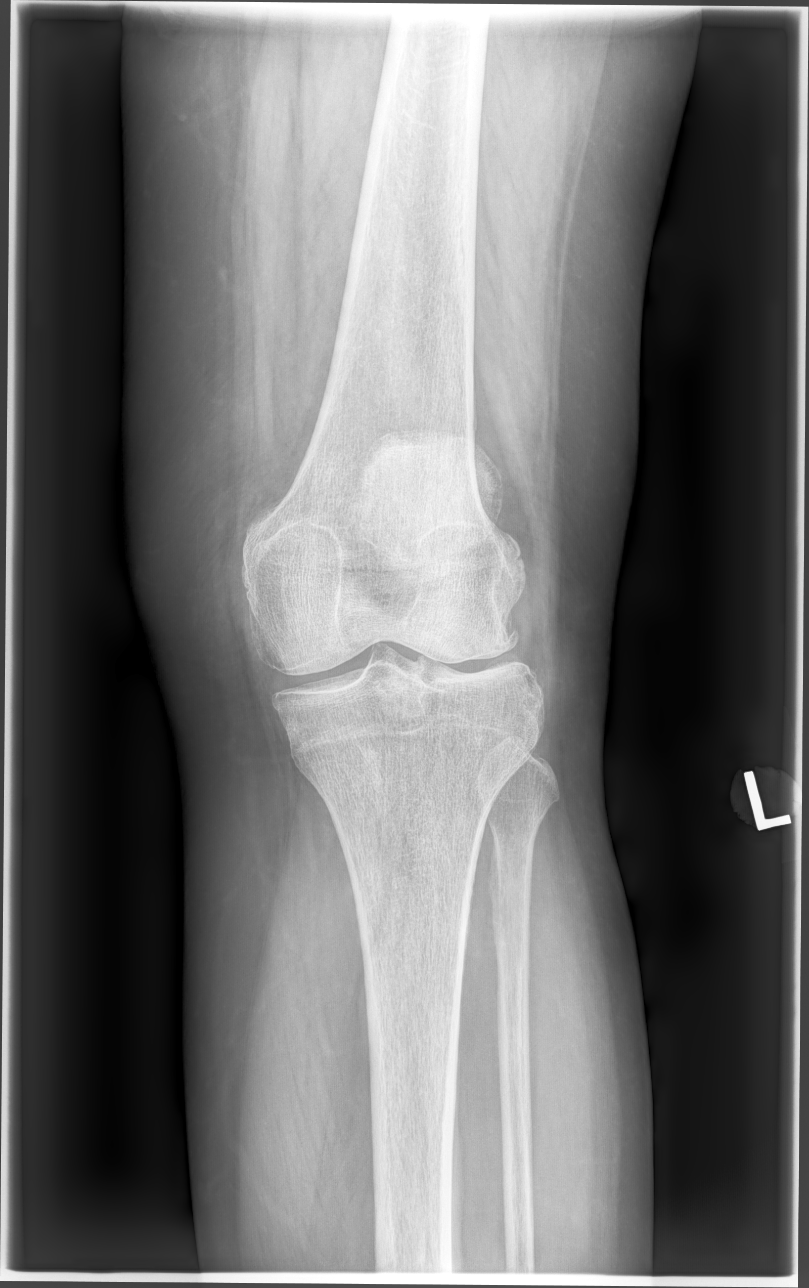

[knee [person_name] view pa]
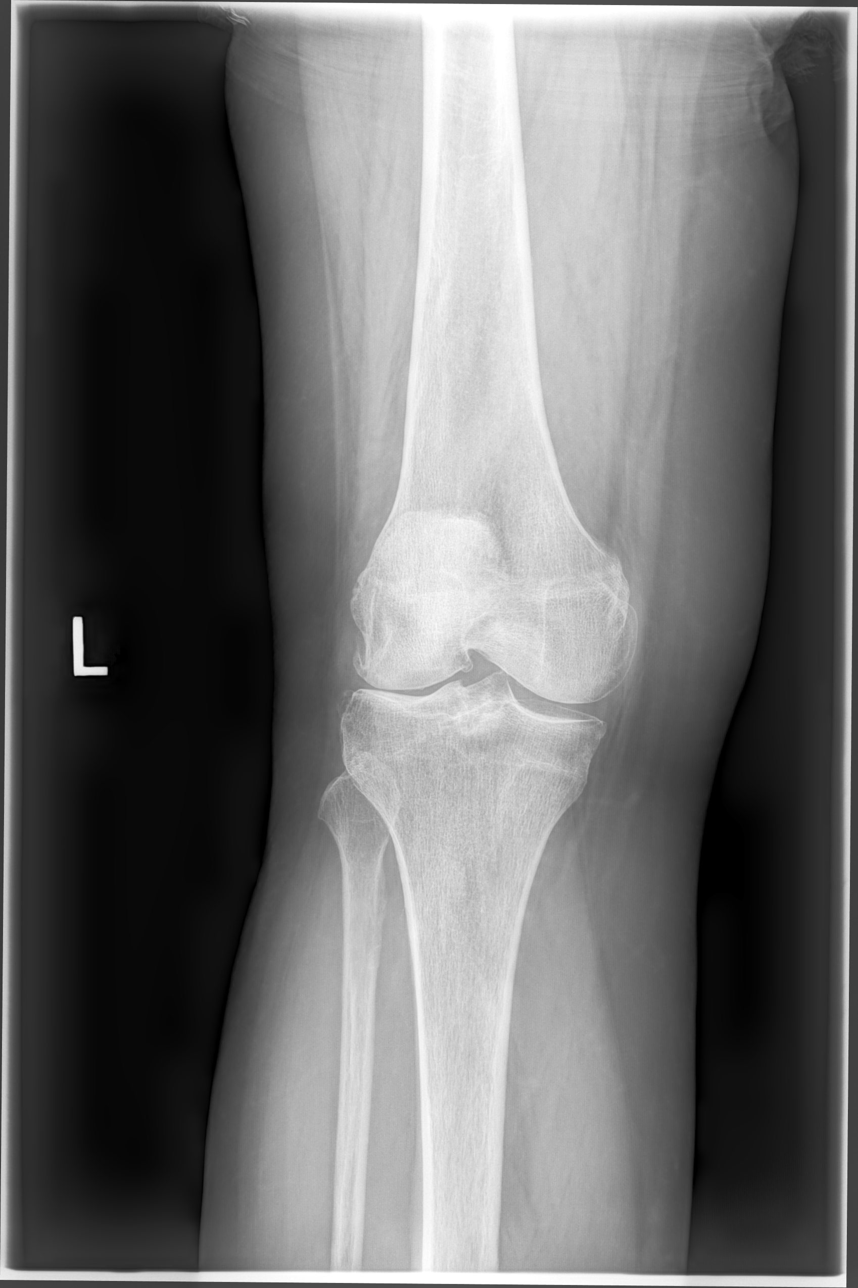

[knee lat]
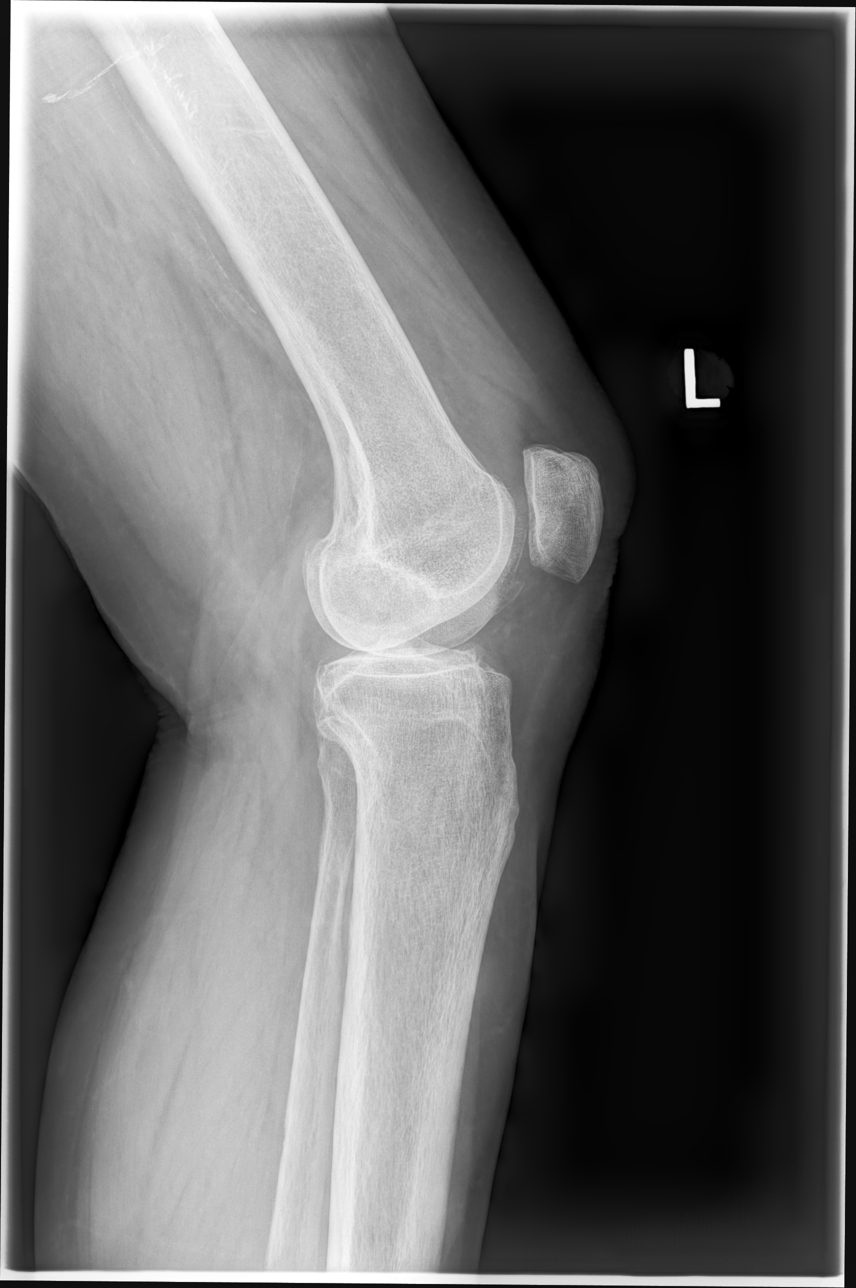

[patella (sunrise)]
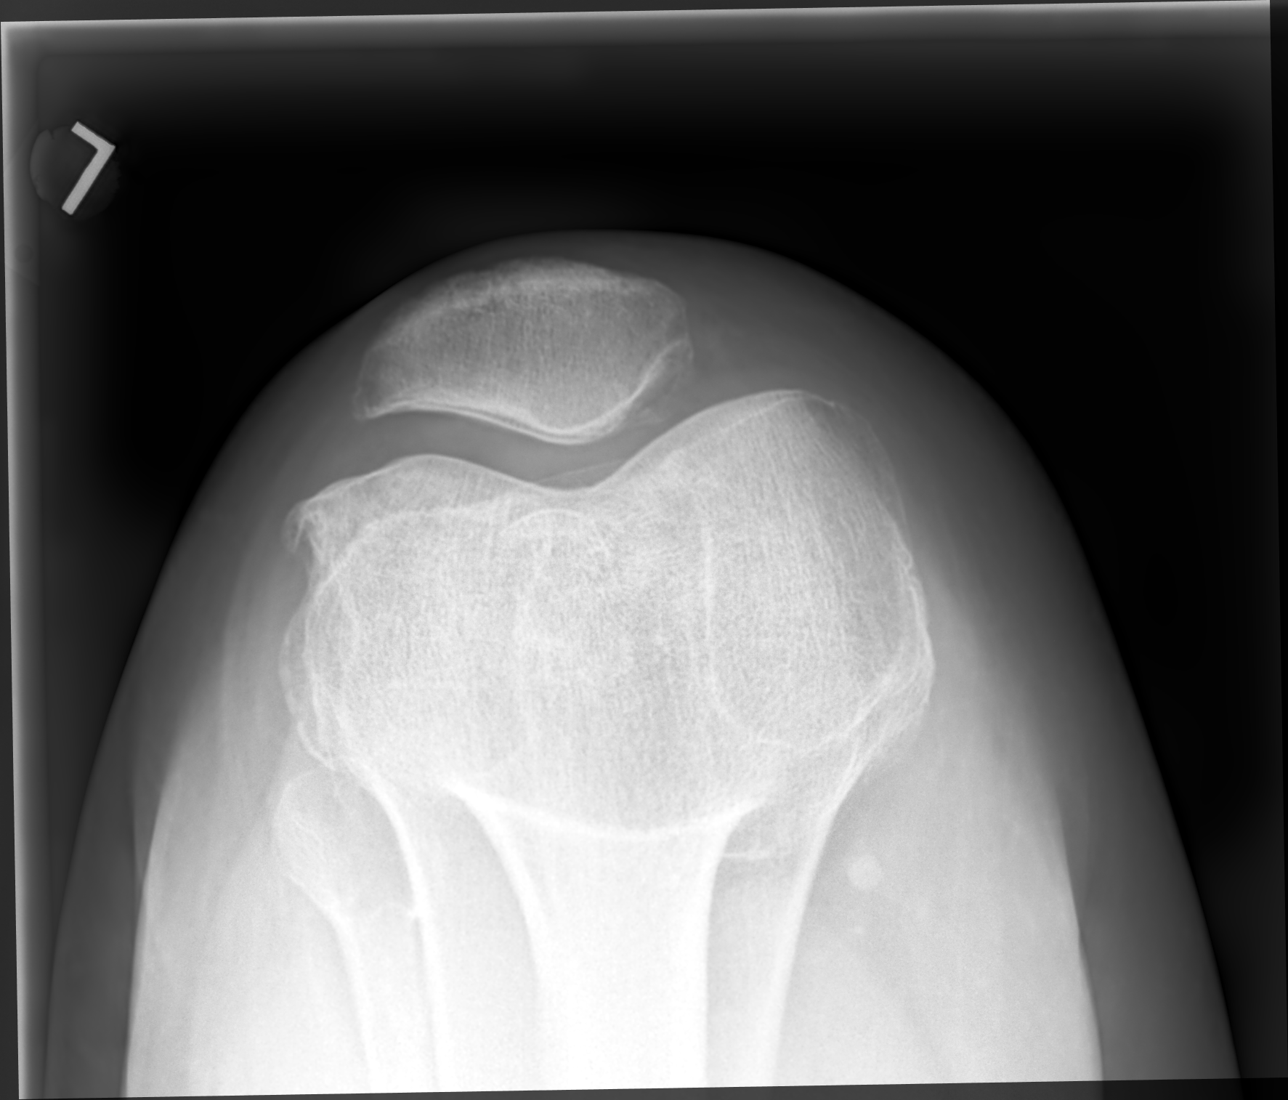

[4 of 4 positions shown; findings below may reference images not displayed]

FINDINGS: LEFT knee:

Mild osseous demineralization.

Scattered joint space narrowing and spur formation most severe at
lateral compartment.

No acute fracture, dislocation, or bone destruction.

No definite joint effusion.

Scattered atherosclerotic calcifications.

RIGHT knee:

Diffuse joint space narrowing.

Mild osseous demineralization.

Small lateral compartment spurs.

No acute fracture, dislocation, bone destruction or joint effusion.
IMPRESSION: Degenerative changes of both knees.

No acute abnormalities.

## 2017-11-22 NOTE — Patient Instructions (Signed)

## 2017-11-22 NOTE — Progress Notes (Addendum)
Subjective:  Patient ID: Ann Newton, female    DOB: Jun 08, 1955  Age: 62 y.o. MRN: [id number]  CC: Establish Care   HPI [name] presents for establishment of care.  I am also seeing her husband as well.  She has a significant past medical history of pustular psoriasis involving her hands and feet.  She has been having pain and stiffness in both of her knees and hips.  She has had surgery on both of her knees.  Strong family history of rheumatoid arthritis on the female side of her family.  She has a strong family history of coronary artery disease on her father's side.  Her mom passed from coronary artery disease in her late 45s.  Patient has no history of hypertension and quit smoking years ago.  She does have a history of an elevated LDL cholesterol.  Cholesterol is never been treated she has no vascular disease that she is aware of.  She has been having some tingling in her hands and her legs and feet.  History of B12 and vitamin D deficiencies in the past.  Her female care is through her GYN.  History [name] has a past medical history of Arthritis, Hyperlipidemia, Psoriasis, Thyroid disease, and Wears contact lenses.   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   Her family history includes Arthritis in her brother; Cancer in her paternal grandmother; Diabetes in her mother; Heart attack in her father, maternal grandfather, maternal grandmother, and paternal grandfather; Kidney disease in her mother.She reports that she quit smoking about 5 years ago. Her smoking use included cigarettes. She has a 5.00 pack-year smoking history. She has never used smokeless tobacco. She reports that she does not drink alcohol or use  drugs.  Outpatient Medications Prior to Visit  Medication Sig Dispense Refill  . Apremilast (OTEZLA) 30 MG TABS Take 30 mg by mouth 2 (two) times daily.     . Multiple Vitamins-Minerals (CENTRUM SILVER 50+WOMEN) TABS Take 1 tablet by mouth daily.    Marland Kitchen acyclovir (ZOVIRAX) 400 MG tablet Take 400 mg by mouth daily as needed (outbreaks).     . Biotin 1000 MCG tablet Take 1,000 mcg by mouth daily.    . calcipotriene (DOVONOX) 0.005 % cream Apply 1 application topically daily as needed (psoriasis).     . Cholecalciferol (VITAMIN D3) 1000 units CAPS Take 1,000 Units by mouth daily.    . clobetasol cream (TEMOVATE) 0.05 % Apply 1 application topically daily as needed (psoriasis).     Marland Kitchen estradiol (ESTRACE) 1 MG tablet Take 1 mg by mouth daily.     . hydrochlorothiazide (HYDRODIURIL) 25 MG tablet Take 25 mg by mouth daily as needed (fluid).     Marland Kitchen oxyCODONE-acetaminophen (ROXICET) 5-325 MG tablet Take 1-2 tablets by mouth every 4 (four) hours as needed for severe pain. 30 tablet 0  . phentermine (ADIPEX-P) 37.5 MG tablet Take 37.5 mg by mouth daily.   2  . vitamin B-12 (CYANOCOBALAMIN) 1000 MCG tablet Take 1,000 mcg by mouth every other day.      No facility-administered medications prior to visit.     ROS Review of Systems  Constitutional: Negative.   HENT: Negative.   Eyes: Negative.   Respiratory: Negative.   Cardiovascular: Negative.   Gastrointestinal: Negative.   Endocrine: Negative for  polyphagia and polyuria.  Genitourinary: Negative.   Musculoskeletal: Positive for arthralgias.  Skin: Positive for color change and rash.  Neurological: Positive for numbness. Negative for weakness and light-headedness.  Hematological: Does not bruise/bleed easily.  Psychiatric/Behavioral: Negative.     Objective:  BP 128/70 (BP Location: Left Arm, Patient Position: Sitting, Cuff Size: Normal)   Pulse 92   Ht 5\' 7"  (1.702 m)   Wt 184 lb 6 oz (83.6 kg)   SpO2 98%   BMI 28.88 kg/m   Physical  Exam  Constitutional: She is oriented to person, place, and time. She appears well-developed and well-nourished. No distress.  HENT:  Head: Normocephalic and atraumatic.  Right Ear: External ear normal.  Left Ear: External ear normal.  Mouth/Throat: Oropharynx is clear and moist. No oropharyngeal exudate.  Eyes: Pupils are equal, round, and reactive to light. Conjunctivae and EOM are normal. Right eye exhibits no discharge. Left eye exhibits no discharge. No scleral icterus.  Neck: Neck supple. No JVD present. No tracheal deviation present. No thyromegaly present.  Cardiovascular: Normal rate, regular rhythm and normal heart sounds.  Pulses:      Dorsalis pedis pulses are 2+ on the right side, and 2+ on the left side.       Posterior tibial pulses are 1+ on the right side, and 2+ on the left side.  Pulmonary/Chest: Effort normal and breath sounds normal.  Abdominal: Bowel sounds are normal.  Musculoskeletal: She exhibits no edema.  Lymphadenopathy:    She has no cervical adenopathy.  Neurological: She is alert and oriented to person, place, and time.  Skin: Skin is warm and dry. Rash noted. She is not diaphoretic.  Psychiatric: She has a normal mood and affect. Her behavior is normal.      Assessment & Plan:   Rejeana was seen today for establish care.  Diagnoses and all orders for this visit:  Need for influenza vaccination -     Flu Vaccine QUAD 36+ mos IM  Encounter for health maintenance examination with abnormal findings -     Urinalysis, Routine w reflex microscopic  Pustular psoriasis of palms and soles -     Ambulatory referral to Rheumatology  h/o Hypertriglyceridemia -     Comprehensive metabolic panel -     Lipid panel -     TSH  Mixed hyperlipidemia -     CBC -     Comprehensive metabolic panel -     Lipid panel -     TSH  Dietary B12 deficiency -     Vitamin B12  Vitamin D deficiency -     VITAMIN D 25 Hydroxy (Vit-D Deficiency,  Fractures)  Neuropathy -     Vitamin B12 -     TSH  Chronic pain of both knees -     Rheumatoid Factor -     DG Knee Complete 4 Views Left; Future -     DG Knee Complete 4 Views Right; Future -     Ambulatory referral to Rheumatology  Need for pneumococcal vaccination -     Pneumococcal polysaccharide vaccine 23-valent greater than or equal to 2yo subcutaneous/IM  Screen for colon cancer -     Ambulatory referral to Gastroenterology  Elevated LDL cholesterol level -     rosuvastatin (CRESTOR) 20 MG tablet; Take 1 tablet (20 mg total) by mouth daily.  Other orders -     LDL cholesterol, direct   I have discontinued Lasonya C. Currin's estradiol, acyclovir, vitamin  B-12, hydrochlorothiazide, calcipotriene, clobetasol cream, phentermine, Vitamin D3, Biotin, and oxyCODONE-acetaminophen. I am also having her start on rosuvastatin. Additionally, I am having her maintain her Apremilast and CENTRUM SILVER 50+WOMEN.  Meds ordered this encounter  Medications  . rosuvastatin (CRESTOR) 20 MG tablet    Sig: Take 1 tablet (20 mg total) by mouth daily.    Dispense:  90 tablet    Refill:  1   11/7 addendum: Discussed results of patient's blood work and x-rays with her today.  Strongly recommended treatment of her elevated LDL cholesterol.  She accepted my recommendation we will start Crestor 20 mg daily.  She will report any unusual muscle aches and pains.  She will follow-up with her orthopedic doctor for discussion on how to proceed with her knee pain.  She is taking a multivitamin with vitamin D and it and I said that she could we continue that for her vitamin D deficiency.  Follow-up in 3 months.  Follow-up: Return in about 6 months (around 05/23/2018).  Mliss Sax, MD

## 2017-11-23 ENCOUNTER — Encounter: Payer: Self-pay | Admitting: Family Medicine

## 2017-11-23 LAB — RHEUMATOID FACTOR: Rhuematoid fact SerPl-aCnc: <14 IU/mL (ref <14)

## 2017-11-23 MED ORDER — ROSUVASTATIN CALCIUM 20 MG PO TABS: 20.0000 mg | ORAL_TABLET | Freq: Every day | ORAL | 1 refills | Status: DC

## 2017-11-23 NOTE — Addendum Note (Signed)
Addended by: Andrez Grime on: 11/23/2017 03:29 PM   Modules accepted: Orders, Level of Service

## 2018-02-01 NOTE — Progress Notes (Signed)
Office Visit Note  Patient: Ann Newton             Date of Birth: 06/19/1955           MRN: [id number]             PCP: Mliss SaxKremer, William Alfred, MD Referring: Mliss SaxKremer, William Alfred,* Visit Date: 02/14/2018 Occupation: Data management for [employer]  Subjective:  Pain in both knees.   History of Present Illness: Ann Glasgowerri C Choquette is a 63 y.o. female consultation per request of Dr. Doreene BurkeKremer.  According to patient her symptoms of psoriasis started about 5 years ago for which she was seen by dermatologist.  She states she used topical agents for many years.  About 3 years ago she started taking Maur[place]iatezla.  She states Otezla help with the pustular psoriasis.  The psoriasis was limited to her palms and the plantar aspect of her feet.  She also has history of shoulder and knee joint pain for many years.  She states she has had a right shoulder joint arthroscopic surgery in the past and bilateral knee joint arthroscopic surgery.  The last 3 weeks she has been experiencing increased pain in her bilateral knee joints she is also noticed swelling in her knees.  She states the swelling in her left knee is much more prominent than the right knee.  She has some discomfort in the trochanteric area at nighttime.  She is also noticed a stiffness in her hands especially in the morning.  None of the other joints are painful.  He has some lower back pain.  Activities of Daily Living:  Patient reports morning stiffness for several hours.   Patient Reports nocturnal pain.  Difficulty dressing/grooming: Denies Difficulty climbing stairs: Denies Difficulty getting out of chair: Reports Difficulty using hands for taps, buttons, cutlery, and/or writing: Denies  Review of Systems  Constitutional: Positive for fatigue. Negative for night sweats, weight gain and weight loss.  HENT: Negative for mouth sores, trouble swallowing, trouble swallowing, mouth dryness and nose dryness.   Eyes: Negative for pain, redness, itching,  visual disturbance and dryness.  Respiratory: Negative for cough, hemoptysis, shortness of breath and difficulty breathing.   Cardiovascular: Negative for chest pain, palpitations, hypertension, irregular heartbeat and swelling in legs/feet.  Gastrointestinal: Negative for abdominal pain, blood in stool, constipation and diarrhea.  Endocrine: Negative for increased urination.  Genitourinary: Negative for painful urination, pelvic pain and vaginal dryness.  Musculoskeletal: Positive for arthralgias, joint pain, joint swelling and morning stiffness. Negative for myalgias, muscle weakness, muscle tenderness and myalgias.  Skin: Positive for rash. Negative for color change, hair loss, redness, skin tightness, ulcers and sensitivity to sunlight.  Allergic/Immunologic: Negative for susceptible to infections.  Neurological: Positive for parasthesias. Negative for dizziness, light-headedness, numbness, headaches, memory loss, night sweats and weakness.  Hematological: Negative for bruising/bleeding tendency and swollen glands.  Psychiatric/Behavioral: Negative for depressed mood, confusion and sleep disturbance. The patient is not nervous/anxious.     PMFS History:  Patient Active Problem List   Diagnosis Date Noted  . Neuropathy 11/22/2017  . Chronic pain of both knees 11/22/2017  . Need for pneumococcal vaccination 11/22/2017  . Vitamin D deficiency 01/24/2016  . Smoking hx- ( less 1 ppd * 15 yrs) - Quit Jan 17 2013 12/28/2015  . Elevated LDL cholesterol level 12/28/2015  . Elevated vitamin B12 level 12/28/2015  . h/o Mild peripheral edema- takes HCTZ for this 12/20/2015  . Dietary B12 deficiency 12/20/2015  . History of chickenpox (  Aug '17 )  12/20/2015  . Screen for colon cancer 12/20/2015  . h/o being Overweight 12/03/2015  . h/o Hypertriglyceridemia 12/03/2015  . h/o Hyperlipidemia 12/03/2015  . Pustular psoriasis of palms and soles 10/28/2010    Past Medical History:  Diagnosis Date    . Arthritis   . Hyperlipidemia    diet controlled  . Psoriasis   . Thyroid disease   . Wears contact lenses     Family History  Problem Relation Age of Onset  . Kidney disease Mother   . Diabetes Mother   . Heart attack Father   . Rheum arthritis Brother   . Healthy Daughter   . Healthy Son   . Heart attack Maternal Grandmother   . Heart attack Maternal Grandfather   . Cancer Paternal Grandmother   . Heart attack Paternal Grandfather   . Rheum arthritis Brother    Past Surgical History:  Procedure Laterality Date  . ABDOMINAL HYSTERECTOMY  2000   part  . ANTERIOR AND POSTERIOR REPAIR N/A 09/08/2016   Procedure: ANTERIOR (CYSTOCELE) AND POSTERIOR REPAIR (RECTOCELE)/Perineorrhaphy;  Surgeon: Olivia Mackie, MD;  Location: [facility];  Service: Gynecology;  Laterality: N/A;  Requests . Repair of enterocele/posterior  . BREAST SURGERY  2001   bil implants  . COLONOSCOPY    . COSMETIC SURGERY  1999   breast implants  . HAGLAND'S DEFORMITY EXCISION Left 09/13/2013   Procedure: LEFT FOOT: EXCISION INTERDIGITAL MORTON'S NEUROMA SINGLE EACH;  Surgeon: Thera Flake., MD;  Location: [facility];  Service: Orthopedics;  Laterality: Left;  . KNEE ARTHROSCOPY  2013,2011   right/left  . SHOULDER ARTHROSCOPY     rt  . TUBAL LIGATION     Social History   Social History Narrative  . Not on file   Immunization History  Administered Date(s) Administered  . Influenza,inj,Quad PF,6+ Mos 12/03/2015, 11/22/2017  . Pneumococcal Polysaccharide-23 11/22/2017  . Tdap 02/09/2009     Objective: Vital Signs: BP 117/72 (BP Location: Right Arm, Patient Position: Sitting, Cuff Size: Normal)   Pulse 75   Resp 14   Ht 5' 7.5" (1.715 m)   Wt 183 lb (83 kg)   BMI 28.24 kg/m    Physical Exam Vitals signs and nursing note reviewed.  Constitutional:      Appearance: She is well-developed.  HENT:     Head: Normocephalic and atraumatic.  Eyes:     Conjunctiva/sclera:  Conjunctivae normal.  Neck:     Musculoskeletal: Normal range of motion.  Cardiovascular:     Rate and Rhythm: Normal rate and regular rhythm.     Heart sounds: Normal heart sounds.  Pulmonary:     Effort: Pulmonary effort is normal.     Breath sounds: Normal breath sounds.  Abdominal:     General: Bowel sounds are normal.     Palpations: Abdomen is soft.  Lymphadenopathy:     Cervical: No cervical adenopathy.  Skin:    General: Skin is warm and dry.     Capillary Refill: Capillary refill takes less than 2 seconds.  Neurological:     Mental Status: She is alert and oriented to person, place, and time.  Psychiatric:        Behavior: Behavior normal.      Musculoskeletal Exam: C-spine thoracic lumbar spine good range of motion.  She has tenderness on palpation over right SI joint.  Shoulder joints elbow joints wrist joints with good range of motion.  She has PIP and DIP  thickening with no synovitis.  She has some tenderness over PIP joints.  She has tenderness over bilateral trochanteric bursa.  She has discomfort range of motion of bilateral knee joints and right hip joint.  Range of motion of right hip joint was limited.  She had warmth swelling and effusion in her left knee joint.  Ankle joints MTPs PIPs been good range of motion with no synovitis.  There was no evidence of Achilles tendinitis or plantar fasciitis.  CDAI Exam: CDAI Score: Not documented Patient Global Assessment: Not documented; Provider Global Assessment: Not documented Swollen: Not documented; Tender: Not documented Joint Exam   Not documented   There is currently no information documented on the homunculus. Go to the Rheumatology activity and complete the homunculus joint exam.  Investigation: No additional findings.  Imaging: No results found.  Recent Labs: Lab Results  Component Value Date   WBC 6.4 11/22/2017   HGB 13.5 11/22/2017   PLT 436.0 (H) 11/22/2017   NA 141 11/22/2017   K 4.9  11/22/2017   CL 103 11/22/2017   CO2 28 11/22/2017   GLUCOSE 101 (H) 11/22/2017   BUN 10 11/22/2017   CREATININE 0.79 11/22/2017   BILITOT 0.3 11/22/2017   ALKPHOS 92 11/22/2017   AST 21 11/22/2017   ALT 28 11/22/2017   PROT 7.5 11/22/2017   ALBUMIN 4.4 11/22/2017   CALCIUM 10.0 11/22/2017   GFRAA >60 09/02/2016    Speciality Comments: No specialty comments available.  Procedures:  Large Joint Inj: L knee on 02/14/2018 10:26 AM Indications: pain Details: 27 G 1.5 in needle, medial approach  Arthrogram: No  Medications: 3 mL lidocaine 1 %; 60 mg triamcinolone acetonide 40 MG/ML Aspirate: 15 mL Outcome: tolerated well, no immediate complications Procedure, treatment alternatives, risks and benefits explained, specific risks discussed. Consent was given by the patient. Immediately prior to procedure a time out was called to verify the correct patient, procedure, equipment, support staff and site/side marked as required. Patient was prepped and draped in the usual sterile fashion.     Allergies: Patient has no known allergies.   Assessment / Plan:     Visit Diagnoses: Pustular psoriasis of palms and soles-patient has been treated with Henderson [place]tezla for 3 years now.  Her psoriasis is much better.  She has mild rash on her palms.  Pain in both hands -she has DIP and PIP thickening without synovitis.  She complains of intermittent swelling and stiffness in her hands.  Plan: XR Hand 2 View Right, XR Hand 2 View Left.  X-ray of bilateral hands were consistent with osteoarthritis.  Effusion, left knee -she had moderate effusion in her left knee joint on examination.  According to patient the symptoms have started in the last 3 weeks.  For therapeutic and diagnostic purposes of the left knee joint was prepped in sterile fashion and synovial fluid was aspirated which was sent for cell count, crystals and culture.  The knee joint was also injected with cortisone as described above.  She tolerated  the procedure well.  Plan: Uric acid, Sedimentation rate, Rheumatoid factor, Cyclic citrul peptide antibody, IgG, ANA, 14-3-3 eta Protein, Angiotensin converting enzyme   Chronic pain of both knees -she has history of pain in bilateral knee joints for many years.  She has had arthroscopic surgery to bilateral knee joints in the past.  11/22/17: RF <14 - Plan: XR KNEE 3 VIEW LEFT, XR KNEE 3 VIEW RIGHT.  The x-ray showed bilateral moderate lateral compartment narrowing and mild  chondromalacia patella.  Pain in right hip -she has limited painful range of motion of her right hip joint.  Plan: XR HIP UNILAT W OR W/O PELVIS 2-3 VIEWS RIGHT.  The x-ray of the hip joint was unremarkable except for inferior spurring.  Chronic right SI joint pain -she had tenderness on palpation of the right SI joint.  Plan: XR Pelvis 1-2 Views.  X-rays did not show any SI joint to sclerosis or narrowing.  Other fatigue - Plan: Urinalysis, Routine w reflex microscopic, CK, TSH, Hepatitis B core antibody, IgM, Hepatitis B surface antigen, Hepatitis C antibody  History of neuropathy  Vitamin D deficiency  History of hyperlipidemia  Smoking hx- ( less 1 ppd * 15 yrs) - Quit Jan 17 2013  Family history of rheumatoid arthritis - In 2 brothers   Orders: Orders Placed This Encounter  Procedures  . XR Pelvis 1-2 Views  . XR HIP UNILAT W OR W/O PELVIS 2-3 VIEWS RIGHT  . XR KNEE 3 VIEW LEFT  . XR KNEE 3 VIEW RIGHT  . XR Hand 2 View Right  . XR Hand 2 View Left  . Urinalysis, Routine w reflex microscopic  . CK  . TSH  . Uric acid  . Sedimentation rate  . Rheumatoid factor  . Cyclic citrul peptide antibody, IgG  . ANA  . 14-3-3 eta Protein  . Angiotensin converting enzyme  . Hepatitis B core antibody, IgM  . Hepatitis B surface antigen  . Hepatitis C antibody   No orders of the defined types were placed in this encounter.   Face-to-face time spent with patient was 60 minutes. Greater than 50% of time was spent  in counseling and coordination of care.  Follow-Up Instructions: Return for Pustular psoriasis, inflammatory arthritis.   Pollyann Savoy, MD  Note - This record has been created using Animal nutritionist.  Chart creation errors have been sought, but may not always  have been located. Such creation errors do not reflect on  the standard of medical care.

## 2018-02-14 ENCOUNTER — Ambulatory Visit (INDEPENDENT_AMBULATORY_CARE_PROVIDER_SITE_OTHER): Payer: Self-pay

## 2018-02-14 ENCOUNTER — Ambulatory Visit (INDEPENDENT_AMBULATORY_CARE_PROVIDER_SITE_OTHER): Payer: 59 | Admitting: Rheumatology

## 2018-02-14 ENCOUNTER — Encounter: Payer: Self-pay | Admitting: Rheumatology

## 2018-02-14 VITALS — BP 117/72 | HR 75 | Resp 14 | Ht 67.5 in | Wt 183.0 lb

## 2018-02-14 DIAGNOSIS — Z8639 Personal history of other endocrine, nutritional and metabolic disease: Secondary | ICD-10-CM

## 2018-02-14 DIAGNOSIS — M79641 Pain in right hand: Secondary | ICD-10-CM | POA: Diagnosis not present

## 2018-02-14 DIAGNOSIS — M79642 Pain in left hand: Secondary | ICD-10-CM | POA: Diagnosis not present

## 2018-02-14 DIAGNOSIS — R5383 Other fatigue: Secondary | ICD-10-CM

## 2018-02-14 DIAGNOSIS — Z8261 Family history of arthritis: Secondary | ICD-10-CM

## 2018-02-14 DIAGNOSIS — M25551 Pain in right hip: Secondary | ICD-10-CM | POA: Diagnosis not present

## 2018-02-14 DIAGNOSIS — M25562 Pain in left knee: Secondary | ICD-10-CM

## 2018-02-14 DIAGNOSIS — M533 Sacrococcygeal disorders, not elsewhere classified: Secondary | ICD-10-CM

## 2018-02-14 DIAGNOSIS — L403 Pustulosis palmaris et plantaris: Secondary | ICD-10-CM

## 2018-02-14 DIAGNOSIS — M25561 Pain in right knee: Secondary | ICD-10-CM

## 2018-02-14 DIAGNOSIS — G8929 Other chronic pain: Secondary | ICD-10-CM

## 2018-02-14 DIAGNOSIS — Z8669 Personal history of other diseases of the nervous system and sense organs: Secondary | ICD-10-CM

## 2018-02-14 DIAGNOSIS — M25462 Effusion, left knee: Secondary | ICD-10-CM | POA: Diagnosis not present

## 2018-02-14 DIAGNOSIS — E559 Vitamin D deficiency, unspecified: Secondary | ICD-10-CM

## 2018-02-14 DIAGNOSIS — Z87891 Personal history of nicotine dependence: Secondary | ICD-10-CM

## 2018-02-14 MED ORDER — TRIAMCINOLONE ACETONIDE 40 MG/ML IJ SUSP
60.0000 mg | INTRAMUSCULAR | Status: AC | PRN
  Administered 2018-02-14: 60 mg via INTRA_ARTICULAR

## 2018-02-14 MED ORDER — LIDOCAINE HCL 1 % IJ SOLN
3.0000 mL | INTRAMUSCULAR | Status: AC | PRN
  Administered 2018-02-14: 3 mL

## 2018-02-19 LAB — ANGIOTENSIN CONVERTING ENZYME: Angiotensin-Converting Enzyme: 42 U/L (ref 9–67)

## 2018-02-19 LAB — URINALYSIS, ROUTINE W REFLEX MICROSCOPIC
Bilirubin Urine: NEGATIVE
Glucose, UA: NEGATIVE
Hgb urine dipstick: NEGATIVE
Ketones, ur: NEGATIVE
Leukocytes, UA: NEGATIVE
Nitrite: NEGATIVE
Protein, ur: NEGATIVE
Specific Gravity, Urine: 1.015 (ref 1.001–1.03)
pH: 6 (ref 5.0–8.0)

## 2018-02-19 LAB — ANA: ANA: NEGATIVE

## 2018-02-19 LAB — URIC ACID: Uric Acid, Serum: 5 mg/dL (ref 2.5–7.0)

## 2018-02-19 LAB — HEPATITIS B CORE ANTIBODY, IGM: Hep B C IgM: NONREACTIVE

## 2018-02-19 LAB — HEPATITIS B SURFACE ANTIGEN: Hepatitis B Surface Ag: NONREACTIVE

## 2018-02-19 LAB — 14-3-3 ETA PROTEIN: 14-3-3 eta Protein: <0.2 ng/mL (ref <0.2)

## 2018-02-19 LAB — CK: Total CK: 24 U/L — ABNORMAL LOW (ref 29–143)

## 2018-02-19 LAB — TSH: TSH: 0.75 mIU/L (ref 0.40–4.50)

## 2018-02-19 LAB — HEPATITIS C ANTIBODY
Hepatitis C Ab: NONREACTIVE
SIGNAL TO CUT-OFF: 0.02 (ref <1.00)

## 2018-02-19 LAB — RHEUMATOID FACTOR: Rheumatoid fact SerPl-aCnc: <14 IU/mL (ref <14)

## 2018-02-19 LAB — SEDIMENTATION RATE: SED RATE: 29 mm/h (ref 0–30)

## 2018-02-19 LAB — CYCLIC CITRUL PEPTIDE ANTIBODY, IGG: Cyclic Citrullin Peptide Ab: <16 UNITS

## 2018-02-20 LAB — SYNOVIAL CELL COUNT + DIFF, W/ CRYSTALS
Basophils, %: 0 %
Eosinophils-Synovial: 0 % (ref 0–2)
Lymphocytes-Synovial Fld: 9 % (ref 0–74)
Monocyte/Macrophage: 85 % — ABNORMAL HIGH (ref 0–69)
Neutrophil, Synovial: 3 % (ref 0–24)
Synoviocytes, %: 3 % (ref 0–15)
WBC, Synovial: 896 cells/uL — ABNORMAL HIGH (ref <150)

## 2018-02-20 LAB — ANAEROBIC AND AEROBIC CULTURE
AER RESULT: NO GROWTH
GRAM STAIN:: NONE SEEN
MICRO NUMBER:: 124287
MICRO NUMBER:: 124364
SPECIMEN QUALITY:: ADEQUATE
SPECIMEN QUALITY:: ADEQUATE

## 2018-02-22 DIAGNOSIS — M170 Bilateral primary osteoarthritis of knee: Secondary | ICD-10-CM | POA: Insufficient documentation

## 2018-02-22 DIAGNOSIS — M19041 Primary osteoarthritis, right hand: Secondary | ICD-10-CM | POA: Insufficient documentation

## 2018-02-22 DIAGNOSIS — M19042 Primary osteoarthritis, left hand: Secondary | ICD-10-CM

## 2018-02-22 NOTE — Progress Notes (Deleted)
Office Visit Note  Patient: Ann Newton             Date of Birth: 1955/08/09           MRN: [id number]             PCP: Libby Maw, MD Referring: Libby Maw,* Visit Date: 03/08/2018 Occupation: _0 @  Subjective:  No chief complaint on file.   History of Present Illness: Ann Newton is a 63 y.o. female ***   Activities of Daily Living:  Patient reports morning stiffness for *** {minute/hour:19697}.   Patient {ACTIONS;DENIES/REPORTS:21021675::"Denies"} nocturnal pain.  Difficulty dressing/grooming: {ACTIONS;DENIES/REPORTS:21021675::"Denies"} Difficulty climbing stairs: {ACTIONS;DENIES/REPORTS:21021675::"Denies"} Difficulty getting out of chair: {ACTIONS;DENIES/REPORTS:21021675::"Denies"} Difficulty using hands for taps, buttons, cutlery, and/or writing: {ACTIONS;DENIES/REPORTS:21021675::"Denies"}  No Rheumatology ROS completed.   PMFS History:  Patient Active Problem List   Diagnosis Date Noted  . Neuropathy 11/22/2017  . Chronic pain of both knees 11/22/2017  . Need for pneumococcal vaccination 11/22/2017  . Vitamin D deficiency 01/24/2016  . Smoking hx- ( less 1 ppd * 15 yrs) - Quit Jan 17 2013 12/28/2015  . Elevated LDL cholesterol level 12/28/2015  . Elevated vitamin B12 level 12/28/2015  . h/o Mild peripheral edema- takes HCTZ for this 12/20/2015  . Dietary B12 deficiency 12/20/2015  . History of chickenpox ( Aug '17 )  12/20/2015  . Screen for colon cancer 12/20/2015  . h/o being Overweight 12/03/2015  . h/o Hypertriglyceridemia 12/03/2015  . h/o Hyperlipidemia 12/03/2015  . Pustular psoriasis of palms and soles 10/28/2010    Past Medical History:  Diagnosis Date  . Arthritis   . Hyperlipidemia    diet controlled  . Psoriasis   . Thyroid disease   . Wears contact lenses     Family History  Problem Relation Age of Onset  . Kidney disease Mother   . Diabetes Mother   . Heart attack Father   . Rheum arthritis Brother    . Healthy Daughter   . Healthy Son   . Heart attack Maternal Grandmother   . Heart attack Maternal Grandfather   . Cancer Paternal Grandmother   . Heart attack Paternal Grandfather   . Rheum arthritis Brother    Past Surgical History:  Procedure Laterality Date  . ABDOMINAL HYSTERECTOMY  2000   part  . ANTERIOR AND POSTERIOR REPAIR N/A 09/08/2016   Procedure: ANTERIOR (CYSTOCELE) AND POSTERIOR REPAIR (RECTOCELE)/Perineorrhaphy;  Surgeon: Brien Few, MD;  Location: [facility];  Service: Gynecology;  Laterality: N/A;  Requests 45mn. Repair of enterocele/posterior  . BREAST SURGERY  2001   bil implants  . COLONOSCOPY    . COSMETIC SURGERY  1999   breast implants  . HAGLAND'S DEFORMITY EXCISION Left 09/13/2013   Procedure: LEFT FOOT: EXCISION INTERDIGITAL MORTON'S NEUROMA SINGLE EACH;  Surgeon: WYvette Rack, MD;  [place]  Service: Orthopedics;  Laterality: Left;  . KNEE ARTHROSCOPY  2013,2011   right/left  . SHOULDER ARTHROSCOPY     rt  . TUBAL LIGATION     Social History   Social History Narrative  . Not on file   Immunization History  Administered Date(s) Administered  . Influenza,inj,Quad PF,6+ Mos 12/03/2015, 11/22/2017  . Pneumococcal Polysaccharide-23 11/22/2017  . Tdap 02/09/2009     Objective: Vital Signs: There were no vitals taken for this visit.   Physical Exam   Musculoskeletal Exam: ***  CDAI Exam: CDAI Score: Not documented Patient Global Assessment: Not documented; Provider Global Assessment: Not documented  Swollen: Not documented; Tender: Not documented Joint Exam   Not documented   There is currently no information documented on the homunculus. Go to the Rheumatology activity and complete the homunculus joint exam.  Investigation: No additional findings.  Imaging: Xr Hip Unilat W Or W/o Pelvis 2-3 Views Right  Result Date: 02/14/2018 No hip joint narrowing was noted. Inferior spurring was noted.  No  chondrocalcinosis was noted.  Xr Hand 2 View Left  Result Date: 02/14/2018 PIP DIP and CMC narrowing was noted.  No MCP intercarpal radiocarpal joint space narrowing was noted.  No erosive changes were noted. Impression: These findings are consistent with osteoarthritis of the hand.  Xr Hand 2 View Right  Result Date: 02/14/2018 PIP DIP and CMC narrowing was noted.  No MCP intercarpal radiocarpal joint space narrowing was noted.  No erosive changes were noted. Impression: These findings are consistent with osteoarthritis of the hand.  Xr Knee 3 View Left  Result Date: 02/14/2018 Moderate lateral compartment narrowing with lateral osteophytes was noted.  No chondrocalcinosis was noted.  Mild patellofemoral narrowing was noted. Impression: These findings are consistent with moderate osteoarthritis.  Xr Knee 3 View Right  Result Date: 02/14/2018 Moderate lateral compartment narrowing with lateral osteophytes was noted.  No chondrocalcinosis was noted.  Mild patellofemoral narrowing was noted. Impression: These findings are consistent with moderate osteoarthritis.  Xr Pelvis 1-2 Views  Result Date: 02/14/2018 No SI joint narrowing or sclerosis was noted.  Some sclerosis and pubic symphysis was noted.  Degenerative disc disease in the lumbar spine was noted. Impression: Unremarkable x-ray of the SI joints.   Recent Labs: Lab Results  Component Value Date   WBC 6.4 11/22/2017   HGB 13.5 11/22/2017   PLT 436.0 (H) 11/22/2017   NA 141 11/22/2017   K 4.9 11/22/2017   CL 103 11/22/2017   CO2 28 11/22/2017   GLUCOSE 101 (H) 11/22/2017   BUN 10 11/22/2017   CREATININE 0.79 11/22/2017   BILITOT 0.3 11/22/2017   ALKPHOS 92 11/22/2017   AST 21 11/22/2017   ALT 28 11/22/2017   PROT 7.5 11/22/2017   ALBUMIN 4.4 11/22/2017   CALCIUM 10.0 11/22/2017   GFRAA >60 09/02/2016  February 14, 2018 synovial fluid WBC 896 crystals negative, culture negative, CK 24, TSH normal, hepatitis B-, hepatitis  C negative, uric acid 5.0, ESR 29 RF negative, anti-CCP negative, ANA negative, _0 eta negative, ACE negative  Speciality Comments: No specialty comments available.  Procedures:  No procedures performed Allergies: Patient has no known allergies.   Assessment / Plan:     Visit Diagnoses: No diagnosis found.   Orders: No orders of the defined types were placed in this encounter.  No orders of the defined types were placed in this encounter.   Face-to-face time spent with patient was *** minutes. Greater than 50% of time was spent in counseling and coordination of care.  Follow-Up Instructions: No follow-ups on file.   Bo Merino, MD  Note - This record has been created using Editor, commissioning.  Chart creation errors have been sought, but may not always  have been located. Such creation errors do not reflect on  the standard of medical care.

## 2018-02-28 ENCOUNTER — Encounter: Payer: Self-pay | Admitting: Family Medicine

## 2018-03-08 ENCOUNTER — Ambulatory Visit: Payer: 59 | Admitting: Rheumatology

## 2018-03-09 DIAGNOSIS — Z8639 Personal history of other endocrine, nutritional and metabolic disease: Secondary | ICD-10-CM | POA: Insufficient documentation

## 2018-03-09 DIAGNOSIS — Z8261 Family history of arthritis: Secondary | ICD-10-CM | POA: Insufficient documentation

## 2018-03-09 NOTE — Progress Notes (Signed)
Office Visit Note  Patient: Ann Newton             Date of Birth: 08-Mar-1955           MRN: [id number]             PCP: Libby Maw, MD Referring: Libby Maw,* Visit Date: 03/16/2018 Occupation: '@GUAROCC'$ @  Subjective:  No chief complaint on file.   History of Present Illness: Ann Newton is a 63 y.o. female with history of pustular psoriasis.  She is currently taking Otezla 30 mg twice daily and uses clobetasol cream as needed once or twice a week. She reports having a rash on the soles of her feet and palms of her hands. Also reports pain in bilateral hands. She reports discomfort mainly in her knees and sometimes in her hips and shoulders.  Aspirated her left knee joint during the last visit for effusion.  It was also injected with cortisone.  She has had no recurrence of effusion since then.  She states she gets the effusion about once a year.  She also goes to Dr. French Ana and gets Visco supplement injections.  She feels some stiffness in her hands.  She denies any joint swelling.  Activities of Daily Living:  Patient reports morning stiffness for 1 to 2 hour.   Patient Denies nocturnal pain.  Difficulty dressing/grooming: Denies Difficulty climbing stairs: Denies Difficulty getting out of chair: Denies Difficulty using hands for taps, buttons, cutlery, and/or writing: Denies  Review of Systems  Constitutional: Negative for fatigue, night sweats, weight gain and weight loss.  HENT: Negative for mouth sores, trouble swallowing, trouble swallowing, mouth dryness and nose dryness.   Eyes: Negative for pain, redness, visual disturbance and dryness.  Respiratory: Negative for cough, shortness of breath and difficulty breathing.   Cardiovascular: Negative for chest pain, palpitations, hypertension, irregular heartbeat and swelling in legs/feet.  Gastrointestinal: Negative for abdominal pain, blood in stool, constipation and diarrhea.  Endocrine:  Negative for increased urination.  Genitourinary: Negative for vaginal dryness.  Musculoskeletal: Positive for arthralgias, joint pain, myalgias, morning stiffness, muscle tenderness and myalgias. Negative for joint swelling and muscle weakness.  Skin: Positive for rash. Negative for color change, hair loss, skin tightness, ulcers and sensitivity to sunlight.  Allergic/Immunologic: Negative for susceptible to infections.  Neurological: Negative for dizziness, memory loss, night sweats and weakness.  Hematological: Negative for swollen glands.  Psychiatric/Behavioral: Negative for depressed mood and sleep disturbance. The patient is not nervous/anxious.     PMFS History:  Patient Active Problem List   Diagnosis Date Noted  . Family history of rheumatoid arthritis 03/09/2018  . History of vitamin D deficiency 03/09/2018  . Primary osteoarthritis of both knees 02/22/2018  . Primary osteoarthritis of both hands 02/22/2018  . Neuropathy 11/22/2017  . Chronic pain of both knees 11/22/2017  . Need for pneumococcal vaccination 11/22/2017  . Vitamin D deficiency 01/24/2016  . Smoking hx- ( less 1 ppd * 15 yrs) - Quit Jan 17 2013 12/28/2015  . Elevated LDL cholesterol level 12/28/2015  . Elevated vitamin B12 level 12/28/2015  . h/o Mild peripheral edema- takes HCTZ for this 12/20/2015  . Dietary B12 deficiency 12/20/2015  . History of chickenpox ( Aug '17 )  12/20/2015  . Screen for colon cancer 12/20/2015  . h/o being Overweight 12/03/2015  . h/o Hypertriglyceridemia 12/03/2015  . h/o Hyperlipidemia 12/03/2015  . Pustular psoriasis of palms and soles 10/28/2010    Past Medical History:  Diagnosis  Date  . Arthritis   . Hyperlipidemia    diet controlled  . Psoriasis   . Thyroid disease   . Wears contact lenses     Family History  Problem Relation Age of Onset  . Kidney disease Mother   . Diabetes Mother   . Heart attack Father   . Rheum arthritis Brother   . Healthy Daughter   .  Healthy Son   . Heart attack Maternal Grandmother   . Heart attack Maternal Grandfather   . Cancer Paternal Grandmother   . Heart attack Paternal Grandfather   . Rheum arthritis Brother    Past Surgical History:  Procedure Laterality Date  . ABDOMINAL HYSTERECTOMY  2000   part  . ANTERIOR AND POSTERIOR REPAIR N/A 09/08/2016   Procedure: ANTERIOR (CYSTOCELE) AND POSTERIOR REPAIR (RECTOCELE)/Perineorrhaphy;  Surgeon: Brien Few, MD;  Location: [facility];  Service: Gynecology;  Laterality: N/A;  Requests 42mn. Repair of enterocele/posterior  . BREAST SURGERY  2001   bil implants  . COLONOSCOPY    . COSMETIC SURGERY  1999   breast implants  . HAGLAND'S DEFORMITY EXCISION Left 09/13/2013   Procedure: LEFT FOOT: EXCISION INTERDIGITAL MORTON'S NEUROMA SINGLE EACH;  Surgeon: WYvette Rack, MD;  Location: [facility]  Service: Orthopedics;  Laterality: Left;  . KNEE ARTHROSCOPY  2013,2011   right/left  . SHOULDER ARTHROSCOPY     rt  . TUBAL LIGATION     Social History   Social History Narrative  . Not on file   Immunization History  Administered Date(s) Administered  . Influenza,inj,Quad PF,6+ Mos 12/03/2015, 11/22/2017  . Pneumococcal Polysaccharide-23 11/22/2017  . Tdap 02/09/2009     Objective: Vital Signs: BP 111/69 (BP Location: Left Arm, Patient Position: Sitting, Cuff Size: Normal)   Pulse (!) 101   Resp 12   Ht '5\' 8"'$  (1.727 m)   Wt 179 lb 12.8 oz (81.6 kg)   BMI 27.34 kg/m    Physical Exam Vitals signs and nursing note reviewed.  Constitutional:      Appearance: She is well-developed.  HENT:     Head: Normocephalic and atraumatic.  Eyes:     Conjunctiva/sclera: Conjunctivae normal.  Neck:     Musculoskeletal: Normal range of motion.  Cardiovascular:     Rate and Rhythm: Normal rate and regular rhythm.     Heart sounds: Normal heart sounds.  Pulmonary:     Effort: Pulmonary effort is normal.     Breath sounds: Normal breath sounds.    Abdominal:     General: Bowel sounds are normal.     Palpations: Abdomen is soft.  Lymphadenopathy:     Cervical: No cervical adenopathy.  Skin:    General: Skin is warm and dry.     Capillary Refill: Capillary refill takes less than 2 seconds.  Neurological:     Mental Status: She is alert and oriented to person, place, and time.  Psychiatric:        Behavior: Behavior normal.      Musculoskeletal Exam: C-spine, thoracic and lumbar spine good range of motion.  She had no SI joint tenderness.  Shoulder joints elbow joints wrist joint MCPs PIPs DIPs been good range of motion with no synovitis.  Hip joints knee joints ankles MTPs PIPs been good range of motion.  She has warmth and some swelling in her left knee joint.  CDAI Exam: CDAI Score: Not documented Patient Global Assessment: Not documented; Provider Global Assessment: Not documented Swollen: Not  documented; Tender: Not documented Joint Exam   Not documented   There is currently no information documented on the homunculus. Go to the Rheumatology activity and complete the homunculus joint exam.  Investigation: No additional findings.  Imaging: No results found.  Recent Labs: Lab Results  Component Value Date   WBC 6.4 11/22/2017   HGB 13.5 11/22/2017   PLT 436.0 (H) 11/22/2017   NA 141 11/22/2017   K 4.9 11/22/2017   CL 103 11/22/2017   CO2 28 11/22/2017   GLUCOSE 101 (H) 11/22/2017   BUN 10 11/22/2017   CREATININE 0.79 11/22/2017   BILITOT 0.3 11/22/2017   ALKPHOS 92 11/22/2017   AST 21 11/22/2017   ALT 28 11/22/2017   PROT 7.5 11/22/2017   ALBUMIN 4.4 11/22/2017   CALCIUM 10.0 11/22/2017   GFRAA >60 09/02/2016  02/14/2018 synovial fluid WBC 896, crystals negative, RF negative, anti-CCP negative, 14-3-3 eta negative, ANA negative, ACE negative, uric acid 5.0, CK normal,UA neg, TSH normal, ESR 29, hepatitis B negative, hepatitis C negative  Speciality Comments: No specialty comments  available.  Procedures:  No procedures performed Allergies: Patient has no known allergies.   Assessment / Plan:     Visit Diagnoses: Pustular psoriasis of palms and soles - On Otezla for the last 3 years.  She has few outbreaks of her rash.  Effusion, left knee - Crystals negative synovial fluid WBC 896.  Knee joint was injected and aspirated last visit.  She is still have some warmth in her left knee joint.  I discussed possibility of psoriatic arthritis.  She has taken methotrexate in the past and did not like the side effects.  I also discussed possible use of sulfasalazine which she declined.  At this time I have given her a list of natural anti-inflammatories and also give her prescription for diclofenac gel which can be used topically.  Indication side effects contraindications were discussed.  Primary osteoarthritis of both knees - Bilateral moderate lateral compartment narrowing and mild chondromalacia patella.  She has been followed by Dr. French Ana and gets Visco supplement injections.  Primary osteoarthritis of both hands-she had no synovitis on examination.  Have advised her to contact me in case she develops increased swelling.  Family history of rheumatoid arthritis - In 2 of her brothers  Smoking hx- ( less 1 ppd * 15 yrs) - Quit Jan 17 2013  Neuropathy  h/o Hypertriglyceridemia  History of vitamin D deficiency   Orders: No orders of the defined types were placed in this encounter.  Meds ordered this encounter  Medications  . diclofenac sodium (VOLTAREN) 1 % GEL    Sig: 3 grams to 3 large joints up to 3 times daily    Dispense:  3 Tube    Refill:  3    Face-to-face time spent with patient was 30 minutes. Greater than 50% of time was spent in counseling and coordination of care.  Follow-Up Instructions: Return in about 6 months (around 09/14/2018) for Osteoarthritis, Ps.   Bo Merino, MD  Note - This record has been created using Editor, commissioning.  Chart  creation errors have been sought, but may not always  have been located. Such creation errors do not reflect on  the standard of medical care.

## 2018-03-16 ENCOUNTER — Encounter: Payer: Self-pay | Admitting: Rheumatology

## 2018-03-16 ENCOUNTER — Ambulatory Visit (INDEPENDENT_AMBULATORY_CARE_PROVIDER_SITE_OTHER): Payer: 59 | Admitting: Rheumatology

## 2018-03-16 VITALS — BP 111/69 | HR 101 | Resp 12 | Ht 68.0 in | Wt 179.8 lb

## 2018-03-16 DIAGNOSIS — Z87891 Personal history of nicotine dependence: Secondary | ICD-10-CM

## 2018-03-16 DIAGNOSIS — M19042 Primary osteoarthritis, left hand: Secondary | ICD-10-CM

## 2018-03-16 DIAGNOSIS — M170 Bilateral primary osteoarthritis of knee: Secondary | ICD-10-CM | POA: Diagnosis not present

## 2018-03-16 DIAGNOSIS — L403 Pustulosis palmaris et plantaris: Secondary | ICD-10-CM | POA: Diagnosis not present

## 2018-03-16 DIAGNOSIS — Z8639 Personal history of other endocrine, nutritional and metabolic disease: Secondary | ICD-10-CM

## 2018-03-16 DIAGNOSIS — M19041 Primary osteoarthritis, right hand: Secondary | ICD-10-CM | POA: Diagnosis not present

## 2018-03-16 DIAGNOSIS — Z8261 Family history of arthritis: Secondary | ICD-10-CM

## 2018-03-16 DIAGNOSIS — E781 Pure hyperglyceridemia: Secondary | ICD-10-CM

## 2018-03-16 DIAGNOSIS — G629 Polyneuropathy, unspecified: Secondary | ICD-10-CM

## 2018-03-16 DIAGNOSIS — M25462 Effusion, left knee: Secondary | ICD-10-CM | POA: Diagnosis not present

## 2018-03-16 MED ORDER — DICLOFENAC SODIUM 1 % TD GEL: TRANSDERMAL | 3 refills | Status: AC

## 2018-05-17 ENCOUNTER — Ambulatory Visit (INDEPENDENT_AMBULATORY_CARE_PROVIDER_SITE_OTHER): Payer: 59 | Admitting: Family Medicine

## 2018-05-17 ENCOUNTER — Encounter: Payer: Self-pay | Admitting: Family Medicine

## 2018-05-17 VITALS — Ht 68.0 in

## 2018-05-17 DIAGNOSIS — T7840XA Allergy, unspecified, initial encounter: Secondary | ICD-10-CM | POA: Diagnosis not present

## 2018-05-17 DIAGNOSIS — R21 Rash and other nonspecific skin eruption: Secondary | ICD-10-CM

## 2018-05-17 DIAGNOSIS — E7800 Pure hypercholesterolemia, unspecified: Secondary | ICD-10-CM

## 2018-05-17 MED ORDER — PREDNISONE 10 MG (21) PO TBPK: ORAL_TABLET | ORAL | 0 refills | Status: DC

## 2018-05-17 NOTE — Progress Notes (Signed)
Virtual Visit via Video Note  I connected with Ann Newton on 05/17/18 at  1:00 PM EDT by a video enabled telemedicine application and verified that I am speaking with the correct person using two identifiers.  Location: Patient: home Provider: work   I discussed the limitations of evaluation and management by telemedicine and the availability of in person appointments. The patient expressed understanding and agreed to proceed.  History of Present Illness:    Observations/Objective:   Assessment and Plan:   Follow Up Instructions:    I discussed the assessment and treatment plan with the patient. The patient was provided an opportunity to ask questions and all were answered. The patient agreed with the plan and demonstrated an understanding of the instructions.   The patient was advised to call back or seek an in-person evaluation if the symptoms worsen or if the condition fails to improve as anticipated.  I provided 20   Established Patient Office Visit  Subjective:  Patient ID: Ann Newton, female    DOB: [birth year]  Age: 63 y.o. MRN: [id number]  CC:  Chief Complaint  Patient presents with  . Rash    HPI [name] presents for evaluation and treatment of a pruritic rash on her trunk present for about a week now.  Itching is responded to Zyrtec.  She denies any changes in her skin contacts, soaps, detergents.  She has not been ill with an upper respiratory tract infection fever headache nausea or vomiting.  Other family members are not affected.  The only 2 medicines she takes orally are [place] and Crestor.  She has been on the former for 3 years without issue.  She started the Crestor 3 months ago and has been taking it without issue.  Past Medical History:  Diagnosis Date  . Arthritis   . Hyperlipidemia    diet controlled  . Psoriasis   . Thyroid disease   . Wears contact lenses     Past Surgical History:  Procedure Laterality Date  . ABDOMINAL  HYSTERECTOMY  2000   part  . ANTERIOR AND POSTERIOR REPAIR N/A 09/08/2016   Procedure: ANTERIOR (CYSTOCELE) AND POSTERIOR REPAIR (RECTOCELE)/Perineorrhaphy;  Surgeon: Olivia Mackie, MD;  Location: [facility];  Service: Gynecology;  Laterality: N/A;  Requests . Repair of enterocele/posterior  . BREAST SURGERY  2001   bil implants  . COLONOSCOPY    . COSMETIC SURGERY  1999   breast implants  . HAGLAND'S DEFORMITY EXCISION Left 09/13/2013   Procedure: LEFT FOOT: EXCISION INTERDIGITAL MORTON'S NEUROMA SINGLE EACH;  Surgeon: Thera Flake., MD;  Location: [facility];  Service: Orthopedics;  Laterality: Left;  . KNEE ARTHROSCOPY  2013,2011   right/left  . SHOULDER ARTHROSCOPY     rt  . TUBAL LIGATION      Family History  Problem Relation Age of Onset  . Kidney disease Mother   . Diabetes Mother   . Heart attack Father   . Rheum arthritis Brother   . Healthy Daughter   . Healthy Son   . Heart attack Maternal Grandmother   . Heart attack Maternal Grandfather   . Cancer Paternal Grandmother   . Heart attack Paternal Grandfather   . Rheum arthritis Brother     Social History   Socioeconomic History  . Marital status: Married    Spouse name: Not on file  . Number of children: Not on file  . Years of education: Not on file  . Highest education  level: Not on file  Occupational History  . Not on file  Social Needs  . Financial resource strain: Not on file  . Food insecurity:    Worry: Not on file    Inability: Not on file  . Transportation needs:    Medical: Not on file    Non-medical: Not on file  Tobacco Use  . Smoking status: Former Smoker    Packs/day: 0.50    Years: 10.00    Pack years: 5.00    Types: Cigarettes    Last attempt to quit: 09/12/2012    Years since quitting: 5.6  . Smokeless tobacco: Never Used  Substance and Sexual Activity  . Alcohol use: No  . Drug use: No  . Sexual activity: Yes    Birth control/protection: Surgical  Lifestyle   . Physical activity:    Days per week: Not on file    Minutes per session: Not on file  . Stress: Not on file  Relationships  . Social connections:    Talks on phone: Not on file    Gets together: Not on file    Attends religious service: Not on file    Active member of club or organization: Not on file    Attends meetings of clubs or organizations: Not on file    Relationship status: Not on file  . Intimate partner violence:    Fear of current or ex partner: Not on file    Emotionally abused: Not on file    Physically abused: Not on file    Forced sexual activity: Not on file  Other Topics Concern  . Not on file  Social History Narrative  . Not on file    Outpatient Medications Prior to Visit  Medication Sig Dispense Refill  . Apremilast (OTEZLA) 30 MG TABS Take 30 mg by mouth 2 (two) times daily.     . Biotin 1610910000 MCG TABS Take 1 tablet by mouth daily.    . clobetasol ointment (TEMOVATE) 0.05 %     . diclofenac sodium (VOLTAREN) 1 % GEL 3 grams to 3 large joints up to 3 times daily 3 Tube 3  . metroNIDAZOLE (METROGEL) 0.75 % gel     . Multiple Vitamins-Minerals (CENTRUM SILVER 50+WOMEN) TABS Take 1 tablet by mouth daily.    . rosuvastatin (CRESTOR) 20 MG tablet Take 1 tablet (20 mg total) by mouth daily. 90 tablet 1   No facility-administered medications prior to visit.     No Known Allergies  ROS Review of Systems  Constitutional: Negative for chills, diaphoresis, fatigue, fever and unexpected weight change.  HENT: Negative.   Eyes: Negative for photophobia and visual disturbance.  Respiratory: Negative.   Cardiovascular: Negative.   Gastrointestinal: Negative.   Genitourinary: Negative.   Skin: Positive for color change and rash. Negative for pallor.  Neurological: Negative for weakness and numbness.  Hematological: Does not bruise/bleed easily.  Psychiatric/Behavioral: Negative.       Objective:    Physical Exam  Constitutional: She is oriented to  person, place, and time. She appears well-developed and well-nourished. No distress.  HENT:  Head: Normocephalic and atraumatic.  Right Ear: External ear normal.  Left Ear: External ear normal.  Eyes: Conjunctivae are normal. Right eye exhibits no discharge. Left eye exhibits no discharge. No scleral icterus.  Pulmonary/Chest: Effort normal.  Neurological: She is alert and oriented to person, place, and time.  Skin: Skin is warm and dry. She is not diaphoretic.     Psychiatric:  She has a normal mood and affect. Her behavior is normal.    Ht 5\' 8"  (1.727 m)   BMI 27.34 kg/m  Wt Readings from Last 3 Encounters:  03/16/18 179 lb 12.8 oz (81.6 kg)  02/14/18 183 lb (83 kg)  11/22/17 184 lb 6 oz (83.6 kg)     Health Maintenance Due  Topic Date Due  . HIV Screening  12/24/1971  . PAP SMEAR-Modifier  12/23/1977  . COLONOSCOPY  12/24/2006  . MAMMOGRAM  03/26/2017    There are no preventive care reminders to display for this patient.  Lab Results  Component Value Date   TSH 0.75 02/14/2018   Lab Results  Component Value Date   WBC 6.4 11/22/2017   HGB 13.5 11/22/2017   HCT 40.4 11/22/2017   MCV 91.5 11/22/2017   PLT 436.0 (H) 11/22/2017   Lab Results  Component Value Date   NA 141 11/22/2017   K 4.9 11/22/2017   CO2 28 11/22/2017   GLUCOSE 101 (H) 11/22/2017   BUN 10 11/22/2017   CREATININE 0.79 11/22/2017   BILITOT 0.3 11/22/2017   ALKPHOS 92 11/22/2017   AST 21 11/22/2017   ALT 28 11/22/2017   PROT 7.5 11/22/2017   ALBUMIN 4.4 11/22/2017   CALCIUM 10.0 11/22/2017   ANIONGAP 8 09/02/2016   GFR 78.66 11/22/2017   Lab Results  Component Value Date   CHOL 320 (H) 11/22/2017   Lab Results  Component Value Date   HDL 47.20 11/22/2017   Lab Results  Component Value Date   LDLCALC 162 (H) 06/14/2016   Lab Results  Component Value Date   TRIG 280.0 (H) 11/22/2017   Lab Results  Component Value Date   CHOLHDL 7 11/22/2017   Lab Results  Component  Value Date   HGBA1C 5.2 12/03/2015      Assessment & Plan:   Problem List Items Addressed This Visit      Musculoskeletal and Integument   Maculopapular rash   Relevant Medications   predniSONE (STERAPRED UNI-PAK 21 TAB) 10 MG (21) TBPK tablet   Other Relevant Orders   CBC   RPR     Other   Elevated LDL cholesterol level (Chronic)   Relevant Orders   Comprehensive metabolic panel   LDL cholesterol, direct   Lipid panel   Allergic reaction - Primary   Relevant Medications   predniSONE (STERAPRED UNI-PAK 21 TAB) 10 MG (21) TBPK tablet   Other Relevant Orders   CBC      Meds ordered this encounter  Medications  . predniSONE (STERAPRED UNI-PAK 21 TAB) 10 MG (21) TBPK tablet    Sig: Take 6 today, 5 tomorrow, 4 the next day and then 3, 2, 1 and stop    Dispense:  21 tablet    Refill:  0    Follow-up: Return if symptoms worsen or fail to improve.    Mliss Sax, MD 18 minutes of non-face-to-face time during this encounter.   Differential diagnoses to include allergic reaction, contact dermatitis, heat rash.  6-day Dosepak should help clear the rash.  Will consider holding her [place] and/or Crestor if the rash returns after finishing the Dosepak.

## 2018-05-25 ENCOUNTER — Encounter: Payer: Self-pay | Admitting: Family Medicine

## 2018-05-25 DIAGNOSIS — R21 Rash and other nonspecific skin eruption: Secondary | ICD-10-CM

## 2018-05-25 MED ORDER — PREDNISONE 10 MG (21) PO TBPK: ORAL_TABLET | ORAL | 0 refills | Status: DC

## 2018-08-30 NOTE — Progress Notes (Deleted)
Office Visit Note  Patient: Ann Newton             Date of Birth: [birth year]           MRN: [id number]             PCP: Libby Maw, MD Referring: Libby Maw,* Visit Date: 09/13/2018 Occupation: @GUAROCC @  Subjective:  No chief complaint on file.   History of Present Illness: Ann Newton is a 63 y.o. female ***   Activities of Daily Living:  Patient reports morning stiffness for *** {minute/hour:19697}.   Patient {ACTIONS;DENIES/REPORTS:21021675::"Denies"} nocturnal pain.  Difficulty dressing/grooming: {ACTIONS;DENIES/REPORTS:21021675::"Denies"} Difficulty climbing stairs: {ACTIONS;DENIES/REPORTS:21021675::"Denies"} Difficulty getting out of chair: {ACTIONS;DENIES/REPORTS:21021675::"Denies"} Difficulty using hands for taps, buttons, cutlery, and/or writing: {ACTIONS;DENIES/REPORTS:21021675::"Denies"}  No Rheumatology ROS completed.   PMFS History:  Patient Active Problem List   Diagnosis Date Noted  . Allergic reaction 05/17/2018  . Maculopapular rash 05/17/2018  . Family history of rheumatoid arthritis 03/09/2018  . History of vitamin D deficiency 03/09/2018  . Primary osteoarthritis of both knees 02/22/2018  . Primary osteoarthritis of both hands 02/22/2018  . Neuropathy 11/22/2017  . Chronic pain of both knees 11/22/2017  . Need for pneumococcal vaccination 11/22/2017  . Vitamin D deficiency 01/24/2016  . Smoking hx- ( less 1 ppd * 15 yrs) - Quit Jan 17 2013 12/28/2015  . Elevated LDL cholesterol level 12/28/2015  . Elevated vitamin B12 level 12/28/2015  . h/o Mild peripheral edema- takes HCTZ for this 12/20/2015  . Dietary B12 deficiency 12/20/2015  . History of chickenpox ( Aug '17 )  12/20/2015  . Screen for colon cancer 12/20/2015  . h/o being Overweight 12/03/2015  . h/o Hypertriglyceridemia 12/03/2015  . h/o Hyperlipidemia 12/03/2015  . Pustular psoriasis of palms and soles 10/28/2010    Past Medical History:  Diagnosis  Date  . Arthritis   . Hyperlipidemia    diet controlled  . Psoriasis   . Thyroid disease   . Wears contact lenses     Family History  Problem Relation Age of Onset  . Kidney disease Mother   . Diabetes Mother   . Heart attack Father   . Rheum arthritis Brother   . Healthy Daughter   . Healthy Son   . Heart attack Maternal Grandmother   . Heart attack Maternal Grandfather   . Cancer Paternal Grandmother   . Heart attack Paternal Grandfather   . Rheum arthritis Brother    Past Surgical History:  Procedure Laterality Date  . ABDOMINAL HYSTERECTOMY  2000   part  . ANTERIOR AND POSTERIOR REPAIR N/A 09/08/2016   Procedure: ANTERIOR (CYSTOCELE) AND POSTERIOR REPAIR (RECTOCELE)/Perineorrhaphy;  Surgeon: Brien Few, MD;  Location: [facility];  Service: Gynecology;  Laterality: N/A;  Requests 38min. Repair of enterocele/posterior  . BREAST SURGERY  2001   bil implants  . COLONOSCOPY    . COSMETIC SURGERY  1999   breast implants  . HAGLAND'S DEFORMITY EXCISION Left 09/13/2013   Procedure: LEFT FOOT: EXCISION INTERDIGITAL MORTON'S NEUROMA SINGLE EACH;  Surgeon: Yvette Rack., MD;  Location: [facility];  Service: Orthopedics;  Laterality: Left;  . KNEE ARTHROSCOPY  2013,2011   right/left  . SHOULDER ARTHROSCOPY     rt  . TUBAL LIGATION     Social History   Social History Narrative  . Not on file   Immunization History  Administered Date(s) Administered  . Influenza,inj,Quad PF,6+ Mos 12/03/2015, 11/22/2017  . Pneumococcal Polysaccharide-23 11/22/2017  . Tdap  02/09/2009     Objective: Vital Signs: There were no vitals taken for this visit.   Physical Exam   Musculoskeletal Exam: ***  CDAI Exam: CDAI Score: - Patient Global: -; Provider Global: - Swollen: -; Tender: - Joint Exam   No joint exam has been documented for this visit   There is currently no information documented on the homunculus. Go to the Rheumatology activity and complete the  homunculus joint exam.  Investigation: No additional findings.  Imaging: No results found.  Recent Labs: Lab Results  Component Value Date   WBC 6.4 11/22/2017   HGB 13.5 11/22/2017   PLT 436.0 (H) 11/22/2017   NA 141 11/22/2017   K 4.9 11/22/2017   CL 103 11/22/2017   CO2 28 11/22/2017   GLUCOSE 101 (H) 11/22/2017   BUN 10 11/22/2017   CREATININE 0.79 11/22/2017   BILITOT 0.3 11/22/2017   ALKPHOS 92 11/22/2017   AST 21 11/22/2017   ALT 28 11/22/2017   PROT 7.5 11/22/2017   ALBUMIN 4.4 11/22/2017   CALCIUM 10.0 11/22/2017   GFRAA >60 09/02/2016    Speciality Comments: No specialty comments available.  Procedures:  No procedures performed Allergies: Patient has no known allergies.   Assessment / Plan:     Visit Diagnoses: No diagnosis found.  Orders: No orders of the defined types were placed in this encounter.  No orders of the defined types were placed in this encounter.   Face-to-face time spent with patient was *** minutes. Greater than 50% of time was spent in counseling and coordination of care.  Follow-Up Instructions: No follow-ups on file.   Gearldine Bienenstockaylor M Mayfield Schoene, PA-C  Note - This record has been created using Dragon software.  Chart creation errors have been sought, but may not always  have been located. Such creation errors do not reflect on  the standard of medical care.

## 2018-09-13 ENCOUNTER — Ambulatory Visit: Payer: Self-pay | Admitting: Physician Assistant

## 2018-11-08 ENCOUNTER — Telehealth: Payer: Self-pay

## 2018-11-08 NOTE — Telephone Encounter (Signed)

## 2018-11-09 ENCOUNTER — Encounter: Payer: Self-pay | Admitting: Family Medicine

## 2018-11-09 ENCOUNTER — Ambulatory Visit (INDEPENDENT_AMBULATORY_CARE_PROVIDER_SITE_OTHER): Payer: 59 | Admitting: Family Medicine

## 2018-11-09 ENCOUNTER — Encounter: Payer: Self-pay | Admitting: Gastroenterology

## 2018-11-09 ENCOUNTER — Other Ambulatory Visit: Payer: Self-pay

## 2018-11-09 VITALS — BP 120/70 | HR 90 | Temp 97.6°F | Ht 68.0 in | Wt 189.0 lb

## 2018-11-09 DIAGNOSIS — Z0000 Encounter for general adult medical examination without abnormal findings: Secondary | ICD-10-CM | POA: Diagnosis not present

## 2018-11-09 DIAGNOSIS — R7309 Other abnormal glucose: Secondary | ICD-10-CM | POA: Diagnosis not present

## 2018-11-09 DIAGNOSIS — E010 Iodine-deficiency related diffuse (endemic) goiter: Secondary | ICD-10-CM

## 2018-11-09 DIAGNOSIS — E538 Deficiency of other specified B group vitamins: Secondary | ICD-10-CM

## 2018-11-09 DIAGNOSIS — R0683 Snoring: Secondary | ICD-10-CM | POA: Insufficient documentation

## 2018-11-09 DIAGNOSIS — E559 Vitamin D deficiency, unspecified: Secondary | ICD-10-CM

## 2018-11-09 DIAGNOSIS — R5383 Other fatigue: Secondary | ICD-10-CM | POA: Diagnosis not present

## 2018-11-09 DIAGNOSIS — E7800 Pure hypercholesterolemia, unspecified: Secondary | ICD-10-CM

## 2018-11-09 DIAGNOSIS — Z23 Encounter for immunization: Secondary | ICD-10-CM | POA: Insufficient documentation

## 2018-11-09 DIAGNOSIS — L659 Nonscarring hair loss, unspecified: Secondary | ICD-10-CM | POA: Diagnosis not present

## 2018-11-09 DIAGNOSIS — E041 Nontoxic single thyroid nodule: Secondary | ICD-10-CM

## 2018-11-09 LAB — CBC
HCT: 39.5 % (ref 36.0–46.0)
Hemoglobin: 13.3 g/dL (ref 12.0–15.0)
MCHC: 33.6 g/dL (ref 30.0–36.0)
MCV: 91.6 fl (ref 78.0–100.0)
Platelets: 398 10*3/uL (ref 150.0–400.0)
RBC: 4.31 Mil/uL (ref 3.87–5.11)
RDW: 13.7 % (ref 11.5–15.5)
WBC: 7.2 10*3/uL (ref 4.0–10.5)

## 2018-11-09 LAB — COMPREHENSIVE METABOLIC PANEL
ALT: 28 U/L (ref 0–35)
AST: 21 U/L (ref 0–37)
Albumin: 4.3 g/dL (ref 3.5–5.2)
Alkaline Phosphatase: 101 U/L (ref 39–117)
BUN: 15 mg/dL (ref 6–23)
CO2: 28 mEq/L (ref 19–32)
Calcium: 9.6 mg/dL (ref 8.4–10.5)
Chloride: 102 mEq/L (ref 96–112)
Creatinine, Ser: 0.78 mg/dL (ref 0.40–1.20)
GFR: 74.87 mL/min (ref >60.00)
Glucose, Bld: 99 mg/dL (ref 70–99)
Potassium: 4.2 mEq/L (ref 3.5–5.1)
Sodium: 139 mEq/L (ref 135–145)
Total Bilirubin: 0.5 mg/dL (ref 0.2–1.2)
Total Protein: 7.2 g/dL (ref 6.0–8.3)

## 2018-11-09 LAB — LIPID PANEL
Cholesterol: 319 mg/dL — ABNORMAL HIGH (ref 0–200)
HDL: 48.3 mg/dL (ref >39.00)
LDL Cholesterol: 233 mg/dL — ABNORMAL HIGH (ref 0–99)
NonHDL: 270.42
Total CHOL/HDL Ratio: 7
Triglycerides: 188 mg/dL — ABNORMAL HIGH (ref 0.0–149.0)
VLDL: 37.6 mg/dL (ref 0.0–40.0)

## 2018-11-09 LAB — LDL CHOLESTEROL, DIRECT: Direct LDL: 221 mg/dL

## 2018-11-09 LAB — VITAMIN B12: Vitamin B-12: 835 pg/mL (ref 211–911)

## 2018-11-09 LAB — VITAMIN D 25 HYDROXY (VIT D DEFICIENCY, FRACTURES): VITD: 30.08 ng/mL (ref 30.00–100.00)

## 2018-11-09 LAB — TSH: TSH: 0.65 u[IU]/mL (ref 0.35–4.50)

## 2018-11-09 LAB — HEMOGLOBIN A1C: Hgb A1c MFr Bld: 5.8 % (ref 4.6–6.5)

## 2018-11-09 NOTE — Progress Notes (Addendum)
Established Patient Office Visit  Subjective:  Patient ID: Ann Newton, female    DOB: [birth year]  Age: 63 y.o. MRN: [id number]  CC:  Chief Complaint  Patient presents with  . Annual Exam    HPI Ann Newton presents for follow-up of her elevated cholesterol with multiple issues and concerns.  Decided to discontinue her Crestor some months ago.  She has also been experiencing fatigue, weight gain and hair loss over the last several months.  Patient denies stress.  She has been working from home before [employer] and that has not changed for her.  Things seem to be going well at home and at work.  She is not exercising.  Mom had a history of diabetes.  Patient's husband tells her that she snores loudly.  She denies apneic episodes or feeling unrested in the morning.  She is interested in starting the Shingrix series.  She quit tobacco many years ago and was not a heavy smoker when she did smoke.  Normal Pap smear through her GYN this past year.  Past Medical History:  Diagnosis Date  . Arthritis   . Hyperlipidemia    diet controlled  . Psoriasis   . Thyroid disease   . Wears contact lenses     Past Surgical History:  Procedure Laterality Date  . ABDOMINAL HYSTERECTOMY  2000   part  . ANTERIOR AND POSTERIOR REPAIR N/A 09/08/2016   Procedure: ANTERIOR (CYSTOCELE) AND POSTERIOR REPAIR (RECTOCELE)/Perineorrhaphy;  Surgeon: Olivia Mackie, MD;  Location: [facility];  Service: Gynecology;  Laterality: N/A;  Requests . Repair of enterocele/posterior  . BREAST SURGERY  2001   bil implants  . COLONOSCOPY    . COSMETIC SURGERY  1999   breast implants  . HAGLAND'S DEFORMITY EXCISION Left 09/13/2013   Procedure: LEFT FOOT: EXCISION INTERDIGITAL MORTON'S NEUROMA SINGLE EACH;  Surgeon: Thera Flake., MD;  Location: [facility];  Service: Orthopedics;  Laterality: Left;  . KNEE ARTHROSCOPY  2013,2011   right/left  . SHOULDER ARTHROSCOPY     rt  . TUBAL LIGATION       Family History  Problem Relation Age of Onset  . Kidney disease Mother   . Diabetes Mother   . Heart attack Father   . Rheum arthritis Brother   . Healthy Daughter   . Healthy Son   . Heart attack Maternal Grandmother   . Heart attack Maternal Grandfather   . Cancer Paternal Grandmother   . Heart attack Paternal Grandfather   . Rheum arthritis Brother     Social History   Socioeconomic History  . Marital status: Married    Spouse name: Not on file  . Number of children: Not on file  . Years of education: Not on file  . Highest education level: Not on file  Occupational History  . Not on file  Social Needs  . Financial resource strain: Not on file  . Food insecurity    Worry: Not on file    Inability: Not on file  . Transportation needs    Medical: Not on file    Non-medical: Not on file  Tobacco Use  . Smoking status: Former Smoker    Packs/day: 0.50    Years: 10.00    Pack years: 5.00    Types: Cigarettes    Quit date: 09/12/2012    Years since quitting: 6.1  . Smokeless tobacco: Never Used  Substance and Sexual Activity  . Alcohol use: No  .  Drug use: No  . Sexual activity: Yes    Birth control/protection: Surgical  Lifestyle  . Physical activity    Days per week: Not on file    Minutes per session: Not on file  . Stress: Not on file  Relationships  . Social Musician on phone: Not on file    Gets together: Not on file    Attends religious service: Not on file    Active member of club or organization: Not on file    Attends meetings of clubs or organizations: Not on file    Relationship status: Not on file  . Intimate partner violence    Fear of current or ex partner: Not on file    Emotionally abused: Not on file    Physically abused: Not on file    Forced sexual activity: Not on file  Other Topics Concern  . Not on file  Social History Narrative  . Not on file    Outpatient Medications Prior to Visit  Medication Sig Dispense  Refill  . Apremilast (OTEZLA) 30 MG TABS Take 30 mg by mouth 2 (two) times daily.     . Biotin 24235 MCG TABS Take 1 tablet by mouth daily.    . clobetasol ointment (TEMOVATE) 0.05 %     . diclofenac sodium (VOLTAREN) 1 % GEL 3 grams to 3 large joints up to 3 times daily 3 Tube 3  . metroNIDAZOLE (METROGEL) 0.75 % gel     . Multiple Vitamins-Minerals (CENTRUM SILVER 50+WOMEN) TABS Take 1 tablet by mouth daily.    . rosuvastatin (CRESTOR) 20 MG tablet Take 1 tablet (20 mg total) by mouth daily. 90 tablet 1  . predniSONE (STERAPRED UNI-PAK 21 TAB) 10 MG (21) TBPK tablet Take 6 today, 5 tomorrow, 4 the next day and then 3, 2, 1 and stop 21 tablet 0  . predniSONE (STERAPRED UNI-PAK 21 TAB) 10 MG (21) TBPK tablet Take 6 today, 5 tomorrow, 4 the next day and then 3, 2, 1 and stop 21 tablet 0   No facility-administered medications prior to visit.     No Known Allergies  ROS Review of Systems  Constitutional: Positive for fatigue. Negative for chills, diaphoresis, fever and unexpected weight change.  HENT: Negative.   Eyes: Negative for photophobia and visual disturbance.  Respiratory: Negative.   Cardiovascular: Negative.   Gastrointestinal: Negative.   Endocrine: Negative for polyphagia and polyuria.  Genitourinary: Negative for frequency and urgency.  Musculoskeletal: Positive for arthralgias.  Skin: Negative for pallor and rash.  Neurological: Negative for speech difficulty, light-headedness and numbness.  Hematological: Does not bruise/bleed easily.  Psychiatric/Behavioral: Negative.       Objective:    Physical Exam  Constitutional: She is oriented to person, place, and time. She appears well-developed and well-nourished. No distress.  HENT:  Head: Normocephalic and atraumatic.  Right Ear: External ear normal.  Left Ear: External ear normal.  Mouth/Throat: Uvula is midline and oropharynx is clear and moist. No oropharyngeal exudate, posterior oropharyngeal edema or posterior  oropharyngeal erythema.    Eyes: Pupils are equal, round, and reactive to light. Conjunctivae are normal. Right eye exhibits no discharge. Left eye exhibits no discharge. No scleral icterus.  Neck: Neck supple. No JVD present. No tracheal deviation present. No thyromegaly present.  Cardiovascular: Normal rate, regular rhythm and normal heart sounds.  Pulmonary/Chest: Effort normal and breath sounds normal. No stridor.  Abdominal: Bowel sounds are normal. She exhibits no distension.  Musculoskeletal:  General: No edema.  Lymphadenopathy:    She has no cervical adenopathy.  Neurological: She is alert and oriented to person, place, and time.  Skin: Skin is warm and dry. She is not diaphoretic.  Psychiatric: She has a normal mood and affect. Her behavior is normal.    BP 120/70   Pulse 90   Temp 97.6 F (36.4 C) (Tympanic)   Ht 5\' 8"  (1.727 m)   Wt 189 lb (85.7 kg)   SpO2 99%   BMI 28.74 kg/m  Wt Readings from Last 3 Encounters:  11/09/18 189 lb (85.7 kg)  03/16/18 179 lb 12.8 oz (81.6 kg)  02/14/18 183 lb (83 kg)   BP Readings from Last 3 Encounters:  11/09/18 120/70  03/16/18 111/69  02/14/18 117/72   Guideline developer:  UpToDate (see UpToDate for funding source) Date Released: June 2014  Health Maintenance Due  Topic Date Due  . COLONOSCOPY  12/24/2006    There are no preventive care reminders to display for this patient.  Lab Results  Component Value Date   TSH 0.65 11/09/2018   Lab Results  Component Value Date   WBC 7.2 11/09/2018   HGB 13.3 11/09/2018   HCT 39.5 11/09/2018   MCV 91.6 11/09/2018   PLT 398.0 11/09/2018   Lab Results  Component Value Date   NA 139 11/09/2018   K 4.2 11/09/2018   CO2 28 11/09/2018   GLUCOSE 99 11/09/2018   BUN 15 11/09/2018   CREATININE 0.78 11/09/2018   BILITOT 0.5 11/09/2018   ALKPHOS 101 11/09/2018   AST 21 11/09/2018   ALT 28 11/09/2018   PROT 7.2 11/09/2018   ALBUMIN 4.3 11/09/2018   CALCIUM 9.6  11/09/2018   ANIONGAP 8 09/02/2016   GFR 74.87 11/09/2018   Lab Results  Component Value Date   CHOL 319 (H) 11/09/2018   Lab Results  Component Value Date   HDL 48.30 11/09/2018   Lab Results  Component Value Date   LDLCALC 233 (H) 11/09/2018   Lab Results  Component Value Date   TRIG 188.0 (H) 11/09/2018   Lab Results  Component Value Date   CHOLHDL 7 11/09/2018   Lab Results  Component Value Date   HGBA1C 5.8 11/09/2018      Assessment & Plan:   Problem List Items Addressed This Visit      Endocrine   Thyromegaly   Relevant Orders   US THYROID (Completed)     Other   Dietary B12 deficiency (Chronic)   Relevant Orders   Vitamin B12 (Completed)   Elevated LDL cholesterol level (Chronic)   Relevant Medications   rosuvastatin (CRESTOR) 20 MG tablet   Healthcare maintenance - Primary   Relevant Orders   TSH (Completed)   Ambulatory referral to Gastroenterology   DG Bone Density (Completed)   Vitamin D deficiency   Relevant Orders   VITAMIN D 25 Hydroxy (Vit-D Deficiency, Fractures) (Completed)   Need for shingles vaccine   Relevant Orders   Varicella-zoster vaccine IM (Completed)   Fatigue   Relevant Orders   TSH (Completed)   Vitamin B12 (Completed)   Hemoglobin A1c (Completed)   Elevated glucose   Relevant Orders   Hemoglobin A1c (Completed)   Snores   Relevant Orders   Ambulatory referral to Sleep Studies   Hair loss   Relevant Orders   TSH (Completed)   Need for influenza vaccination   Relevant Orders   Flu Vaccine QUAD 36+ mos IM (Completed)  Meds ordered this encounter  Medications  . rosuvastatin (CRESTOR) 20 MG tablet    Sig: Take 1 tablet (20 mg total) by mouth daily.    Dispense:  90 tablet    Refill:  1    Follow-up: Return in about 6 months (around 05/10/2019).    Information given on exercising to lose weight.  Information on health maintenance and disease prevention given.  Agrees to go for screening colonoscopy  and bone density, treatment exams.  Sleep study for loud snoring.  Reinstate Crestor pending LDL levels.

## 2018-11-09 NOTE — Patient Instructions (Signed)
Exercising to Stay Healthy To become healthy and stay healthy, it is recommended that you do moderate-intensity and vigorous-intensity exercise. You can tell that you are exercising at a moderate intensity if your heart starts beating faster and you start breathing faster but can still hold a conversation. You can tell that you are exercising at a vigorous intensity if you are breathing much harder and faster and cannot hold a conversation while exercising. Exercising regularly is important. It has many health benefits, such as:  Improving overall fitness, flexibility, and endurance.  Increasing bone density.  Helping with weight control.  Decreasing body fat.  Increasing muscle strength.  Reducing stress and tension.  Improving overall health. How often should I exercise? Choose an activity that you enjoy, and set realistic goals. Your health care provider can help you make an activity plan that works for you. Exercise regularly as told by your health care provider. This may include:  Doing strength training two times a week, such as: ? Lifting weights. ? Using resistance bands. ? Push-ups. ? Sit-ups. ? Yoga.  Doing a certain intensity of exercise for a given amount of time. Choose from these options: ? A total of 150 minutes of moderate-intensity exercise every week. ? A total of 75 minutes of vigorous-intensity exercise every week. ? A mix of moderate-intensity and vigorous-intensity exercise every week. Children, pregnant women, people who have not exercised regularly, people who are overweight, and older adults may need to talk with a health care provider about what activities are safe to do. If you have a medical condition, be sure to talk with your health care provider before you start a new exercise program. What are some exercise ideas? Moderate-intensity exercise ideas include:  Walking 1 mile (1.6 km) in about 15 minutes.  Biking.  Hiking.  Golfing.  Dancing.   Water aerobics. Vigorous-intensity exercise ideas include:  Walking 4.5 miles (7.2 km) or more in about 1 hour.  Jogging or running 5 miles (8 km) in about 1 hour.  Biking 10 miles (16.1 km) or more in about 1 hour.  Lap swimming.  Roller-skating or in-line skating.  Cross-country skiing.  Vigorous competitive sports, such as football, basketball, and soccer.  Jumping rope.  Aerobic dancing. What are some everyday activities that can help me to get exercise?  Yard work, such as: ? Pushing a lawn mower. ? Raking and bagging leaves.  Washing your car.  Pushing a stroller.  Shoveling snow.  Gardening.  Washing windows or floors. How can I be more active in my day-to-day activities?  Use stairs instead of an elevator.  Take a walk during your lunch break.  If you drive, park your car farther away from your work or school.  If you take public transportation, get off one stop early and walk the rest of the way.  Stand up or walk around during all of your indoor phone calls.  Get up, stretch, and walk around every 30 minutes throughout the day.  Enjoy exercise with a friend. Support to continue exercising will help you keep a regular routine of activity. What guidelines can I follow while exercising?  Before you start a new exercise program, talk with your health care provider.  Do not exercise so much that you hurt yourself, feel dizzy, or get very short of breath.  Wear comfortable clothes and wear shoes with good support.  Drink plenty of water while you exercise to prevent dehydration or heat stroke.  Work out until your breathing   and your heartbeat get faster. Where to find more information  U.S. Department of Health and Human Services: ThisPath.fi  Centers for Disease Control and Prevention (CDC): FootballExhibition.com.br Summary  Exercising regularly is important. It will improve your overall fitness, flexibility, and endurance.  Regular exercise also will  improve your overall health. It can help you control your weight, reduce stress, and improve your bone density.  Do not exercise so much that you hurt yourself, feel dizzy, or get very short of breath.  Before you start a new exercise program, talk with your health care provider. This information is not intended to replace advice given to you by your health care provider. Make sure you discuss any questions you have with your health care provider. Document Released: 02/05/2010 Document Revised: 12/16/2016 Document Reviewed: 11/24/2016 Elsevier Patient Education  2020 [employer].  Exercising to Lose Weight Exercise is structured, repetitive physical activity to improve fitness and health. Getting regular exercise is important for everyone. It is especially important if you are overweight. Being overweight increases your risk of heart disease, stroke, diabetes, high blood pressure, and several types of cancer. Reducing your calorie intake and exercising can help you lose weight. Exercise is usually categorized as moderate or vigorous intensity. To lose weight, most people need to do a certain amount of moderate-intensity or vigorous-intensity exercise each week. Moderate-intensity exercise  Moderate-intensity exercise is any activity that gets you moving enough to burn at least three times more energy (calories) than if you were sitting. Examples of moderate exercise include:  Walking a mile in 15 minutes.  Doing light yard work.  Biking at an easy pace. Most people should get at least 150 minutes (2 hours and 30 minutes) a week of moderate-intensity exercise to maintain their body weight. Vigorous-intensity exercise Vigorous-intensity exercise is any activity that gets you moving enough to burn at least six times more calories than if you were sitting. When you exercise at this intensity, you should be working hard enough that you are not able to carry on a conversation. Examples of  vigorous exercise include:  Running.  Playing a team sport, such as football, basketball, and soccer.  Jumping rope. Most people should get at least 75 minutes (1 hour and 15 minutes) a week of vigorous-intensity exercise to maintain their body weight. How can exercise affect me? When you exercise enough to burn more calories than you eat, you lose weight. Exercise also reduces body fat and builds muscle. The more muscle you have, the more calories you burn. Exercise also:  Improves mood.  Reduces stress and tension.  Improves your overall fitness, flexibility, and endurance.  Increases bone strength. The amount of exercise you need to lose weight depends on:  Your age.  The type of exercise.  Any health conditions you have.  Your overall physical ability. Talk to your health care provider about how much exercise you need and what types of activities are safe for you. What actions can I take to lose weight? Nutrition   Make changes to your diet as told by your health care provider or diet and nutrition specialist (dietitian). This may include: ? Eating fewer calories. ? Eating more protein. ? Eating less unhealthy fats. ? Eating a diet that includes fresh fruits and vegetables, whole grains, low-fat dairy products, and lean protein. ? Avoiding foods with added fat, salt, and sugar.  Drink plenty of water while you exercise to prevent dehydration or heat stroke. Activity  Choose an activity that you enjoy  and set realistic goals. Your health care provider can help you make an exercise plan that works for you.  Exercise at a moderate or vigorous intensity most days of the week. ? The intensity of exercise may vary from person to person. You can tell how intense a workout is for you by paying attention to your breathing and heartbeat. Most people will notice their breathing and heartbeat get faster with more intense exercise.  Do resistance training twice each week, such  as: ? Push-ups. ? Sit-ups. ? Lifting weights. ? Using resistance bands.  Getting short amounts of exercise can be just as helpful as long structured periods of exercise. If you have trouble finding time to exercise, try to include exercise in your daily routine. ? Get up, stretch, and walk around every 30 minutes throughout the day. ? Go for a walk during your lunch break. ? Park your car farther away from your destination. ? If you take public transportation, get off one stop early and walk the rest of the way. ? Make phone calls while standing up and walking around. ? Take the stairs instead of elevators or escalators.  Wear comfortable clothes and shoes with good support.  Do not exercise so much that you hurt yourself, feel dizzy, or get very short of breath. Where to find more information  U.S. Department of Health and Human Services: BondedCompany.at  Centers for Disease Control and Prevention (CDC): http://www.wolf.info/ Contact a health care provider:  Before starting a new exercise program.  If you have questions or concerns about your weight.  If you have a medical problem that keeps you from exercising. Get help right away if you have any of the following while exercising:  Injury.  Dizziness.  Difficulty breathing or shortness of breath that does not go away when you stop exercising.  Chest pain.  Rapid heartbeat. Summary  Being overweight increases your risk of heart disease, stroke, diabetes, high blood pressure, and several types of cancer.  Losing weight happens when you burn more calories than you eat.  Reducing the amount of calories you eat in addition to getting regular moderate or vigorous exercise each week helps you lose weight. This information is not intended to replace advice given to you by your health care provider. Make sure you discuss any questions you have with your health care provider. Document Released: 02/05/2010 Document Revised: 01/16/2017  Document Reviewed: 01/16/2017 Elsevier Patient Education  2020 [employer].

## 2018-11-12 LAB — RPR: RPR Ser Ql: NONREACTIVE

## 2018-11-13 ENCOUNTER — Encounter: Payer: Self-pay | Admitting: Family Medicine

## 2018-11-16 ENCOUNTER — Ambulatory Visit (HOSPITAL_BASED_OUTPATIENT_CLINIC_OR_DEPARTMENT_OTHER)
Admission: RE | Admit: 2018-11-16 | Discharge: 2018-11-16 | Disposition: A | Discharge status: Home / Self Care | Payer: 59 | Source: Ambulatory Visit | Attending: Family Medicine | Admitting: Family Medicine

## 2018-11-16 ENCOUNTER — Other Ambulatory Visit: Payer: Self-pay

## 2018-11-16 DIAGNOSIS — Z0000 Encounter for general adult medical examination without abnormal findings: Secondary | ICD-10-CM

## 2018-11-16 DIAGNOSIS — E010 Iodine-deficiency related diffuse (endemic) goiter: Secondary | ICD-10-CM | POA: Insufficient documentation

## 2018-11-16 IMAGING — US US THYROID
1 series · 13 of 25 positions shown · non-contrast
Comparison: [DATE]

CLINICAL DATA: Hypothyroid, thyromegaly

EXAM:
THYROID ULTRASOUND
TECHNIQUE: Ultrasound examination of the thyroid gland and adjacent soft
tissues was performed.

[Series 1: us thyroid · 13 of 108 slices shown]
[im 1/108]
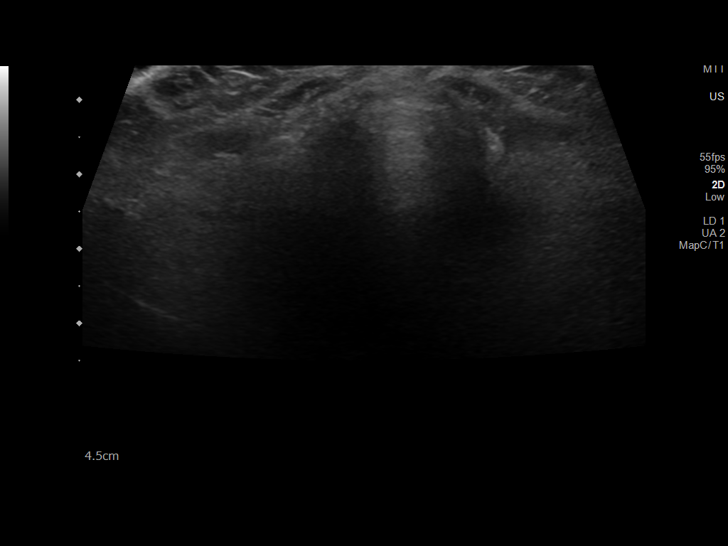
[im 9/108]
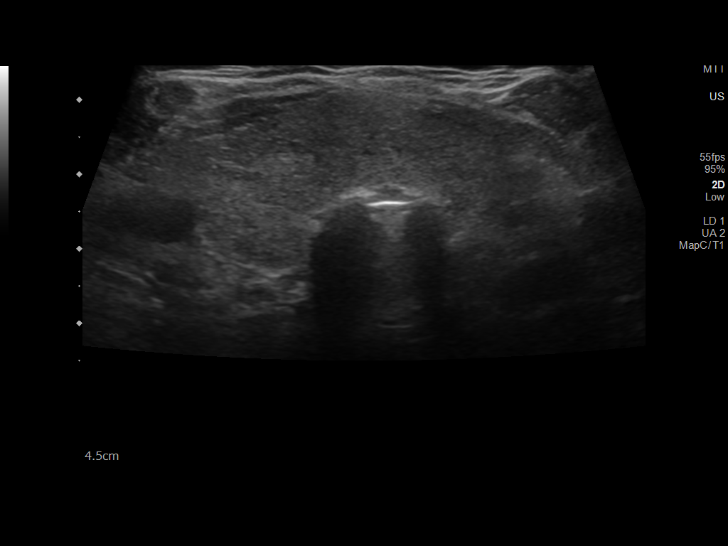
[im 18/108]
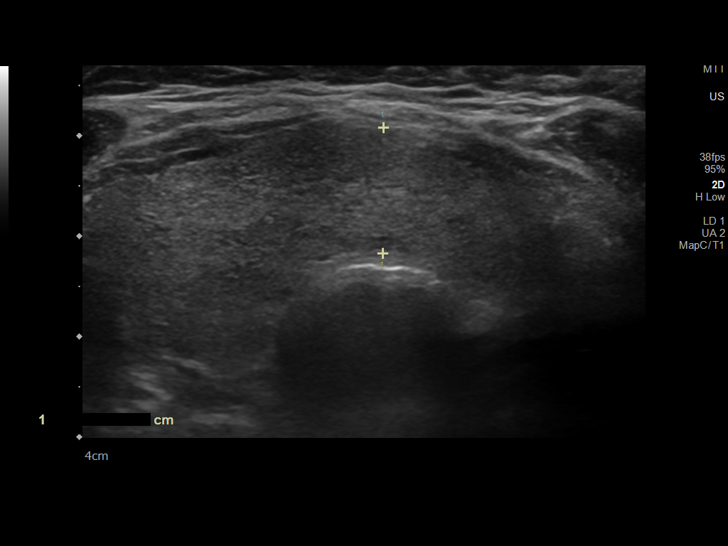
[im 27/108]
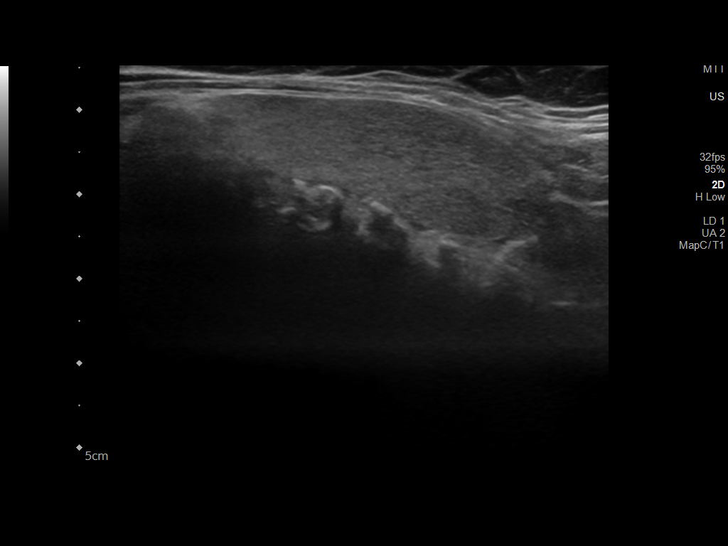
[im 36/108]
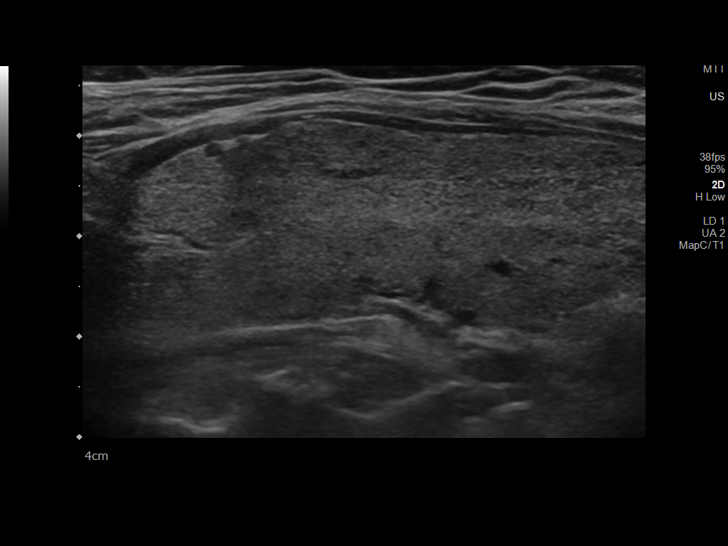
[im 45/108]
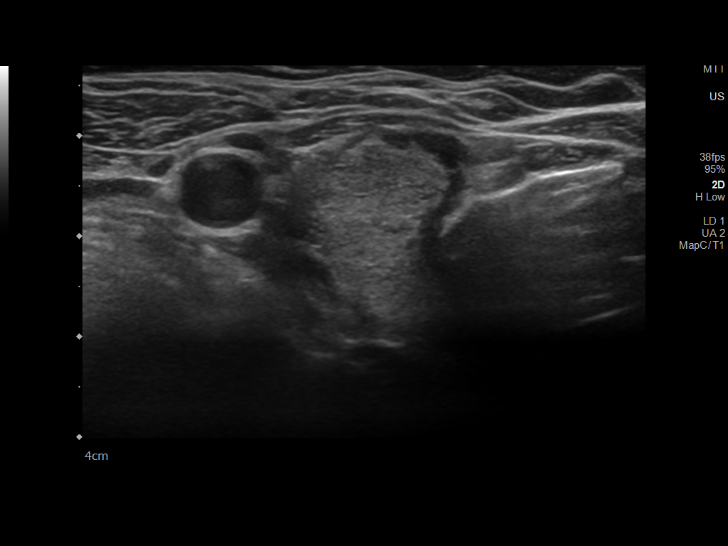
[im 54/108]
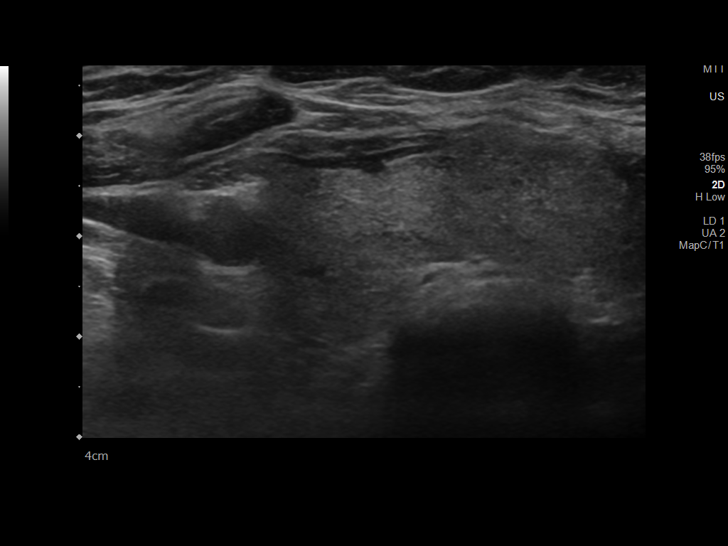
[im 63/108]
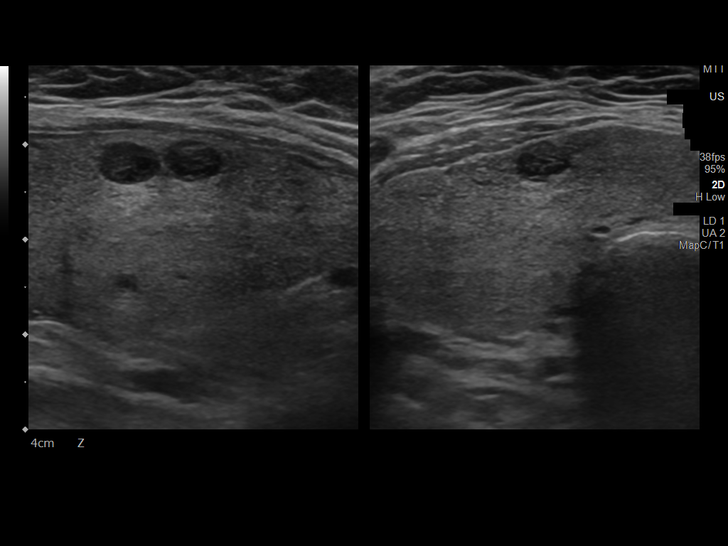
[im 72/108]
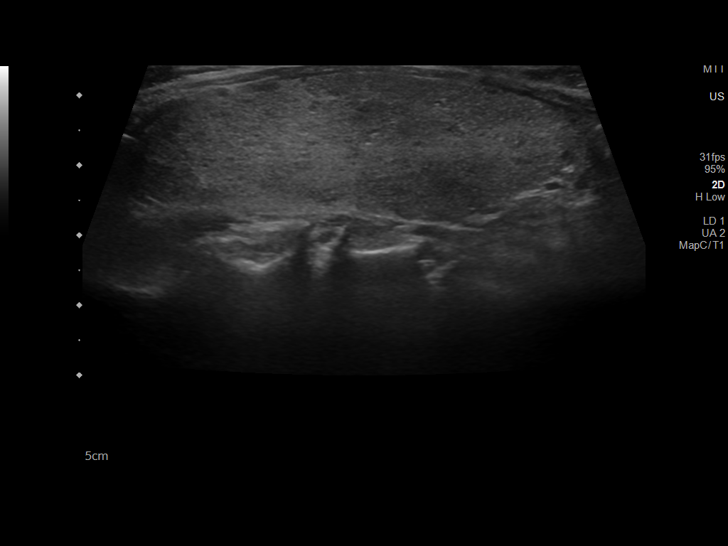
[im 81/108]
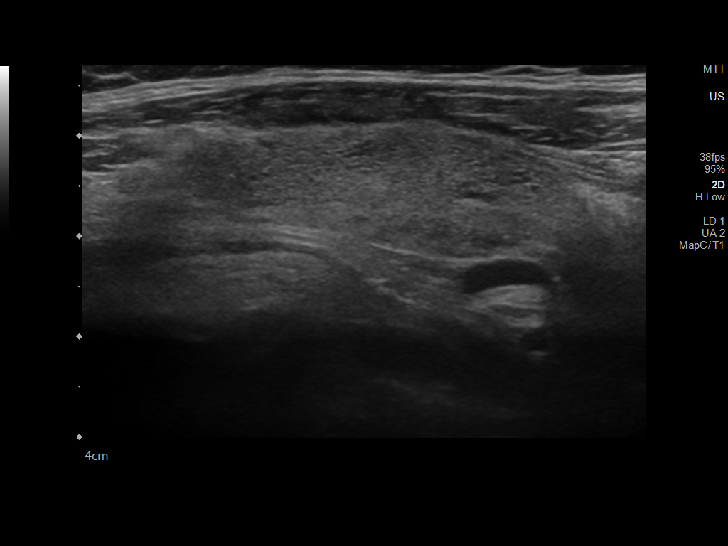
[im 90/108]
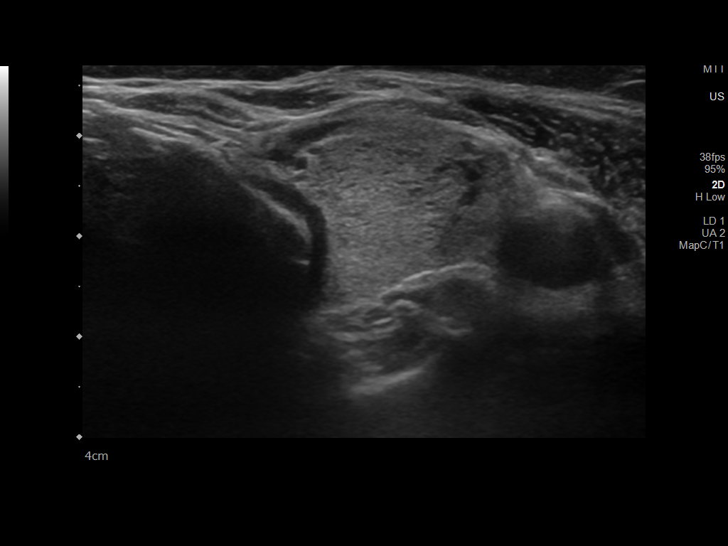
[im 99/108]
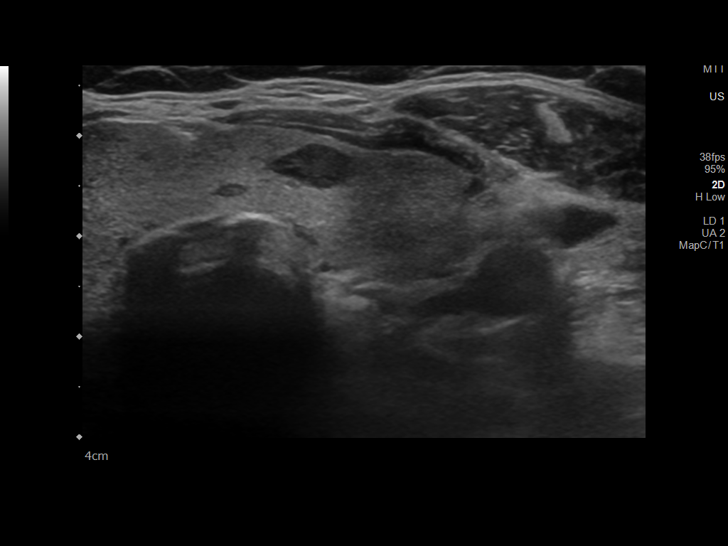
[im 108/108]
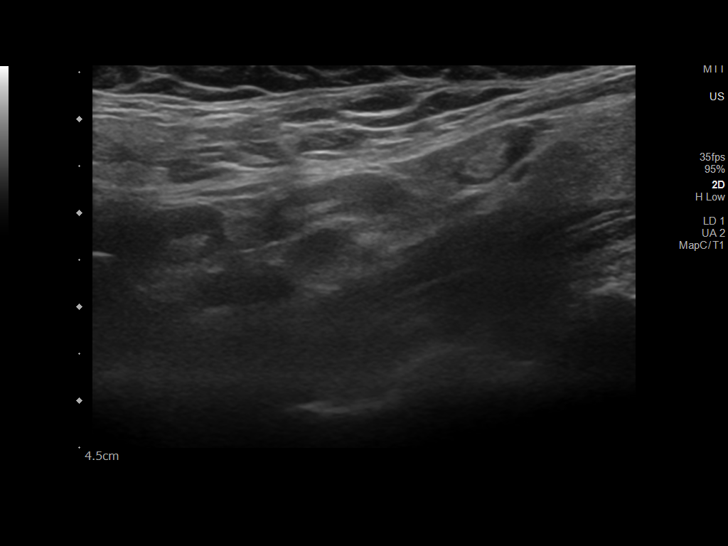

[13 of 25 positions shown; findings below may reference images not displayed]

FINDINGS: Parenchymal Echotexture: Mildly heterogenous

Isthmus: 1.3 cm

Right lobe: 7.2 x 2.0 x 2.9 cm

Left lobe: 7.2 x 2.1 x 3.0 cm

_________________________________________________________

Estimated total number of nodules >/= 1 cm: 1

Number of spongiform nodules >/=  2 cm not described below (TR1): 0

Number of mixed cystic and solid nodules >/= 1.5 cm not described
below (TR2): 0

_________________________________________________________

Nodule # 5:

Location: Left; Inferior

Maximum size: 1.0 cm; Other 2 dimensions: 0.5 x 0.9 cm

Composition: solid/almost completely solid (2)

Echogenicity: hypoechoic (2)

Shape: not taller-than-wide (0)

Margins: smooth (0)

Echogenic foci: none (0)

ACR TI-RADS total points: 4.

ACR TI-RADS risk category: TR4 (4-6 points).

ACR TI-RADS recommendations:

*Given size (>/= 1 - 1.4 cm) and appearance, a follow-up ultrasound
in 1 year should be considered based on TI-RADS criteria.

_________________________________________________________

There are a few scattered additional subcentimeter hypoechoic
nodules noted all measuring 7 mm or less in size. These would not
meet criteria for any biopsy or follow-up. Largest nodules detailed
above.

Mild diffuse gland enlargement without hypervascularity. No
adenopathy. Benign cervical lymph nodes noted.
IMPRESSION: 1 cm left inferior TR 4 nodule meets criteria for follow-up in 1
year.

Nonspecific mild thyroid enlargement and heterogeneity with
additional subcentimeter nodules present.

The above is in keeping with the ACR TI-RADS recommendations - [HOSPITAL] [XC];[DATE].

## 2018-11-19 MED ORDER — ROSUVASTATIN CALCIUM 20 MG PO TABS: 20.0000 mg | ORAL_TABLET | Freq: Every day | ORAL | 1 refills | Status: DC

## 2018-11-19 NOTE — Addendum Note (Signed)
Addended by: Jon Billings on: 11/19/2018 01:38 PM   Modules accepted: Orders

## 2018-11-20 DIAGNOSIS — E041 Nontoxic single thyroid nodule: Secondary | ICD-10-CM | POA: Insufficient documentation

## 2018-11-20 NOTE — Addendum Note (Signed)
Addended by: Jon Billings on: 11/20/2018 08:07 AM   Modules accepted: Orders

## 2018-12-17 ENCOUNTER — Encounter: Payer: 59 | Admitting: Gastroenterology

## 2018-12-19 ENCOUNTER — Encounter: Payer: 59 | Admitting: Gastroenterology

## 2019-05-28 ENCOUNTER — Other Ambulatory Visit: Payer: Self-pay

## 2019-05-29 ENCOUNTER — Encounter: Payer: Self-pay | Admitting: Family Medicine

## 2019-05-29 ENCOUNTER — Ambulatory Visit (INDEPENDENT_AMBULATORY_CARE_PROVIDER_SITE_OTHER): Payer: 59 | Admitting: Family Medicine

## 2019-05-29 VITALS — BP 114/72 | HR 93 | Temp 97.5°F | Ht 68.0 in | Wt 177.2 lb

## 2019-05-29 DIAGNOSIS — E042 Nontoxic multinodular goiter: Secondary | ICD-10-CM

## 2019-05-29 DIAGNOSIS — L403 Pustulosis palmaris et plantaris: Secondary | ICD-10-CM

## 2019-05-29 DIAGNOSIS — Z23 Encounter for immunization: Secondary | ICD-10-CM

## 2019-05-29 DIAGNOSIS — Z0000 Encounter for general adult medical examination without abnormal findings: Secondary | ICD-10-CM | POA: Diagnosis not present

## 2019-05-29 DIAGNOSIS — E7800 Pure hypercholesterolemia, unspecified: Secondary | ICD-10-CM

## 2019-05-29 LAB — COMPREHENSIVE METABOLIC PANEL
ALT: 14 U/L (ref 0–35)
AST: 14 U/L (ref 0–37)
Albumin: 3.7 g/dL (ref 3.5–5.2)
Alkaline Phosphatase: 151 U/L — ABNORMAL HIGH (ref 39–117)
BUN: 12 mg/dL (ref 6–23)
CO2: 29 mEq/L (ref 19–32)
Calcium: 9.5 mg/dL (ref 8.4–10.5)
Chloride: 103 mEq/L (ref 96–112)
Creatinine, Ser: 0.7 mg/dL (ref 0.40–1.20)
GFR: 84.67 mL/min (ref >60.00)
Glucose, Bld: 107 mg/dL — ABNORMAL HIGH (ref 70–99)
Potassium: 5.1 mEq/L (ref 3.5–5.1)
Sodium: 141 mEq/L (ref 135–145)
Total Bilirubin: 0.3 mg/dL (ref 0.2–1.2)
Total Protein: 7.1 g/dL (ref 6.0–8.3)

## 2019-05-29 LAB — URINALYSIS, ROUTINE W REFLEX MICROSCOPIC
Bilirubin Urine: NEGATIVE
Hgb urine dipstick: NEGATIVE
Ketones, ur: NEGATIVE
Leukocytes,Ua: NEGATIVE
Nitrite: NEGATIVE
RBC / HPF: NONE SEEN (ref 0–?)
Specific Gravity, Urine: 1.02 (ref 1.000–1.030)
Total Protein, Urine: NEGATIVE
Urine Glucose: NEGATIVE
Urobilinogen, UA: 0.2 (ref 0.0–1.0)
pH: 7 (ref 5.0–8.0)

## 2019-05-29 LAB — LIPID PANEL
Cholesterol: 228 mg/dL — ABNORMAL HIGH (ref 0–200)
HDL: 42.7 mg/dL (ref >39.00)
LDL Cholesterol: 161 mg/dL — ABNORMAL HIGH (ref 0–99)
NonHDL: 185.7
Total CHOL/HDL Ratio: 5
Triglycerides: 122 mg/dL (ref 0.0–149.0)
VLDL: 24.4 mg/dL (ref 0.0–40.0)

## 2019-05-29 LAB — LDL CHOLESTEROL, DIRECT: Direct LDL: 161 mg/dL

## 2019-05-29 NOTE — Progress Notes (Addendum)
Established Patient Office Visit  Subjective:  Patient ID: Ann Newton, female    DOB: [birth year]  Age: 64 y.o. MRN: [id number]  CC:  Chief Complaint  Patient presents with  . Follow-up    6 month follow up on blood work, pt would like 2nd shingles vaccine    HPI Ann Newton presents for follow-up of her elevated LDL cholesterol.  Patient has not taken her Crestor in over 6 months.  Patient has been able to lose some weight and has adjusted her diet.  She felt as though it may have caused some nausea.  She also would like to have her second shingles vaccine.  She has not had the Covid vaccine.  Past Medical History:  Diagnosis Date  . Arthritis   . Hyperlipidemia    diet controlled  . Psoriasis   . Thyroid disease   . Wears contact lenses     Past Surgical History:  Procedure Laterality Date  . ABDOMINAL HYSTERECTOMY  2000   part  . ANTERIOR AND POSTERIOR REPAIR N/A 09/08/2016   Procedure: ANTERIOR (CYSTOCELE) AND POSTERIOR REPAIR (RECTOCELE)/Perineorrhaphy;  Surgeon: Brien Few, MD;  Location: [facility];  Service: Gynecology;  Laterality: N/A;  Requests 52min. Repair of enterocele/posterior  . COLONOSCOPY     ~12 yrs ago- normal per pt   . COSMETIC SURGERY  1999   breast implants  . HAGLAND'S DEFORMITY EXCISION Left 09/13/2013   Procedure: LEFT FOOT: EXCISION INTERDIGITAL MORTON'S NEUROMA SINGLE EACH;  Surgeon: Yvette Rack., MD;  Location: [facility];  Service: Orthopedics;  Laterality: Left;  . KNEE ARTHROSCOPY  2013,2011   right/left  . SHOULDER ARTHROSCOPY     rt  . TUBAL LIGATION    . WISDOM TOOTH EXTRACTION      Family History  Problem Relation Age of Onset  . Kidney disease Mother   . Diabetes Mother   . Heart attack Father   . Rheum arthritis Brother   . Healthy Daughter   . Healthy Son   . Heart attack Maternal Grandmother   . Heart attack Maternal Grandfather   . Cancer Paternal Grandmother   . Heart attack Paternal  Grandfather   . Rheum arthritis Brother   . Colon cancer Neg Hx   . Colon polyps Neg Hx   . Esophageal cancer Neg Hx   . Rectal cancer Neg Hx   . Stomach cancer Neg Hx     Social History   Socioeconomic History  . Marital status: Married    Spouse name: Not on file  . Number of children: Not on file  . Years of education: Not on file  . Highest education level: Not on file  Occupational History  . Not on file  Tobacco Use  . Smoking status: Former Smoker    Packs/day: 0.50    Years: 10.00    Pack years: 5.00    Types: Cigarettes    Quit date: 09/12/2012    Years since quitting: 7.1  . Smokeless tobacco: Never Used  Vaping Use  . Vaping Use: Never used  Substance and Sexual Activity  . Alcohol use: No  . Drug use: No  . Sexual activity: Yes    Birth control/protection: Surgical  Other Topics Concern  . Not on file  Social History Narrative  . Not on file   Social Determinants of Health   Financial Resource Strain:   . Difficulty of Paying Living Expenses: Not on file  Food Insecurity:   .  Worried About Programme researcher, broadcasting/film/video in the Last Year: Not on file  . Ran Out of Food in the Last Year: Not on file  Transportation Needs:   . Lack of Transportation (Medical): Not on file  . Lack of Transportation (Non-Medical): Not on file  Physical Activity:   . Days of Exercise per Week: Not on file  . Minutes of Exercise per Session: Not on file  Stress:   . Feeling of Stress : Not on file  Social Connections:   . Frequency of Communication with Friends and Family: Not on file  . Frequency of Social Gatherings with Friends and Family: Not on file  . Attends Religious Services: Not on file  . Active Member of Clubs or Organizations: Not on file  . Attends Banker Meetings: Not on file  . Marital Status: Not on file  Intimate Partner Violence:   . Fear of Current or Ex-Partner: Not on file  . Emotionally Abused: Not on file  . Physically Abused: Not on file   . Sexually Abused: Not on file    Outpatient Medications Prior to Visit  Medication Sig Dispense Refill  . Apremilast (OTEZLA) 30 MG TABS Take 30 mg by mouth 2 (two) times daily.     . clobetasol ointment (TEMOVATE) 0.05 %     . diclofenac sodium (VOLTAREN) 1 % GEL 3 grams to 3 large joints up to 3 times daily 3 Tube 3  . Multiple Vitamins-Minerals (CENTRUM SILVER 50+WOMEN) TABS Take 1 tablet by mouth daily.    . Biotin 26378 MCG TABS Take 1 tablet by mouth daily.    Marland Kitchen acyclovir (ZOVIRAX) 400 MG tablet Take 400 mg by mouth daily.    . meloxicam (MOBIC) 15 MG tablet Take 15 mg by mouth as needed.     . metroNIDAZOLE (METROGEL) 0.75 % gel Apply 1 application topically as needed.     . rosuvastatin (CRESTOR) 20 MG tablet Take 1 tablet (20 mg total) by mouth daily. (Patient not taking: Reported on 05/29/2019) 90 tablet 1   No facility-administered medications prior to visit.    No Known Allergies  ROS Review of Systems  Constitutional: Negative.   HENT: Negative.   Eyes: Negative for photophobia and visual disturbance.  Respiratory: Negative.   Cardiovascular: Negative.   Gastrointestinal: Negative.   Genitourinary: Negative.   Musculoskeletal: Negative for gait problem and joint swelling.  Skin: Positive for rash.  Allergic/Immunologic: Negative for immunocompromised state.  Neurological: Negative for tremors and speech difficulty.  Hematological: Does not bruise/bleed easily.  Psychiatric/Behavioral: Negative.    Depression screen [facility] 2/9 05/29/2019 05/29/2019 09/15/2015  Decreased Interest 0 0 0  Down, Depressed, Hopeless 0 0 0  PHQ - 2 Score 0 0 0  Altered sleeping 0 - -  Tired, decreased energy 0 - -  Change in appetite 0 - -  Feeling bad or failure about yourself  0 - -  Trouble concentrating 0 - -  Moving slowly or fidgety/restless 0 - -  Suicidal thoughts 0 - -  PHQ-9 Score 0 - -  Difficult doing work/chores Not difficult at all - -      Objective:    Physical  Exam  Constitutional: She is oriented to person, place, and time. She appears well-developed and well-nourished. No distress.  HENT:  Head: Normocephalic and atraumatic.  Right Ear: External ear normal.  Left Ear: External ear normal.  Eyes: Conjunctivae are normal. Right eye exhibits no discharge. Left eye exhibits  no discharge. No scleral icterus.  Neck: No JVD present. No tracheal deviation present. No thyromegaly present.  Cardiovascular: Normal rate, regular rhythm and normal heart sounds.  Pulses:      Carotid pulses are 2+ on the right side and 2+ on the left side.      Radial pulses are 2+ on the right side and 2+ on the left side.  No carotid bruit noted.   Pulmonary/Chest: Effort normal and breath sounds normal. No stridor.  Abdominal: Bowel sounds are normal.  Musculoskeletal:        General: No edema.  Lymphadenopathy:    She has no cervical adenopathy.  Neurological: She is alert and oriented to person, place, and time.  Skin: Skin is warm and dry. She is not diaphoretic.  Psychiatric: She has a normal mood and affect.    BP 114/72   Pulse 93   Temp (!) 97.5 F (36.4 C) (Tympanic)   Ht 5\' 8"  (1.727 m)   Wt 177 lb 3.2 oz (80.4 kg)   SpO2 98%   BMI 26.94 kg/m  Wt Readings from Last 3 Encounters:  11/11/19 187 lb (84.8 kg)  10/28/19 187 lb (84.8 kg)  05/29/19 177 lb 3.2 oz (80.4 kg)    The 10-year ASCVD risk score 07/29/19 DC Jr., et al., 2013) is: 4.8%   Values used to calculate the score:     Age: 30 years     Sex: Female     Is Non-Hispanic African American: No     Diabetic: No     Tobacco smoker: No     Systolic Blood Pressure: 124 mmHg     Is BP treated: No     HDL Cholesterol: 42.7 mg/dL     Total Cholesterol: 228 mg/dL Health Maintenance Due  Topic Date Due  . COVID-19 Vaccine (1) Never done  . HIV Screening  Never done  . INFLUENZA VACCINE  08/18/2019  . PAP SMEAR-Modifier  01/18/2020    There are no preventive care reminders to display for  this patient.  Lab Results  Component Value Date   TSH 0.65 11/09/2018   Lab Results  Component Value Date   WBC 7.2 11/09/2018   HGB 13.3 11/09/2018   HCT 39.5 11/09/2018   MCV 91.6 11/09/2018   PLT 398.0 11/09/2018   Lab Results  Component Value Date   NA 141 05/29/2019   K 5.1 05/29/2019   CO2 29 05/29/2019   GLUCOSE 107 (H) 05/29/2019   BUN 12 05/29/2019   CREATININE 0.70 05/29/2019   BILITOT 0.3 05/29/2019   ALKPHOS 151 (H) 05/29/2019   AST 14 05/29/2019   ALT 14 05/29/2019   PROT 7.1 05/29/2019   ALBUMIN 3.7 05/29/2019   CALCIUM 9.5 05/29/2019   ANIONGAP 8 09/02/2016   GFR 84.67 05/29/2019   Lab Results  Component Value Date   CHOL 228 (H) 05/29/2019   Lab Results  Component Value Date   HDL 42.70 05/29/2019   Lab Results  Component Value Date   LDLCALC 161 (H) 05/29/2019   Lab Results  Component Value Date   TRIG 122.0 05/29/2019   Lab Results  Component Value Date   CHOLHDL 5 05/29/2019   Lab Results  Component Value Date   HGBA1C 5.8 11/09/2018      Assessment & Plan:   Problem List Items Addressed This Visit      Musculoskeletal and Integument   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)  Other   Elevated LDL cholesterol level - Primary (Chronic)   Relevant Orders   Comprehensive metabolic panel (Completed)   LDL cholesterol, direct (Completed)   Lipid panel (Completed)   Healthcare maintenance   Relevant Orders   Urinalysis, Routine w reflex microscopic (Completed)   Ambulatory referral to Gastroenterology    Other Visit Diagnoses    Need for Tdap vaccination       Relevant Orders   Tdap vaccine greater than or equal to 7yo IM (Completed)   Need for zoster vaccine       Relevant Orders   Varicella-zoster vaccine IM (Shingrix) (Completed)   Multinodular goiter       Relevant Orders   US THYROID      Meds ordered this encounter  Medications  . DISCONTD: atorvastatin (LIPITOR) 20 MG tablet    Sig: Take 1  tablet (20 mg total) by mouth daily.    Dispense:  90 tablet    Refill:  3    Follow-up: Return in about 6 months (around 11/29/2019).  Even with her low 10-year risk score I strongly advised her to consider taking a statin.  Explained that the risk score does not take into account LDL cholesterol.  Asked her to go for consultation for colonoscopy as well. Advised Covid vaccine as well.   Mliss Sax, MD

## 2019-05-29 NOTE — Patient Instructions (Signed)
Familial Hypercholesterolemia Familial hypercholesterolemia (FH) is a genetic disorder that causes a very high level of LDL (low-density lipoprotein) cholesterol. Cholesterol is a waxy, fat-like substance that your body needs to build cells. Your body makes all the cholesterol it needs in the liver and removes extra (excess) cholesterol from the blood as needed. Excess cholesterol comes from food that you eat. In people who have FH, the body is not able to remove LDL cholesterol from the blood as it should. A high level of LDL cholesterol puts you at higher risk for narrowing and hardening of your arteries (atherosclerosis) at an early age. This raises your risk for heart disease and stroke. What are the causes? FH is passed from parent to child (inherited). FH is caused by an inherited gene defect (genetic mutation) that makes it hard for the liver to remove LDL cholesterol from the blood. The gene may be inherited from one parent or both parents. What increases the risk? You may be at higher risk for FH if:  You have a family history of the condition. If both parents carry the genetic mutation, their children are at higher risk for a more severe form of FH, with symptoms that start at an earlier age. What are the signs or symptoms? You may have a high level of LDL cholesterol before you develop symptoms. Symptoms of FH may include:  Cholesterol nodules (xanthomas) on the cords of tissue that connect muscles to bones (tendons). Xanthomas often form on the long tendon at the back of the ankle (Achilles tendon) or on the tendons on the back of the hands.  Cholesterol deposits (xanthelasmas) under the skin of the eyelids.  A gray or blue ring around the white part of the eye (corneal arcus). Complications of FH can occur due to atherosclerosis. Atherosclerosis may cause damage to an area of the body that is not getting enough blood. Complications of FH may include:  Chest pain (angina) and shortness  of breath due to narrowed or blocked arteries in the heart (coronary artery disease).  Pain and cramping in the back of the lower legs (calves) when walking (claudication).  Interruption in blood flow to the brain (stroke). This may cause: ? Loss of balance. ? Vision loss. ? Sudden weakness or numbness on one side of the body. How is this diagnosed? This condition may be diagnosed based on:  Your symptoms.  Your medical history, including any family history of FH or early coronary heart disease.  A physical exam.  A blood test to check for the genetic mutation that causes FH. Your family members may also be tested. How is this treated? There is no cure for FH, but treatment can lower LDL cholesterol levels and lower your risk for heart attack or stroke. Treatment should be started as soon as you are diagnosed. Treatment may include:  A type of medicine that lowers your cholesterol (statin). If statins do not help, your health care provider may try other kinds of cholesterol-lowering medicines. The exact combination of medicines depends on the severity of your symptoms.  A procedure to filter LDL from your blood (apheresis). You may need this treatment if you have a severe form of FH.  Making lifestyle changes that are healthy for your heart, such as lowering the amount of fat and cholesterol in your diet. Follow these instructions at home: Lifestyle   Lose weight, if directed by your health care provider.  Follow instructions from your health care provider about eating a healthy diet.  Your health care provider may recommend: ? Working with a diet and nutrition specialist (dietitian), who can help you make a healthy eating plan and help you maintain a healthy weight. ? Eating less fat and cholesterol. Avoid fatty meats, fried foods, and whole-fat dairy. ? Eating more vegetables, fruits, and whole grains. ? Limiting your intake of alcohol.  Be physically active. Ask your health  care provider what type of exercise is best for you.  Do not use any products that contain nicotine or tobacco, such as cigarettes and e-cigarettes. If you need help quitting, ask your health care provider.  Work with your health care provider to manage any other conditions you have, such as high blood pressure (hypertension) or diabetes. These conditions affect your heart. General instructions  Take over-the-counter and prescription medicines only as told by your health care provider.  Keep all follow-up visits as told by your health care provider. This is important. Contact a health care provider if:  You have pain or cramps in your calf when you walk. Get help right away if:   You have sudden, unexplained discomfort in your chest, arms, back, neck, jaw, or upper body.  You have trouble breathing.  You have a sudden, severe headache with no known cause.  You have any symptoms of a stroke. "BE FAST" is an easy way to remember the main warning signs of a stroke: ? B - Balance. Signs are dizziness, sudden trouble walking, or loss of balance. ? E - Eyes. Signs are trouble seeing or a sudden change in vision. ? F - Face. Signs are sudden weakness or numbness of the face, or the face or eyelid drooping on one side. ? A - Arms. Signs are weakness or numbness in an arm. This happens suddenly and usually on one side of the body. ? S - Speech. Signs are sudden trouble speaking, slurred speech, or trouble understanding what people say. ? T - Time. Time to call emergency services. Write down what time symptoms started. These symptoms may represent a serious problem that is an emergency. Do not wait to see if the symptoms will go away. Get medical help right away. Call your local emergency services (911 in the [place] Do not drive yourself to the hospital. Summary  Familial hypercholesterolemia (FH) is a genetic disorder that causes a very high level of LDL (low-density lipoprotein)  cholesterol.  FH increases your risk for coronary heart disease and stroke at an early age.  Treatment for FH should be started as soon as you are diagnosed with the condition. Treatment is aimed at lowering your risk for complications.  Follow instructions from your health care provider about eating a healthy diet. Your health care provider may recommend eating less fat and cholesterol. This information is not intended to replace advice given to you by your health care provider. Make sure you discuss any questions you have with your health care provider. Document Revised: 12/16/2016 Document Reviewed: 06/16/2016 Elsevier Patient Education  2020 [employer] Inc[employer]  High Cholesterol  High cholesterol is a condition in which the blood has high levels of a white, waxy, fat-like substance (cholesterol). The human body needs small amounts of cholesterol. The liver makes all the cholesterol that the body needs. Extra (excess) cholesterol comes from the food that we eat. Cholesterol is carried from the liver by the blood through the blood vessels. If you have high cholesterol, deposits (plaques) may build up on the walls of your blood vessels (arteries). Plaques make the  arteries narrower and stiffer. Cholesterol plaques increase your risk for heart attack and stroke. Work with your health care provider to keep your cholesterol levels in a healthy range. What increases the risk? This condition is more likely to develop in people who:  Eat foods that are high in animal fat (saturated fat) or cholesterol.  Are overweight.  Are not getting enough exercise.  Have a family history of high cholesterol. What are the signs or symptoms? There are no symptoms of this condition. How is this diagnosed? This condition may be diagnosed from the results of a blood test.  If you are older than age 82, your health care provider may check your cholesterol every 4-6 years.  You may be checked more often if you  already have high cholesterol or other risk factors for heart disease. The blood test for cholesterol measures:  "Bad" cholesterol (LDL cholesterol). This is the main type of cholesterol that causes heart disease. The desired level for LDL is less than 100.  "Good" cholesterol (HDL cholesterol). This type helps to protect against heart disease by cleaning the arteries and carrying the LDL away. The desired level for HDL is 60 or higher.  Triglycerides. These are fats that the body can store or burn for energy. The desired number for triglycerides is lower than 150.  Total cholesterol. This is a measure of the total amount of cholesterol in your blood, including LDL cholesterol, HDL cholesterol, and triglycerides. A healthy number is less than 200. How is this treated? This condition is treated with diet changes, lifestyle changes, and medicines. Diet changes  This may include eating more whole grains, fruits, vegetables, nuts, and fish.  This may also include cutting back on red meat and foods that have a lot of added sugar. Lifestyle changes  Changes may include getting at least 40 minutes of aerobic exercise 3 times a week. Aerobic exercises include walking, biking, and swimming. Aerobic exercise along with a healthy diet can help you maintain a healthy weight.  Changes may also include quitting smoking. Medicines  Medicines are usually given if diet and lifestyle changes have failed to reduce your cholesterol to healthy levels.  Your health care provider may prescribe a statin medicine. Statin medicines have been shown to reduce cholesterol, which can reduce the risk of heart disease. Follow these instructions at home: Eating and drinking If told by your health care provider:  Eat chicken (without skin), fish, veal, shellfish, ground [place] breast, and round or loin cuts of red meat.  Do not eat fried foods or fatty meats, such as hot dogs and salami.  Eat plenty of fruits, such  as apples.  Eat plenty of vegetables, such as broccoli, potatoes, and carrots.  Eat beans, peas, and lentils.  Eat grains such as barley, rice, couscous, and bulgur wheat.  Eat pasta without cream sauces.  Use skim or nonfat milk, and eat low-fat or nonfat yogurt and cheeses.  Do not eat or drink whole milk, cream, ice cream, egg yolks, or hard cheeses.  Do not eat stick margarine or tub margarines that contain trans fats (also called partially hydrogenated oils).  Do not eat saturated tropical oils, such as coconut oil and palm oil.  Do not eat cakes, cookies, crackers, or other baked goods that contain trans fats.  General instructions  Exercise as directed by your health care provider. Increase your activity level with activities such as gardening, walking, and taking the stairs.  Take over-the-counter and prescription medicines only  as told by your health care provider.  Do not use any products that contain nicotine or tobacco, such as cigarettes and e-cigarettes. If you need help quitting, ask your health care provider.  Keep all follow-up visits as told by your health care provider. This is important. Contact a health care provider if:  You are struggling to maintain a healthy diet or weight.  You need help to start on an exercise program.  You need help to stop smoking. Get help right away if:  You have chest pain.  You have trouble breathing. This information is not intended to replace advice given to you by your health care provider. Make sure you discuss any questions you have with your health care provider. Document Revised: 01/06/2017 Document Reviewed: 07/04/2015 Elsevier Patient Education  2020 [employer].

## 2019-05-30 MED ORDER — ATORVASTATIN CALCIUM 20 MG PO TABS: 20.0000 mg | ORAL_TABLET | Freq: Every day | ORAL | 3 refills | Status: DC

## 2019-05-30 NOTE — Addendum Note (Signed)
Addended by: Andrez Grime on: 05/30/2019 09:37 AM   Modules accepted: Orders

## 2019-08-06 ENCOUNTER — Encounter: Payer: Self-pay | Admitting: Family Medicine

## 2019-09-09 ENCOUNTER — Telehealth: Payer: Self-pay | Admitting: Family Medicine

## 2019-09-09 ENCOUNTER — Encounter: Payer: Self-pay | Admitting: Gastroenterology

## 2019-09-09 DIAGNOSIS — E7800 Pure hypercholesterolemia, unspecified: Secondary | ICD-10-CM

## 2019-09-09 NOTE — Telephone Encounter (Signed)
Patient would like to change back to Crestor from Lipitor because she has less side effects with the Crestor.

## 2019-09-10 MED ORDER — ROSUVASTATIN CALCIUM 20 MG PO TABS: 20.0000 mg | ORAL_TABLET | Freq: Every day | ORAL | 1 refills | Status: DC

## 2019-09-10 NOTE — Telephone Encounter (Signed)
Please advise message below  °

## 2019-09-10 NOTE — Telephone Encounter (Signed)
Patient aware of message below.

## 2019-10-28 ENCOUNTER — Encounter: Payer: Self-pay | Admitting: Gastroenterology

## 2019-10-28 ENCOUNTER — Ambulatory Visit (AMBULATORY_SURGERY_CENTER): Payer: Self-pay | Admitting: *Deleted

## 2019-10-28 ENCOUNTER — Other Ambulatory Visit: Payer: Self-pay

## 2019-10-28 VITALS — Ht 68.0 in | Wt 187.0 lb

## 2019-10-28 DIAGNOSIS — Z1211 Encounter for screening for malignant neoplasm of colon: Secondary | ICD-10-CM

## 2019-10-28 MED ORDER — SUTAB 1479-225-188 MG PO TABS: 24.0000 | ORAL_TABLET | ORAL | 0 refills | Status: DC

## 2019-10-28 NOTE — Progress Notes (Signed)
No egg or soy allergy known to patient  No issues with past sedation with any surgeries or procedures no intubation problems in the past  No FH of Malignant Hyperthermia No diet pills per patient No home 02 use per patient  No blood thinners per patient  Pt states  issues with constipation - she does have a BM daily - she states hard to feel completely clean out with a BM - has used OTC Dulcolax  No A fib or A flutter  EMMI video to pt or via MyChart  COVID 19 guidelines implemented in [facility] today with Pt and RN   [employer] given to pt in [facility] today , Code to Pharmacy   Due to the COVID-19 pandemic we are asking patients to follow these guidelines. Please only bring one care partner. Please be aware that your care partner may wait in the car in the parking lot or if they feel like they will be too hot to wait in the car, they may wait in the lobby on the 4th floor. All care partners are required to wear a mask the entire time (we do not have any that we can provide them), they need to practice social distancing, and we will do a Covid check for all patient's and care partners when you arrive. Also we will check their temperature and your temperature. If the care partner waits in their car they need to stay in the parking lot the entire time and we will call them on their cell phone when the patient is ready for discharge so they can bring the car to the front of the building. Also all patient's will need to wear a mask into building.

## 2019-11-11 ENCOUNTER — Ambulatory Visit (AMBULATORY_SURGERY_CENTER): Payer: 59 | Admitting: Gastroenterology

## 2019-11-11 ENCOUNTER — Encounter: Payer: Self-pay | Admitting: Gastroenterology

## 2019-11-11 ENCOUNTER — Other Ambulatory Visit: Payer: Self-pay

## 2019-11-11 VITALS — BP 124/62 | HR 78 | Temp 97.3°F | Resp 12 | Ht 68.0 in | Wt 187.0 lb

## 2019-11-11 DIAGNOSIS — Z1211 Encounter for screening for malignant neoplasm of colon: Secondary | ICD-10-CM

## 2019-11-11 MED ORDER — SODIUM CHLORIDE 0.9 % IV SOLN
500.0000 mL | INTRAVENOUS | Status: DC
Start: 2019-11-11 — End: 2019-11-11

## 2019-11-11 NOTE — Op Note (Signed)
[facility] Patient Name: Ann Newton Procedure Date: 11/11/2019 11:07 AM MRN: [id number] Endoscopist: Lynann Bologna , MD Age: 64 Referring MD:  Date of Birth: 28-Mar-1955 Gender: Female Account #: [id number] Procedure:                Colonoscopy Indications:              Screening for colorectal malignant neoplasm Medicines:                Monitored Anesthesia Care Procedure:                Pre-Anesthesia Assessment:                           - Prior to the procedure, a History and Physical                            was performed, and patient medications and                            allergies were reviewed. The patient's tolerance of                            previous anesthesia was also reviewed. The risks                            and benefits of the procedure and the sedation                            options and risks were discussed with the patient.                            All questions were answered, and informed consent                            was obtained. Prior Anticoagulants: The patient has                            taken no previous anticoagulant or antiplatelet                            agents. ASA Grade Assessment: II - A patient with                            mild systemic disease. After reviewing the risks                            and benefits, the patient was deemed in                            satisfactory condition to undergo the procedure.                           After obtaining informed consent, the colonoscope  was passed under direct vision. Throughout the                            procedure, the patient's blood pressure, pulse, and                            oxygen saturations were monitored continuously. The                            Colonoscope was introduced through the anus and                            advanced to the 2 cm into the ileum. The                            colonoscopy was performed  without difficulty. The                            patient tolerated the procedure well. The quality                            of the bowel preparation was good except in some                            areas of the sigmoid colon where there was stool.                            Aggressive suctioning aspiration was performed.                            Overall the examination was adequate. The terminal                            ileum, ileocecal valve, appendiceal orifice, and                            rectum were photographed. Scope In: 11:14:52 AM Scope Out: 11:30:11 AM Scope Withdrawal Time: 0 hours 10 minutes 56 seconds  Total Procedure Duration: 0 hours 15 minutes 19 seconds  Findings:                 Multiple medium-mouthed diverticula were found in                            the sigmoid colon, descending colon and few in                            ascending colon. Few diverticula in the sigmoid                            colon had stool impacted. There was no endoscopic                            evidence of diverticulitis.  Non-bleeding internal hemorrhoids were found during                            retroflexion. The hemorrhoids were small.                           The terminal ileum appeared normal.                           The exam was otherwise without abnormality on                            direct and retroflexion views. Complications:            No immediate complications. Estimated Blood Loss:     Estimated blood loss: none. Impression:               - Moderate predominantly left colonic                            diverticulosis.                           - Otherwise normal colonoscopy to TI.                           - No specimens collected. Recommendation:           - Patient has a contact number available for                            emergencies. The signs and symptoms of potential                            delayed complications were  discussed with the                            patient. Return to normal activities tomorrow.                            Written discharge instructions were provided to the                            patient.                           - High fiber diet.                           - Continue present medications.                           - Repeat colonoscopy in 10 years for screening                            purposes. Earlier, if with any new problems or  change in family history.                           - Return to GI office PRN.                           - D/W Tawanna Cooler. Lynann Bologna, MD 11/11/2019 11:36:49 AM This report has been signed electronically.

## 2019-11-11 NOTE — Patient Instructions (Signed)
YOU HAD AN ENDOSCOPIC PROCEDURE TODAY AT THE [facility]:   Refer to the procedure report that was given to you for any specific questions about what was found during the examination.  If the procedure report does not answer your questions, please call your gastroenterologist to clarify.  If you requested that your care partner not be given the details of your procedure findings, then the procedure report has been included in a sealed envelope for you to review at your convenience later.  YOU SHOULD EXPECT: Some feelings of bloating in the abdomen. Passage of more gas than usual.  Walking can help get rid of the air that was put into your GI tract during the procedure and reduce the bloating. If you had a lower endoscopy (such as a colonoscopy or flexible sigmoidoscopy) you may notice spotting of blood in your stool or on the toilet paper. If you underwent a bowel prep for your procedure, you may not have a normal bowel movement for a few days.  Please Note:  You might notice some irritation and congestion in your nose or some drainage.  This is from the oxygen used during your procedure.  There is no need for concern and it should clear up in a day or so.  SYMPTOMS TO REPORT IMMEDIATELY:   Following lower endoscopy (colonoscopy or flexible sigmoidoscopy):  Excessive amounts of blood in the stool  Significant tenderness or worsening of abdominal pains  Swelling of the abdomen that is new, acute  Fever of 100F or higher  For urgent or emergent issues, a gastroenterologist can be reached at any hour by calling [phone]. Do not use MyChart messaging for urgent concerns.    DIET:  We do recommend a small meal at first, but then you may proceed to your regular diet.  Drink plenty of fluids but you should avoid alcoholic beverages for 24 hours.  ACTIVITY:  You should plan to take it easy for the rest of today and you should NOT DRIVE or use heavy machinery until tomorrow (because  of the sedation medicines used during the test).    FOLLOW UP: Our staff will call the number listed on your records 48-72 hours following your procedure to check on you and address any questions or concerns that you may have regarding the information given to you following your procedure. If we do not reach you, we will leave a message.  We will attempt to reach you two times.  During this call, we will ask if you have developed any symptoms of COVID 19. If you develop any symptoms (ie: fever, flu-like symptoms, shortness of breath, cough etc.) before then, please call [phone].  If you test positive for Covid 19 in the 2 weeks post procedure, please call and report this information to us.    If any biopsies were taken you will be contacted by phone or by letter within the next 1-3 weeks.  Please call us at [phone] if you have not heard about the biopsies in 3 weeks.    SIGNATURES/CONFIDENTIALITY: You and/or your care partner have signed paperwork which will be entered into your electronic medical record.  These signatures attest to the fact that that the information above on your After Visit Summary has been reviewed and is understood.  Full responsibility of the confidentiality of this discharge information lies with you and/or your care-partner. 

## 2019-11-11 NOTE — Progress Notes (Signed)
Vs by Idelle Jo RN in adm  Pt has redness( blood shot) sclera left eye on adm

## 2019-11-11 NOTE — Progress Notes (Signed)
A/ox3, pleased with MAC, report to RN 

## 2019-11-13 ENCOUNTER — Telehealth: Payer: Self-pay | Admitting: *Deleted

## 2019-11-13 NOTE — Telephone Encounter (Signed)
  Follow up Call-  Call back number 11/11/2019  Post procedure Call Back phone  # [phone]  Permission to leave phone message Yes  Some recent data might be hidden     Patient questions:  Do you have a fever, pain , or abdominal swelling? No. Pain Score  0 *  Have you tolerated food without any problems? No.  Have you been able to return to your normal activities? Yes.    Do you have any questions about your discharge instructions: Diet   No. Medications  No. Follow up visit  No.  Do you have questions or concerns about your Care? No.  Actions: * If pain score is 4 or above: No action needed, pain <4.

## 2019-11-20 ENCOUNTER — Other Ambulatory Visit: Payer: Self-pay

## 2019-11-20 ENCOUNTER — Ambulatory Visit (HOSPITAL_BASED_OUTPATIENT_CLINIC_OR_DEPARTMENT_OTHER)
Admission: RE | Admit: 2019-11-20 | Discharge: 2019-11-20 | Disposition: A | Discharge status: Home / Self Care | Payer: 59 | Source: Ambulatory Visit | Attending: Family Medicine | Admitting: Family Medicine

## 2019-11-20 DIAGNOSIS — E041 Nontoxic single thyroid nodule: Secondary | ICD-10-CM | POA: Diagnosis not present

## 2019-11-20 IMAGING — US US THYROID
1 series · 13 of 25 positions shown · non-contrast
Comparison: [DATE]

CLINICAL DATA: 63-year-old female with thyroid nodules

EXAM:
THYROID ULTRASOUND
TECHNIQUE: Ultrasound examination of the thyroid gland and adjacent soft
tissues was performed.

[Series 1: us thyroid · 30 acquisitions, 13 frames shown]
[im 1/30]
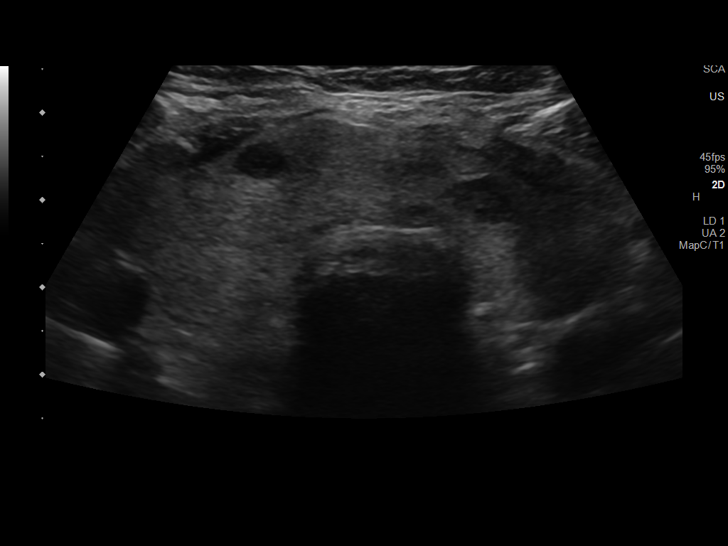
[im 3/30]
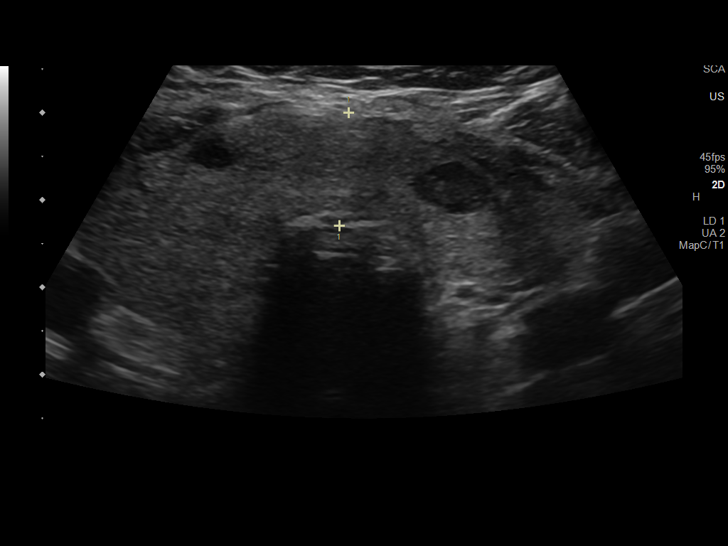
[im 5/30]
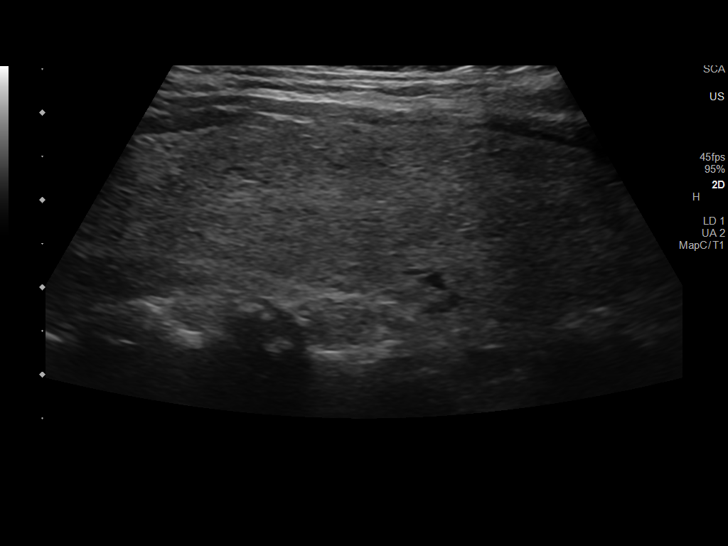
[im 8/30]
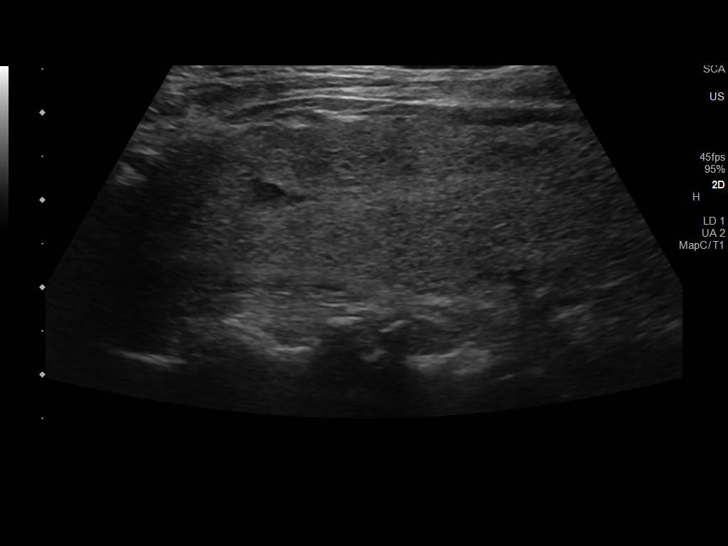
[im 10/30]
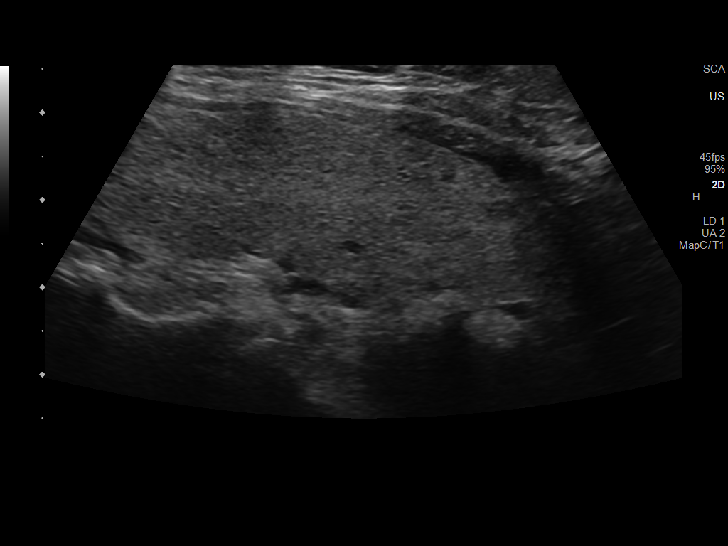
[im 13/30]
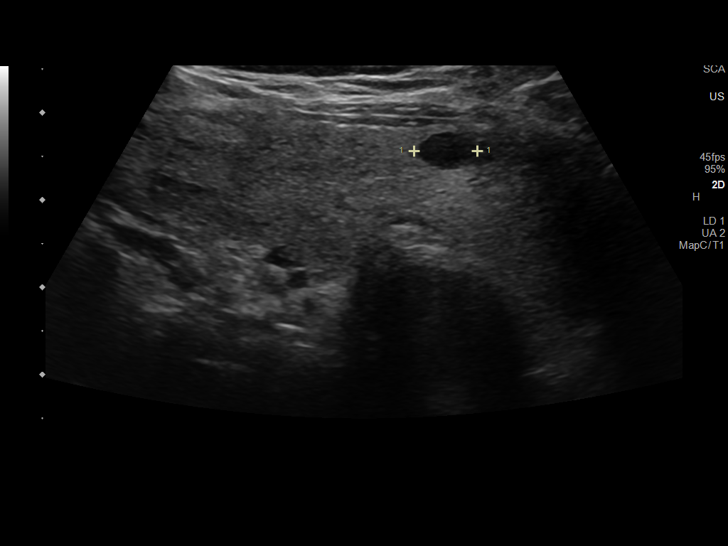
[im 15/30]
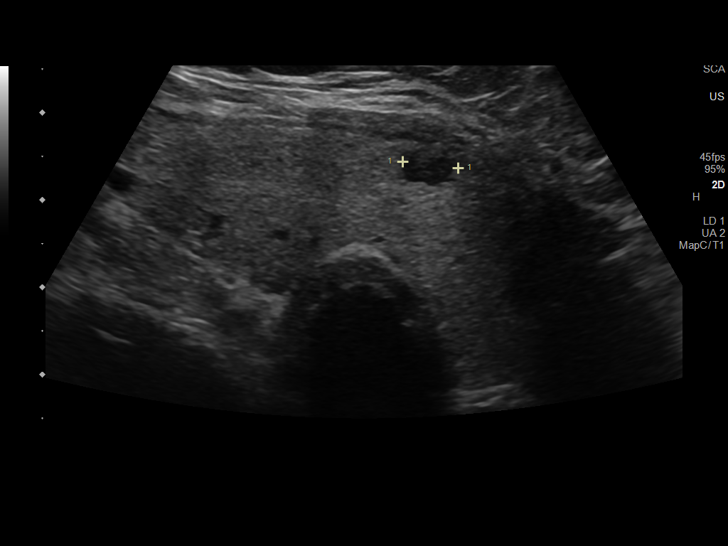
[im 17/30]
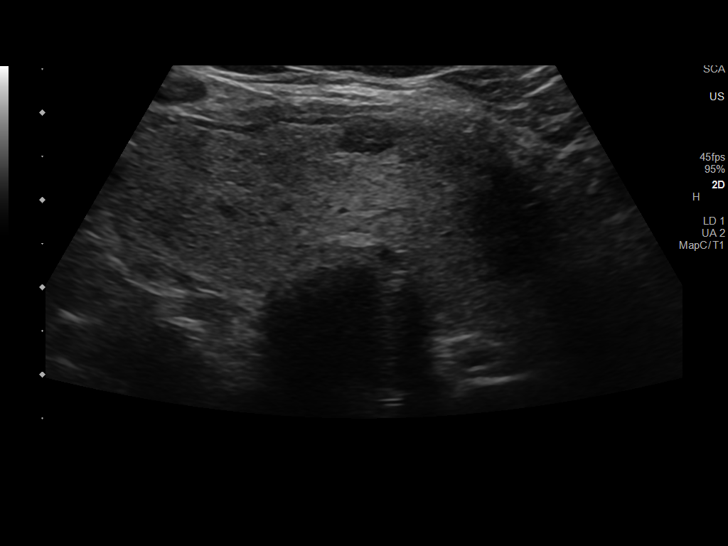
[im 20/30]
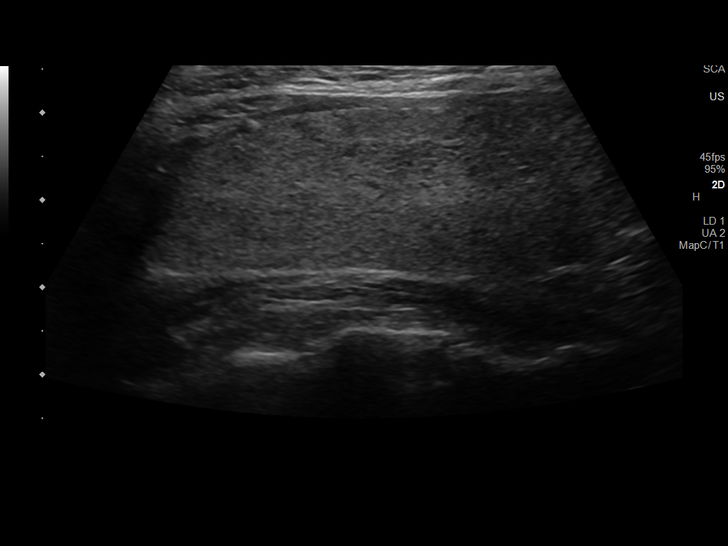
[im 22/30]
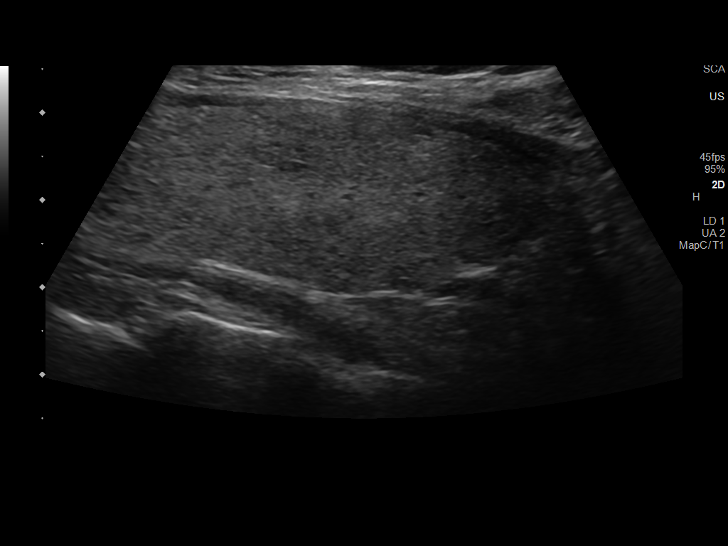
[im 25/30]
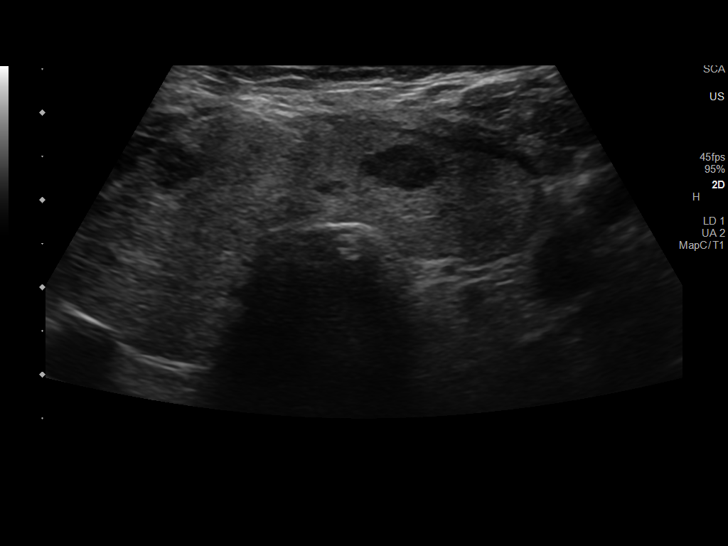
[im 27/30]
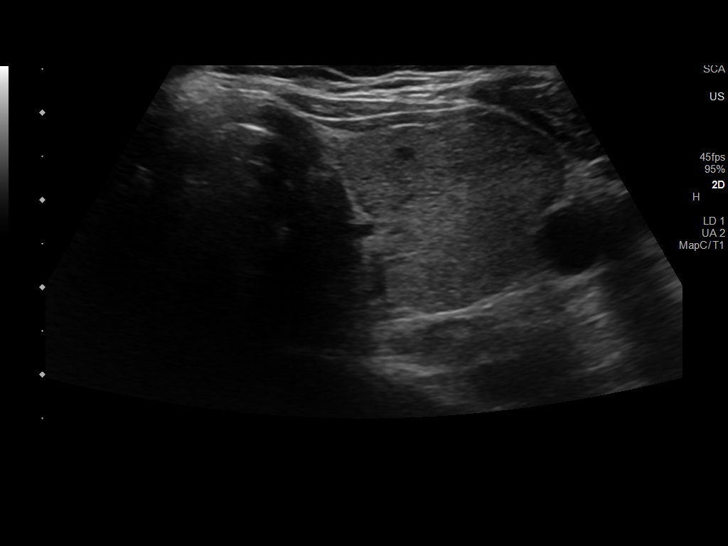
[im 30/30]
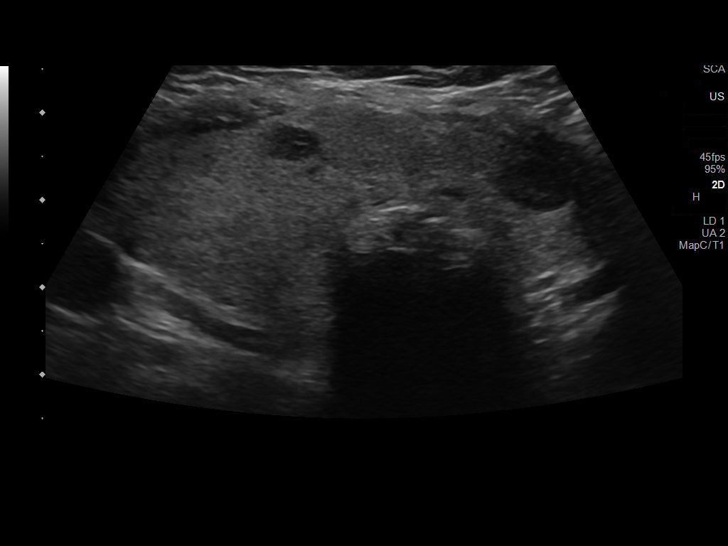

[13 of 25 positions shown; findings below may reference images not displayed]

FINDINGS: Parenchymal Echotexture: Mildly heterogenous

Isthmus: 1.3 cm

Right lobe: 6.8 cm x 2.0 cm x 2.3 cm

Left lobe: 6.5 cm x 2.0 cm x 2.3 cm

_________________________________________________________

Estimated total number of nodules >/= 1 cm: 1

Number of spongiform nodules >/=  2 cm not described below (TR1): 0

Number of mixed cystic and solid nodules >/= 1.5 cm not described
below (TR2): 0

_________________________________________________________

Nodules labeled 1 and 2 in the isthmus do not meet criteria for
further surveillance or biopsy.

Nodule labeled 3 inferior left thyroid, 1.0 cm, unchanged. This
remains TR 4 and meets criteria for continued surveillance.

No adenopathy
IMPRESSION: Multinodular thyroid.

Left inferior thyroid nodule (labeled 3, 1.0 cm, TR 4) meets
criteria for surveillance, as designated by the newly established
ACR TI-RADS criteria. Surveillance ultrasound study recommended to
be performed annually up to 5 years.

Recommendations follow those established by the new ACR TI-RADS
criteria ([HOSPITAL] [X0];[DATE]).

## 2019-11-20 NOTE — Addendum Note (Signed)
Addended by: Andrez Grime on: 11/20/2019 02:50 PM   Modules accepted: Orders

## 2019-12-10 ENCOUNTER — Other Ambulatory Visit: Payer: Self-pay

## 2019-12-11 ENCOUNTER — Encounter: Payer: Self-pay | Admitting: Family

## 2019-12-11 ENCOUNTER — Ambulatory Visit (INDEPENDENT_AMBULATORY_CARE_PROVIDER_SITE_OTHER): Payer: 59 | Admitting: Family

## 2019-12-11 VITALS — BP 126/74 | HR 79 | Temp 97.1°F | Ht 68.0 in | Wt 190.6 lb

## 2019-12-11 DIAGNOSIS — Z0000 Encounter for general adult medical examination without abnormal findings: Secondary | ICD-10-CM | POA: Diagnosis not present

## 2019-12-11 DIAGNOSIS — E663 Overweight: Secondary | ICD-10-CM | POA: Diagnosis not present

## 2019-12-11 DIAGNOSIS — Z23 Encounter for immunization: Secondary | ICD-10-CM

## 2019-12-11 DIAGNOSIS — E559 Vitamin D deficiency, unspecified: Secondary | ICD-10-CM

## 2019-12-11 DIAGNOSIS — E7800 Pure hypercholesterolemia, unspecified: Secondary | ICD-10-CM

## 2019-12-11 LAB — COMPREHENSIVE METABOLIC PANEL
ALT: 19 U/L (ref 0–35)
AST: 18 U/L (ref 0–37)
Albumin: 4.2 g/dL (ref 3.5–5.2)
Alkaline Phosphatase: 119 U/L — ABNORMAL HIGH (ref 39–117)
BUN: 18 mg/dL (ref 6–23)
CO2: 31 mEq/L (ref 19–32)
Calcium: 9.7 mg/dL (ref 8.4–10.5)
Chloride: 102 mEq/L (ref 96–112)
Creatinine, Ser: 0.77 mg/dL (ref 0.40–1.20)
GFR: 82.36 mL/min (ref >60.00)
Glucose, Bld: 93 mg/dL (ref 70–99)
Potassium: 4.6 mEq/L (ref 3.5–5.1)
Sodium: 141 mEq/L (ref 135–145)
Total Bilirubin: 0.4 mg/dL (ref 0.2–1.2)
Total Protein: 7.3 g/dL (ref 6.0–8.3)

## 2019-12-11 LAB — CBC WITH DIFFERENTIAL/PLATELET
Basophils Absolute: 0 10*3/uL (ref 0.0–0.1)
Basophils Relative: 0.4 % (ref 0.0–3.0)
Eosinophils Absolute: 0.3 10*3/uL (ref 0.0–0.7)
Eosinophils Relative: 2.6 % (ref 0.0–5.0)
HCT: 38.8 % (ref 36.0–46.0)
Hemoglobin: 13 g/dL (ref 12.0–15.0)
Lymphocytes Relative: 26.8 % (ref 12.0–46.0)
Lymphs Abs: 2.7 10*3/uL (ref 0.7–4.0)
MCHC: 33.4 g/dL (ref 30.0–36.0)
MCV: 86.3 fl (ref 78.0–100.0)
Monocytes Absolute: 0.7 10*3/uL (ref 0.1–1.0)
Monocytes Relative: 6.7 % (ref 3.0–12.0)
Neutro Abs: 6.3 10*3/uL (ref 1.4–7.7)
Neutrophils Relative %: 63.5 % (ref 43.0–77.0)
Platelets: 412 10*3/uL — ABNORMAL HIGH (ref 150.0–400.0)
RBC: 4.49 Mil/uL (ref 3.87–5.11)
RDW: 14.9 % (ref 11.5–15.5)
WBC: 9.9 10*3/uL (ref 4.0–10.5)

## 2019-12-11 LAB — TSH: TSH: 0.9 u[IU]/mL (ref 0.35–4.50)

## 2019-12-11 NOTE — Patient Instructions (Signed)
Healthy Eating Following a healthy eating pattern may help you to achieve and maintain a healthy body weight, reduce the risk of chronic disease, and live a long and productive life. It is important to follow a healthy eating pattern at an appropriate calorie level for your body. Your nutritional needs should be met primarily through food by choosing a variety of nutrient-rich foods. What are tips for following this plan? Reading food labels  Read labels and choose the following: ? Reduced or low sodium. ? Juices with 100% fruit juice. ? Foods with low saturated fats and high polyunsaturated and monounsaturated fats. ? Foods with whole grains, such as whole wheat, cracked wheat, brown rice, and wild rice. ? Whole grains that are fortified with folic acid. This is recommended for women who are pregnant or who want to become pregnant.  Read labels and avoid the following: ? Foods with a lot of added sugars. These include foods that contain brown sugar, corn sweetener, corn syrup, dextrose, fructose, glucose, high-fructose corn syrup, honey, invert sugar, lactose, malt syrup, maltose, molasses, raw sugar, sucrose, trehalose, or turbinado sugar.  Do not eat more than the following amounts of added sugar per day:  6 teaspoons (25 g) for women.  9 teaspoons (38 g) for men. ? Foods that contain processed or refined starches and grains. ? Refined grain products, such as white flour, degermed cornmeal, white bread, and white rice. Shopping  Choose nutrient-rich snacks, such as vegetables, whole fruits, and nuts. Avoid high-calorie and high-sugar snacks, such as potato chips, fruit snacks, and candy.  Use oil-based dressings and spreads on foods instead of solid fats such as butter, stick margarine, or cream cheese.  Limit pre-made sauces, mixes, and "instant" products such as flavored rice, instant noodles, and ready-made pasta.  Try more plant-protein sources, such as tofu, tempeh, black beans,  edamame, lentils, nuts, and seeds.  Explore eating plans such as the Mediterranean diet or vegetarian diet. Cooking  Use oil to saut or stir-fry foods instead of solid fats such as butter, stick margarine, or lard.  Try baking, boiling, grilling, or broiling instead of frying.  Remove the fatty part of meats before cooking.  Steam vegetables in water or broth. Meal planning   At meals, imagine dividing your plate into fourths: ? One-half of your plate is fruits and vegetables. ? One-fourth of your plate is whole grains. ? One-fourth of your plate is protein, especially lean meats, poultry, eggs, tofu, beans, or nuts.  Include low-fat dairy as part of your daily diet. Lifestyle  Choose healthy options in all settings, including home, work, school, restaurants, or stores.  Prepare your food safely: ? Wash your hands after handling raw meats. ? Keep food preparation surfaces clean by regularly washing with hot, soapy water. ? Keep raw meats separate from ready-to-eat foods, such as fruits and vegetables. ? Cook seafood, meat, poultry, and eggs to the recommended internal temperature. ? Store foods at safe temperatures. In general:  Keep cold foods at 59F (4.4C) or below.  Keep hot foods at 159F (60C) or above.  Keep your freezer at [facility] (-17.8C) or below.  Foods are no longer safe to eat when they have been between the temperatures of 40-159F (4.4-60C) for more than 2 hours. What foods should I eat? Fruits Aim to eat 2 cup-equivalents of fresh, canned (in natural juice), or frozen fruits each day. Examples of 1 cup-equivalent of fruit include 1 small apple, 8 large strawberries, 1 cup canned fruit,  cup  dried fruit, or 1 cup 100% juice. Vegetables Aim to eat 2-3 cup-equivalents of fresh and frozen vegetables each day, including different varieties and colors. Examples of 1 cup-equivalent of vegetables include 2 medium carrots, 2 cups raw, leafy greens, 1 cup chopped  vegetable (raw or cooked), or 1 medium baked potato. Grains Aim to eat 6 ounce-equivalents of whole grains each day. Examples of 1 ounce-equivalent of grains include 1 slice of bread, 1 cup ready-to-eat cereal, 3 cups popcorn, or  cup cooked rice, pasta, or cereal. Meats and other proteins Aim to eat 5-6 ounce-equivalents of protein each day. Examples of 1 ounce-equivalent of protein include 1 egg, 1/2 cup nuts or seeds, or 1 tablespoon (16 g) peanut butter. A cut of meat or fish that is the size of a deck of cards is about 3-4 ounce-equivalents.  Of the protein you eat each week, try to have at least 8 ounces come from seafood. This includes salmon, trout, herring, and anchovies. Dairy Aim to eat 3 cup-equivalents of fat-free or low-fat dairy each day. Examples of 1 cup-equivalent of dairy include 1 cup (240 mL) milk, 8 ounces (250 g) yogurt, 1 ounces (44 g) natural cheese, or 1 cup (240 mL) fortified soy milk. Fats and oils  Aim for about 5 teaspoons (21 g) per day. Choose monounsaturated fats, such as canola and olive oils, avocados, peanut butter, and most nuts, or polyunsaturated fats, such as sunflower, corn, and soybean oils, walnuts, pine nuts, sesame seeds, sunflower seeds, and flaxseed. Beverages  Aim for six 8-oz glasses of water per day. Limit coffee to three to five 8-oz cups per day.  Limit caffeinated beverages that have added calories, such as soda and energy drinks.  Limit alcohol intake to no more than 1 drink a day for nonpregnant women and 2 drinks a day for men. One drink equals 12 oz of beer (355 mL), 5 oz of wine (148 mL), or 1 oz of hard liquor (44 mL). Seasoning and other foods  Avoid adding excess amounts of salt to your foods. Try flavoring foods with herbs and spices instead of salt.  Avoid adding sugar to foods.  Try using oil-based dressings, sauces, and spreads instead of solid fats. This information is based on general [place] nutrition guidelines. For more  information, visit [employer]. Exact amounts may vary based on your nutrition needs. Summary  A healthy eating plan may help you to maintain a healthy weight, reduce the risk of chronic diseases, and stay active throughout your life.  Plan your meals. Make sure you eat the right portions of a variety of nutrient-rich foods.  Try baking, boiling, grilling, or broiling instead of frying.  Choose healthy options in all settings, including home, work, school, restaurants, or stores. This information is not intended to replace advice given to you by your health care provider. Make sure you discuss any questions you have with your health care provider. Document Revised: 04/17/2017 Document Reviewed: 04/17/2017 Elsevier Patient Education  [place].

## 2019-12-11 NOTE — Progress Notes (Signed)
Established Patient Office Visit  Subjective:  Patient ID: Ann Newton, female    DOB: 1955-12-17  Age: 64 y.o. MRN: [id number]  CC:  Chief Complaint  Patient presents with  . Annual Exam    CPE, no concerns. Fasting for labs.     HPI Ann Newton presents for CPE. She sees GYN. Mammogram and colonoscopy completed this year. COVID-19 vaccine in August. Would like flu shot today. Has a history of HLD, Vitamin D def, and being overweight. She has recently begun exercising. No particular diet.   Past Medical History:  Diagnosis Date  . Arthritis   . Hyperlipidemia    diet controlled  . Psoriasis   . Thyroid disease   . Wears contact lenses     Past Surgical History:  Procedure Laterality Date  . ABDOMINAL HYSTERECTOMY  2000   part  . ANTERIOR AND POSTERIOR REPAIR N/A 09/08/2016   Procedure: ANTERIOR (CYSTOCELE) AND POSTERIOR REPAIR (RECTOCELE)/Perineorrhaphy;  Surgeon: Brien Few, MD;  Location: [facility];  Service: Gynecology;  Laterality: N/A;  Requests 89min. Repair of enterocele/posterior  . COLONOSCOPY     ~12 yrs ago- normal per pt   . COSMETIC SURGERY  1999   breast implants  . HAGLAND'S DEFORMITY EXCISION Left 09/13/2013   Procedure: LEFT FOOT: EXCISION INTERDIGITAL MORTON'S NEUROMA SINGLE EACH;  Surgeon: Yvette Rack., MD;  Location: [facility];  Service: Orthopedics;  Laterality: Left;  . KNEE ARTHROSCOPY  2013,2011   right/left  . SHOULDER ARTHROSCOPY     rt  . TUBAL LIGATION    . WISDOM TOOTH EXTRACTION      Family History  Problem Relation Age of Onset  . Kidney disease Mother   . Diabetes Mother   . Heart attack Father   . Rheum arthritis Brother   . Healthy Daughter   . Healthy Son   . Heart attack Maternal Grandmother   . Heart attack Maternal Grandfather   . Cancer Paternal Grandmother   . Heart attack Paternal Grandfather   . Rheum arthritis Brother   . Colon cancer Neg Hx   . Colon polyps Neg Hx   . Esophageal  cancer Neg Hx   . Rectal cancer Neg Hx   . Stomach cancer Neg Hx     Social History   Socioeconomic History  . Marital status: Married    Spouse name: Not on file  . Number of children: Not on file  . Years of education: Not on file  . Highest education level: Not on file  Occupational History  . Not on file  Tobacco Use  . Smoking status: Former Smoker    Packs/day: 0.50    Years: 10.00    Pack years: 5.00    Types: Cigarettes    Quit date: 09/12/2012    Years since quitting: 7.2  . Smokeless tobacco: Never Used  Vaping Use  . Vaping Use: Never used  Substance and Sexual Activity  . Alcohol use: No  . Drug use: No  . Sexual activity: Yes    Birth control/protection: Surgical  Other Topics Concern  . Not on file  Social History Narrative  . Not on file   Social Determinants of Health   Financial Resource Strain:   . Difficulty of Paying Living Expenses: Not on file  Food Insecurity:   . Worried About Charity fundraiser in the Last Year: Not on file  . Ran Out of Food in the Last Year: Not  on file  Transportation Needs:   . Lack of Transportation (Medical): Not on file  . Lack of Transportation (Non-Medical): Not on file  Physical Activity:   . Days of Exercise per Week: Not on file  . Minutes of Exercise per Session: Not on file  Stress:   . Feeling of Stress : Not on file  Social Connections:   . Frequency of Communication with Friends and Family: Not on file  . Frequency of Social Gatherings with Friends and Family: Not on file  . Attends Religious Services: Not on file  . Active Member of Clubs or Organizations: Not on file  . Attends Archivist Meetings: Not on file  . Marital Status: Not on file  Intimate Partner Violence:   . Fear of Current or Ex-Partner: Not on file  . Emotionally Abused: Not on file  . Physically Abused: Not on file  . Sexually Abused: Not on file    Outpatient Medications Prior to Visit  Medication Sig Dispense  Refill  . acyclovir (ZOVIRAX) 400 MG tablet Take 400 mg by mouth daily.    Marland Kitchen Apremilast (OTEZLA) 30 MG TABS Take 30 mg by mouth 2 (two) times daily.     . calcipotriene (DOVONOX) 0.005 % cream Apply topically daily.    . clobetasol ointment (TEMOVATE) 0.05 %     . diclofenac sodium (VOLTAREN) 1 % GEL 3 grams to 3 large joints up to 3 times daily 3 Tube 3  . Multiple Vitamins-Minerals (CENTRUM SILVER 50+WOMEN) TABS Take 1 tablet by mouth daily.    Marland Kitchen OVER THE COUNTER MEDICATION Viviscal 2 daily    . rosuvastatin (CRESTOR) 20 MG tablet Take 1 tablet (20 mg total) by mouth daily. 90 tablet 1  . VITAMIN D PO Take by mouth.    . meloxicam (MOBIC) 15 MG tablet Take 15 mg by mouth as needed.  (Patient not taking: Reported on 12/11/2019)    . metroNIDAZOLE (METROGEL) 0.75 % gel Apply 1 application topically as needed.  (Patient not taking: Reported on 12/11/2019)     No facility-administered medications prior to visit.    No Known Allergies  ROS Review of Systems  All other systems reviewed and are negative.     Objective:    Physical Exam Vitals and nursing note reviewed.  Constitutional:      Appearance: Normal appearance. She is obese.  HENT:     Head: Normocephalic and atraumatic.     Right Ear: Tympanic membrane, ear canal and external ear normal. There is no impacted cerumen.     Left Ear: Tympanic membrane, ear canal and external ear normal. There is no impacted cerumen.     Nose: Nose normal.     Mouth/Throat:     Mouth: Mucous membranes are moist.     Pharynx: Oropharynx is clear.  Eyes:     Extraocular Movements: Extraocular movements intact.     Conjunctiva/sclera: Conjunctivae normal.     Pupils: Pupils are equal, round, and reactive to light.  Cardiovascular:     Rate and Rhythm: Normal rate and regular rhythm.     Pulses: Normal pulses.     Heart sounds: Normal heart sounds. No murmur heard.   Pulmonary:     Effort: Pulmonary effort is normal.     Breath sounds:  Normal breath sounds.  Abdominal:     General: Abdomen is flat. Bowel sounds are normal.     Palpations: Abdomen is soft.     Tenderness: There  is no guarding or rebound.  Musculoskeletal:        General: Normal range of motion.     Cervical back: Normal range of motion and neck supple.  Skin:    General: Skin is warm and dry.     Capillary Refill: Capillary refill takes less than 2 seconds.  Neurological:     General: No focal deficit present.     Mental Status: She is alert and oriented to person, place, and time. Mental status is at baseline.  Psychiatric:        Mood and Affect: Mood normal.        Behavior: Behavior normal.        Thought Content: Thought content normal.        Judgment: Judgment normal.     BP 126/74   Pulse 79   Temp (!) 97.1 F (36.2 C) (Tympanic)   Ht $R'5\' 8"'mB$  (1.727 m)   Wt 190 lb 9.6 oz (86.5 kg)   BMI 28.98 kg/m  Wt Readings from Last 3 Encounters:  12/11/19 190 lb 9.6 oz (86.5 kg)  11/11/19 187 lb (84.8 kg)  10/28/19 187 lb (84.8 kg)     Health Maintenance Due  Topic Date Due  . HIV Screening  Never done  . PAP SMEAR-Modifier  01/18/2020    There are no preventive care reminders to display for this patient.  Lab Results  Component Value Date   TSH 0.65 11/09/2018   Lab Results  Component Value Date   WBC 7.2 11/09/2018   HGB 13.3 11/09/2018   HCT 39.5 11/09/2018   MCV 91.6 11/09/2018   PLT 398.0 11/09/2018   Lab Results  Component Value Date   NA 141 05/29/2019   K 5.1 05/29/2019   CO2 29 05/29/2019   GLUCOSE 107 (H) 05/29/2019   BUN 12 05/29/2019   CREATININE 0.70 05/29/2019   BILITOT 0.3 05/29/2019   ALKPHOS 151 (H) 05/29/2019   AST 14 05/29/2019   ALT 14 05/29/2019   PROT 7.1 05/29/2019   ALBUMIN 3.7 05/29/2019   CALCIUM 9.5 05/29/2019   ANIONGAP 8 09/02/2016   GFR 84.67 05/29/2019   Lab Results  Component Value Date   CHOL 228 (H) 05/29/2019   Lab Results  Component Value Date   HDL 42.70 05/29/2019    Lab Results  Component Value Date   LDLCALC 161 (H) 05/29/2019   Lab Results  Component Value Date   TRIG 122.0 05/29/2019   Lab Results  Component Value Date   CHOLHDL 5 05/29/2019   Lab Results  Component Value Date   HGBA1C 5.8 11/09/2018      Assessment & Plan:   Problem List Items Addressed This Visit    Vitamin D deficiency   Relevant Orders   Vitamin D 1,25 dihydroxy   TSH   Comp Met (CMET)   CBC w/Diff   Lipid Panel With LDL/HDL Ratio   Need for influenza vaccination - Primary   Relevant Orders   Flu Vaccine QUAD 6+ mos PF IM (Fluarix Quad PF) (Completed)   h/o being Overweight (Chronic)   Relevant Orders   Vitamin D 1,25 dihydroxy   TSH   Comp Met (CMET)   CBC w/Diff   Lipid Panel With LDL/HDL Ratio    Other Visit Diagnoses    Routine adult health maintenance       Relevant Orders   Vitamin D 1,25 dihydroxy   TSH   Comp Met (CMET)   CBC w/Diff  Lipid Panel With LDL/HDL Ratio   Hypercholesteremia       Relevant Orders   Vitamin D 1,25 dihydroxy   TSH   Comp Met (CMET)   CBC w/Diff   Lipid Panel With LDL/HDL Ratio      No orders of the defined types were placed in this encounter.   Follow-up: Return in about 6 months (around 06/09/2020).    Kennyth Arnold, FNP

## 2019-12-12 LAB — LIPID PANEL WITH LDL/HDL RATIO
Cholesterol, Total: 218 mg/dL — ABNORMAL HIGH (ref 100–199)
HDL: 50 mg/dL (ref >39)
LDL Chol Calc (NIH): 129 mg/dL — ABNORMAL HIGH (ref 0–99)
LDL/HDL Ratio: 2.6 ratio (ref 0.0–3.2)
Triglycerides: 218 mg/dL — ABNORMAL HIGH (ref 0–149)
VLDL Cholesterol Cal: 39 mg/dL (ref 5–40)

## 2019-12-15 LAB — VITAMIN D 1,25 DIHYDROXY
Vitamin D 1, 25 (OH)2 Total: 33 pg/mL (ref 18–72)
Vitamin D2 1, 25 (OH)2: <8 pg/mL
Vitamin D3 1, 25 (OH)2: 33 pg/mL

## 2019-12-16 ENCOUNTER — Telehealth: Payer: Self-pay

## 2019-12-16 NOTE — Telephone Encounter (Signed)
Health form filled out/faxed to [facility] @ [phone]. Patient notified VIA phone and original mailed to home address per request of patient.  Dm/cma

## 2020-06-19 NOTE — Progress Notes (Signed)
06/22/20- 64yoF former smoker (5 pkyrs) for sleep evaluation self referred with concern of possible sleep apnea   Husband Todd our pt for [id number] Medical problem list includes Thryoid nodule, Neuropathy, Psoriasis, Osteoarthritis,  Epworth score- 3 Body weight today-193 lbs Covid vax-2 Moderna Was having more insomnia with difficult sleep onset in the past- better recently.  Husband tells her of loud snoring. She rubs her legs together, but no significant parasomnia. No ENT surgery, heart or lung disease.  Arthritis in knees. Thyroid being watched. Not bad daytime sleepiness, no naps. Works from home on [employer].   Prior to Admission medications   Medication Sig Start Date End Date Taking? Authorizing Provider  acyclovir (ZOVIRAX) 400 MG tablet Take 400 mg by mouth as needed. 01/04/19  Yes [provider]  Apremilast 30 MG TABS Take 30 mg by mouth 2 (two) times daily.    Yes [provider]  calcipotriene (DOVONOX) 0.005 % cream Apply topically daily. 12/06/19  Yes [provider]  clobetasol ointment (TEMOVATE) 0.05 %  02/12/18  Yes [provider]  diclofenac sodium (VOLTAREN) 1 % GEL 3 grams to 3 large joints up to 3 times daily 03/16/18  Yes Deveshwar, Janalyn Rouse, MD  meloxicam (MOBIC) 7.5 MG tablet Take 7.5 mg by mouth daily.   Yes [provider]  Multiple Vitamins-Minerals (CENTRUM SILVER 50+WOMEN) TABS Take 1 tablet by mouth daily.   Yes [provider]   Past Medical History:  Diagnosis Date  . Arthritis   . Hyperlipidemia    diet controlled  . Psoriasis   . Thyroid disease   . Wears contact lenses    Past Surgical History:  Procedure Laterality Date  . ABDOMINAL HYSTERECTOMY  2000   part  . ANTERIOR AND POSTERIOR REPAIR N/A 09/08/2016   Procedure: ANTERIOR (CYSTOCELE) AND POSTERIOR REPAIR (RECTOCELE)/Perineorrhaphy;  Surgeon: Olivia Mackie, MD;  Location: [facility];  Service: Gynecology;  Laterality: N/A;  Requests .  Repair of enterocele/posterior  . COLONOSCOPY     ~12 yrs ago- normal per pt   . COSMETIC SURGERY  1999   breast implants  . HAGLAND'S DEFORMITY EXCISION Left 09/13/2013   Procedure: LEFT FOOT: EXCISION INTERDIGITAL MORTON'S NEUROMA SINGLE EACH;  Surgeon: Thera Flake., MD;  Location: [facility];  Service: Orthopedics;  Laterality: Left;  . KNEE ARTHROSCOPY  2013,2011   right/left  . SHOULDER ARTHROSCOPY     rt  . TUBAL LIGATION    . WISDOM TOOTH EXTRACTION     Family History  Problem Relation Age of Onset  . Kidney disease Mother   . Diabetes Mother   . Heart attack Father   . Rheum arthritis Brother   . Healthy Daughter   . Healthy Son   . Heart attack Maternal Grandmother   . Heart attack Maternal Grandfather   . Cancer Paternal Grandmother   . Heart attack Paternal Grandfather   . Rheum arthritis Brother   . Colon cancer Neg Hx   . Colon polyps Neg Hx   . Esophageal cancer Neg Hx   . Rectal cancer Neg Hx   . Stomach cancer Neg Hx    Social History   Socioeconomic History  . Marital status: Married    Spouse name: Not on file  . Number of children: Not on file  . Years of education: Not on file  . Highest education level: Not on file  Occupational History  . Not on file  Tobacco Use  . Smoking status:  Former Smoker    Packs/day: 0.50    Years: 10.00    Pack years: 5.00    Types: Cigarettes    Quit date: 09/12/2012    Years since quitting: 7.7  . Smokeless tobacco: Never Used  Vaping Use  . Vaping Use: Never used  Substance and Sexual Activity  . Alcohol use: No  . Drug use: No  . Sexual activity: Yes    Birth control/protection: Surgical  Other Topics Concern  . Not on file  Social History Narrative  . Not on file   Social Determinants of Health   Financial Resource Strain: Not on file  Food Insecurity: Not on file  Transportation Needs: Not on file  Physical Activity: Not on file  Stress: Not on file  Social Connections: Not on  file  Intimate Partner Violence: Not on file   ROS-see HPI + = positive Constitutional:    weight loss, night sweats, fevers, chills, fatigue, lassitude. HEENT:    headaches, difficulty swallowing, tooth/dental problems, sore throat,       sneezing, itching, ear ache, nasal congestion, post nasal drip, snoring CV:    chest pain, orthopnea, PND, swelling in lower extremities, anasarca,                                  dizziness, palpitations Resp:   shortness of breath with exertion or at rest.                productive cough,   non-productive cough, coughing up of blood.              change in color of mucus.  wheezing.   Skin:    rash or lesions. GI:  No-   heartburn, indigestion, abdominal pain, nausea, vomiting, diarrhea,                 change in bowel habits, loss of appetite GU: dysuria, change in color of urine, no urgency or frequency.   flank pain. MS:   +joint pain, stiffness, decreased range of motion, back pain. Neuro-     nothing unusual Psych:  change in mood or affect.  depression or anxiety.   memory loss.  OBJ- Physical Exam General- Alert, Oriented, Affect-appropriate, Distress- none acute, + overweight Skin- rash-none, lesions- none, excoriation- none Lymphadenopathy- none Head- atraumatic            Eyes- Gross vision intact, PERRLA, conjunctivae and secretions clear            Ears- Hearing, canals-normal            Nose- Clear, no-Septal dev, mucus, polyps, erosion, perforation             Throat- Mallampati II , mucosa clear , drainage- none, tonsils- atrophic, + teeth Neck- flexible , trachea midline, no stridor , thyroid nl, carotid no bruit Chest - symmetrical excursion , unlabored           Heart/CV- RRR , no murmur , no gallop  , no rub, nl s1 s2                           - JVD- none , edema- none, stasis changes- none, varices- none           Lung- clear to P&A, wheeze- none, cough- none , dullness-none, rub- none  Chest wall-  Abd-  Br/ Gen/  Rectal- Not done, not indicated Extrem- cyanosis- none, clubbing, none, atrophy- none, strength- nl Neuro- grossly intact to observation

## 2020-06-22 ENCOUNTER — Ambulatory Visit (INDEPENDENT_AMBULATORY_CARE_PROVIDER_SITE_OTHER): Payer: 59 | Admitting: Internal Medicine

## 2020-06-22 ENCOUNTER — Encounter: Payer: Self-pay | Admitting: Internal Medicine

## 2020-06-22 ENCOUNTER — Other Ambulatory Visit: Payer: Self-pay

## 2020-06-22 VITALS — BP 120/20 | HR 76 | Ht 68.0 in | Wt 193.0 lb

## 2020-06-22 DIAGNOSIS — R0683 Snoring: Secondary | ICD-10-CM

## 2020-06-22 DIAGNOSIS — E663 Overweight: Secondary | ICD-10-CM | POA: Diagnosis not present

## 2020-06-22 NOTE — Assessment & Plan Note (Signed)
Husband has OSA and is concerned about what he has noticed with her. Appropriate education, questions answered. Plan- Sleep study

## 2020-06-22 NOTE — Patient Instructions (Signed)
Order- schedule home sleep test      dx snoring  Please callus for results and recommendations about 2 weeks after your sleep test  

## 2020-06-22 NOTE — Assessment & Plan Note (Signed)
She estimates 20-25 lb gain in recent years.  Plan- encourage normal weight. Consider healthy Weight and Wellness

## 2020-06-25 NOTE — Progress Notes (Signed)
Office Visit Note  Patient: Ann Newton             Date of Birth: 1955-12-05           MRN: [id number]             PCP: Mliss Sax, MD Referring: Mliss Sax,* Visit Date: 07/09/2020 Occupation: @GUAROCC @  Subjective:  Tingling in feet and legs.   History of Present Illness: Ann Newton is a 65 y.o. female with a history of psoriasis and osteoarthritis.  She states she has some psoriasis patches on her palms and her soles.  She has been using some topical agents and also using 64 which has been helpful.  She continues to have some discomfort in her both hips.  She was seen by an orthopedic surgeon and is a scheduled to have MRI of her hip joints.  She continues to have knee joint pain.  The right knee joint has been more painful recently.  Continues to have some discomfort in her lower back.  Left shoulder has been painful recently.  Activities of Daily Living:  Patient reports morning stiffness for all day. Patient Reports nocturnal pain.  Difficulty dressing/grooming: Denies Difficulty climbing stairs: Reports Difficulty getting out of chair: Reports Difficulty using hands for taps, buttons, cutlery, and/or writing: Denies  Review of Systems  Constitutional:  Negative for fatigue.  HENT:  Negative for mouth sores, mouth dryness and nose dryness.   Eyes:  Negative for pain, itching and dryness.  Respiratory:  Negative for shortness of breath and difficulty breathing.   Cardiovascular:  Negative for chest pain and palpitations.  Gastrointestinal:  Negative for blood in stool, constipation and diarrhea.  Endocrine: Negative for increased urination.  Genitourinary:  Negative for difficulty urinating.  Musculoskeletal:  Positive for joint pain, joint pain, joint swelling, myalgias, morning stiffness, muscle tenderness and myalgias.  Skin:  Negative for color change, rash and redness.  Allergic/Immunologic: Negative for susceptible to infections.   Neurological:  Positive for numbness. Negative for dizziness, headaches, memory loss and weakness.  Hematological:  Positive for bruising/bleeding tendency.  Psychiatric/Behavioral:  Negative for confusion.    PMFS History:  Patient Active Problem List   Diagnosis Date Noted   Thyroid nodule greater than or equal to 1 cm in diameter incidentally noted on imaging study 11/20/2018   Fatigue 11/09/2018   Elevated glucose 11/09/2018   Snores 11/09/2018   Hair loss 11/09/2018   Need for influenza vaccination 11/09/2018   Thyromegaly 11/09/2018   Allergic reaction 05/17/2018   Maculopapular rash 05/17/2018   Family history of rheumatoid arthritis 03/09/2018   History of vitamin D deficiency 03/09/2018   Primary osteoarthritis of both knees 02/22/2018   Primary osteoarthritis of both hands 02/22/2018   Neuropathy 11/22/2017   Chronic pain of both knees 11/22/2017   Need for shingles vaccine 11/22/2017   Vitamin D deficiency 01/24/2016   Smoking hx- ( less 1 ppd * 15 yrs) - Quit Jan 17 2013 12/28/2015   Elevated LDL cholesterol level 12/28/2015   Elevated vitamin B12 level 12/28/2015   h/o Mild peripheral edema- takes HCTZ for this 12/20/2015   Dietary B12 deficiency 12/20/2015   History of chickenpox ( Aug '17 )  12/20/2015   Healthcare maintenance 12/20/2015   Overweight 12/03/2015   h/o Hypertriglyceridemia 12/03/2015   h/o Hyperlipidemia 12/03/2015   Pustular psoriasis of palms and soles 10/28/2010    Past Medical History:  Diagnosis Date   Arthritis  Hyperlipidemia    diet controlled   Psoriasis    Thyroid disease    Wears contact lenses     Family History  Problem Relation Age of Onset   Kidney disease Mother    Diabetes Mother    Heart attack Father    Rheum arthritis Brother    Healthy Daughter    Healthy Son    Heart attack Maternal Grandmother    Heart attack Maternal Grandfather    Cancer Paternal Grandmother    Heart attack Paternal Grandfather    Rheum  arthritis Brother    Colon cancer Neg Hx    Colon polyps Neg Hx    Esophageal cancer Neg Hx    Rectal cancer Neg Hx    Stomach cancer Neg Hx    Past Surgical History:  Procedure Laterality Date   ABDOMINAL HYSTERECTOMY  2000   part   ANTERIOR AND POSTERIOR REPAIR N/A 09/08/2016   Procedure: ANTERIOR (CYSTOCELE) AND POSTERIOR REPAIR (RECTOCELE)/Perineorrhaphy;  Surgeon: Olivia Mackieaavon, Richard, MD;  Location: [facility];  Service: Gynecology;  Laterality: N/A;  Requests 90min. Repair of enterocele/posterior   COLONOSCOPY     ~12 yrs ago- normal per pt    COSMETIC SURGERY  1999   breast implants   HAGLAND'S DEFORMITY EXCISION Left 09/13/2013   Procedure: LEFT FOOT: EXCISION INTERDIGITAL MORTON'S NEUROMA SINGLE EACH;  Surgeon: Thera FlakeW D Caffrey Jr., MD;  Location: [facility];  Service: Orthopedics;  Laterality: Left;   KNEE ARTHROSCOPY  2013,2011   right/left   SHOULDER ARTHROSCOPY     rt   TUBAL LIGATION     WISDOM TOOTH EXTRACTION     Social History   Social History Narrative   Not on file   Immunization History  Administered Date(s) Administered   Influenza,inj,Quad PF,6+ Mos 12/03/2015, 11/22/2017, 11/09/2018, 12/11/2019   Moderna Sars-Covid-2 Vaccination 08/19/2019, 09/16/2019   Pneumococcal Polysaccharide-23 11/22/2017   Tdap 02/09/2009, 05/29/2019   Zoster Recombinat (Shingrix) 11/09/2018, 05/29/2019     Objective: Vital Signs: BP 124/71 (BP Location: Left Arm, Patient Position: Sitting, Cuff Size: Normal)   Pulse 80   Resp 15   Ht 5\' 8"  (1.727 m)   Wt 197 lb 9.6 oz (89.6 kg)   BMI 30.04 kg/m    Physical Exam Vitals and nursing note reviewed.  Constitutional:      Appearance: She is well-developed.  HENT:     Head: Normocephalic and atraumatic.  Eyes:     Conjunctiva/sclera: Conjunctivae normal.  Cardiovascular:     Rate and Rhythm: Normal rate and regular rhythm.     Heart sounds: Normal heart sounds.  Pulmonary:     Effort: Pulmonary effort is normal.      Breath sounds: Normal breath sounds.  Abdominal:     General: Bowel sounds are normal.     Palpations: Abdomen is soft.  Musculoskeletal:     Cervical back: Normal range of motion.  Lymphadenopathy:     Cervical: No cervical adenopathy.  Skin:    General: Skin is warm and dry.     Capillary Refill: Capillary refill takes less than 2 seconds.  Neurological:     Mental Status: She is alert and oriented to person, place, and time.  Psychiatric:        Behavior: Behavior normal.     Musculoskeletal Exam: C-spine was in good range of motion.  She had painful range of motion of her left shoulder joint.  Elbow joints and wrist joints with good range of motion.  She had no synovitis over MCPs PIPs or DIPs.  She had tenderness over bilateral SI joints.  She had painful limited range of motion of bilateral hip joints.  She has warmth and swelling in bilateral knee joints.  There was no tenderness over ankles or MTPs.  She had no tenderness over Achilles tendon or plantar fascia.  CDAI Exam: CDAI Score: 6.2  Patient Global: 6 mm; Provider Global: 6 mm Swollen: 2 ; Tender: 7  Joint Exam 07/09/2020      Right  Left  Glenohumeral      Tender  Sacroiliac   Tender   Tender  Hip   Tender   Tender  Knee  Swollen Tender  Swollen Tender     Investigation: No additional findings.  Imaging: No results found.  Recent Labs: Lab Results  Component Value Date   WBC 9.9 12/11/2019   HGB 13.0 12/11/2019   PLT 412.0 (H) 12/11/2019   NA 141 12/11/2019   K 4.6 12/11/2019   CL 102 12/11/2019   CO2 31 12/11/2019   GLUCOSE 93 12/11/2019   BUN 18 12/11/2019   CREATININE 0.77 12/11/2019   BILITOT 0.4 12/11/2019   ALKPHOS 119 (H) 12/11/2019   AST 18 12/11/2019   ALT 19 12/11/2019   PROT 7.3 12/11/2019   ALBUMIN 4.2 12/11/2019   CALCIUM 9.7 12/11/2019   GFRAA >60 09/02/2016    Speciality Comments: No specialty comments available.  Procedures:  No procedures performed Allergies: Patient  has no known allergies.   Assessment / Plan:     Visit Diagnoses: Psoriatic arthropathy (HCC) -patient gives history of pain and discomfort in multiple joints.  She had warmth and swelling in bilateral knee joints today and also tenderness over bilateral SI joints.  She has painful limited range of motion of her hip joints.  She will be getting MRI of the hip joints by Dr. Madelon Lips.  He had detailed discussion regarding psoriatic arthritis.  She meets the criteria as she has history of psoriasis, inflammatory arthritis, sacroiliitis and negative rheumatoid factor.  She would benefit from more aggressive therapy.  We discussed the option of starting on Tremfya.  Information regarding Tremfya was given for her review.  We will obtain labs today.  Once the labs are available she will come in for a follow-up in couple of weeks to discuss the option of starting on Tremfya.  We may discontinue Otezla.  She cannot be on any anti-TNF's that she has had recurrent problems with precancerous lesions on her extremities excised by her dermatologist per patient.  Plan: Sedimentation rate  Pustular psoriasis of palms and soles - last visit: 03/16/2018 and per office note: On Otezla for the last 3 years.  She continues to have pustular psoriasis on her bilateral palms and plantar surface of her feet.  She has not noticed much improvement with Otezla and topical agents.  High risk medication use -in anticipation to start on Tremfya will obtain following labs today.  She had hepatitis panel in the past and also a chest x-ray in the past.  Plan: CBC with Differential/Platelet, COMPLETE METABOLIC PANEL WITH GFR, QuantiFERON-TB Gold Plus, Serum protein electrophoresis with reflex, IgG, IgA, IgM, HIV Antibody (routine testing w rflx)  Primary osteoarthritis of both hands-she has underlying osteoarthritis but no synovitis was noted.  Effusion, left knee - Crystals negative synovial fluid WBC 896.  Knee joint was injected and  aspirated on 02/14/2018.   Chronic pain of both hips-she had painful limited range of  motion of her hip joints.  She will be getting MRI of her hip joints by Dr. Madelon Lips.  Primary osteoarthritis of both knees - Bilateral moderate lateral compartment narrowing and mild chondromalacia patella.  She has been followed by Dr. Madelon Lips and gets Visco supplement injections.  She had warmth and swelling in her bilateral knee joints today.  Paresthesia of both feet -she complains of paresthesias in her bilateral lower extremities.  I will refer her to neurology for evaluation.  Plan: Ambulatory referral to Neurology  Family history of rheumatoid arthritis - In 2 of her brothers  History of vitamin D deficiency  Neuropathy  h/o Hypertriglyceridemia  Smoking hx- ( less 1 ppd * 15 yrs) - Quit Jan 17 2013  Orders: Orders Placed This Encounter  Procedures   CBC with Differential/Platelet   COMPLETE METABOLIC PANEL WITH GFR   Sedimentation rate   QuantiFERON-TB Gold Plus   Serum protein electrophoresis with reflex   IgG, IgA, IgM   HIV Antibody (routine testing w rflx)   Ambulatory referral to Neurology   No orders of the defined types were placed in this encounter.    Follow-Up Instructions: Return in about 2 weeks (around 07/23/2020) for Osteoarthritis , Ps, Psoriatic arthritis.   Pollyann Savoy, MD  Note - This record has been created using Animal nutritionist.  Chart creation errors have been sought, but may not always  have been located. Such creation errors do not reflect on  the standard of medical care.

## 2020-07-09 ENCOUNTER — Telehealth: Payer: Self-pay | Admitting: Internal Medicine

## 2020-07-09 ENCOUNTER — Ambulatory Visit (INDEPENDENT_AMBULATORY_CARE_PROVIDER_SITE_OTHER): Payer: 59 | Admitting: Rheumatology

## 2020-07-09 ENCOUNTER — Other Ambulatory Visit: Payer: Self-pay | Admitting: Orthopedic Surgery

## 2020-07-09 ENCOUNTER — Encounter: Payer: Self-pay | Admitting: Rheumatology

## 2020-07-09 ENCOUNTER — Other Ambulatory Visit: Payer: Self-pay

## 2020-07-09 VITALS — BP 124/71 | HR 80 | Resp 15 | Ht 68.0 in | Wt 197.6 lb

## 2020-07-09 DIAGNOSIS — E781 Pure hyperglyceridemia: Secondary | ICD-10-CM

## 2020-07-09 DIAGNOSIS — Z79899 Other long term (current) drug therapy: Secondary | ICD-10-CM | POA: Diagnosis not present

## 2020-07-09 DIAGNOSIS — Z8261 Family history of arthritis: Secondary | ICD-10-CM

## 2020-07-09 DIAGNOSIS — L403 Pustulosis palmaris et plantaris: Secondary | ICD-10-CM | POA: Diagnosis not present

## 2020-07-09 DIAGNOSIS — R202 Paresthesia of skin: Secondary | ICD-10-CM

## 2020-07-09 DIAGNOSIS — M19041 Primary osteoarthritis, right hand: Secondary | ICD-10-CM | POA: Diagnosis not present

## 2020-07-09 DIAGNOSIS — G629 Polyneuropathy, unspecified: Secondary | ICD-10-CM

## 2020-07-09 DIAGNOSIS — M19042 Primary osteoarthritis, left hand: Secondary | ICD-10-CM

## 2020-07-09 DIAGNOSIS — M25559 Pain in unspecified hip: Secondary | ICD-10-CM

## 2020-07-09 DIAGNOSIS — M25462 Effusion, left knee: Secondary | ICD-10-CM

## 2020-07-09 DIAGNOSIS — L4050 Arthropathic psoriasis, unspecified: Secondary | ICD-10-CM | POA: Diagnosis not present

## 2020-07-09 DIAGNOSIS — M170 Bilateral primary osteoarthritis of knee: Secondary | ICD-10-CM

## 2020-07-09 DIAGNOSIS — M1990 Unspecified osteoarthritis, unspecified site: Secondary | ICD-10-CM

## 2020-07-09 DIAGNOSIS — Z87891 Personal history of nicotine dependence: Secondary | ICD-10-CM

## 2020-07-09 DIAGNOSIS — G8929 Other chronic pain: Secondary | ICD-10-CM

## 2020-07-09 DIAGNOSIS — M25551 Pain in right hip: Secondary | ICD-10-CM

## 2020-07-09 DIAGNOSIS — Z8639 Personal history of other endocrine, nutritional and metabolic disease: Secondary | ICD-10-CM

## 2020-07-09 DIAGNOSIS — M25552 Pain in left hip: Secondary | ICD-10-CM

## 2020-07-09 NOTE — Telephone Encounter (Signed)
Pt was told she would need to do an HST and that someone would be in contact with her after her appt on6/6. I did inform patient that there's usually a waiting period before being contacted for the HST and while she verbalized understanding, she did want the PCC's to know that she reached out to be scheduled.

## 2020-07-09 NOTE — Telephone Encounter (Signed)
Pt's study is one that I had pulled to schedule.  Called her today & left vm for her to call me so we can schedule.

## 2020-07-09 NOTE — Telephone Encounter (Signed)
Spoke to pt & got hst scheduled.  Nothing further needed.

## 2020-07-09 NOTE — Patient Instructions (Signed)
Standing Labs We placed an order today for your standing lab work.   Please have your standing labs drawn in 1 month then every 3 months   If possible, please have your labs drawn 2 weeks prior to your appointment so that the provider can discuss your results at your appointment.  Please note that you may see your imaging and lab results in MyChart before we have reviewed them. We may be awaiting multiple results to interpret others before contacting you. Please allow our office up to 72 hours to thoroughly review all of the results before contacting the office for clarification of your results.  We have open lab daily: Monday through Thursday from 1:30-4:30 PM and Friday from 1:30-4:00 PM at the office of Dr. Shaili Deveshwar, [facility].   Please be advised, all patients with office appointments requiring lab work will take precedent over walk-in lab work.  If possible, please come for your lab work on Monday and Friday afternoons, as you may experience shorter wait times. The office is located at [address], [address], [place], [place] [address] No appointment is necessary.   Labs are drawn by [facility]. Please bring your co-pay at the time of your lab draw.  You may receive a bill from [facility] for your lab work.  If you wish to have your labs drawn at another location, please call the office 24 hours in advance to send orders.  If you have any questions regarding directions or hours of operation,  please call [phone].   As a reminder, please drink plenty of water prior to coming for your lab work. Thanks!   Guselkumab injection What is this medication? GUSELKUMAB (goo ZELK ue mab) is used to treat plaque psoriasis and psoriaticarthritis. This medicine may be used for other purposes; ask your health care provider orpharmacist if you have questions. COMMON BRAND NAME(S): Tremfya What should I tell my care team before I take this medication? They need to know if you  have any of these conditions: immune system problems infection (especially a virus infection such as chickenpox, cold sores, or herpes) or history of infections recently received or scheduled to receive a vaccine tuberculosis, a positive skin test for tuberculosis, or have recently been in close contact with someone who has tuberculosis an unusual or allergic reaction to guselkumab, other medicines, foods, dyes, or preservatives pregnant or trying to get pregnant breast-feeding How should I use this medication? This medicine is for injection under the skin. It may be administered by a healthcare professional in a hospital or clinic setting or at home. If you get this medicine at home, you will be taught how to prepare and give this medicine. Use exactly as directed. Take your medicine at regular intervals. Donot take your medicine more often than directed. It is important that you put your used injectors, needles and syringes in a special sharps container. Do not put them in a trash can. If you do not have asharps container, call your pharmacist or healthcare provider to get one. A special MedGuide will be given to you by the pharmacist with eachprescription and refill. Be sure to read this information carefully each time. Talk to your pediatrician regarding the use of this medicine in children.Special care may be needed. Overdosage: If you think you have taken too much of this medicine contact apoison control center or emergency room at once. NOTE: This medicine is only for you. Do not share this medicine with others. What if I miss   a dose? It is important not to miss your dose. Call your doctor of health care professional if you are unable to keep an appointment. If you give yourself the medicine and you miss a dose, take it as soon as you can. Then, take your nextdose at your regular scheduled time. What may interact with this medication? Do not take this medicine with any of the following  medications: live virus vaccines This medicine may also interact with the following medications: amoxapine certain medicines for depression, anxiety, or psychotic disturbances like amitriptyline, clomipramine, desipramine, doxepin, imipramine, maprotiline, nortriptyline, protriptyline, trimipramine codeine inactivated vaccines methadone pimozide thioridazine This list may not describe all possible interactions. Give your health care provider a list of all the medicines, herbs, non-prescription drugs, or dietary supplements you use. Also tell them if you smoke, drink alcohol, or use illegaldrugs. Some items may interact with your medicine. What should I watch for while using this medication? Your condition will be monitored carefully while you are receiving this medicine. Tell your doctor or healthcare professional if your symptoms do notstart to get better or if they get worse. You will be tested for tuberculosis (TB) before you start this medicine. If your doctor prescribes any medicine for TB, you should start taking the TB medicine before starting this medicine. Make sure to finish the full course ofTB medicine. Call your doctor or health care professional if you get a cold or other infection while receiving this medicine. Do not treat yourself. This medicinemay decrease your body's ability to fight infection. What side effects may I notice from receiving this medication? Side effects that you should report to your doctor or health care professionalas soon as possible: allergic reactions like skin rash, itching or hives, swelling of the face, lips, or tongue breathing problems chest pain or chest tightness dizziness, feeling faint or lightheaded signs and symptoms of infection like fever or chills; cough; sore throat; pain or trouble passing urine Side effects that usually do not require medical attention (report these toyour doctor or health care professional if they continue or are  bothersome): diarrhea headache joint pain pain, redness, irritation at site where injected This list may not describe all possible side effects. Call your doctor for medical advice about side effects. You may report side effects to FDA at1-800-FDA-1088. Where should I keep my medication? Keep out of the reach of children. Store unopened syringes or injectors in a refrigerator between 2 to 8 degrees C (36 to 46 degrees F). Keep in the original container until ready for use. Protect from light. Do not freeze. Do not shake. Prior to use, remove the syringe or injector from the refrigerator and use after 30 minutes at room temperature. Throw away any unused medicine after the expiration date on thelabel. NOTE: This sheet is a summary. It may not cover all possible information. If you have questions about this medicine, talk to your doctor, pharmacist, orhealth care provider.  2022 Elsevier/Gold Standard (2018-08-01 15:10:10)  

## 2020-07-10 ENCOUNTER — Ambulatory Visit: Payer: 59

## 2020-07-10 DIAGNOSIS — R0683 Snoring: Secondary | ICD-10-CM

## 2020-07-10 DIAGNOSIS — G4733 Obstructive sleep apnea (adult) (pediatric): Secondary | ICD-10-CM | POA: Diagnosis not present

## 2020-07-13 ENCOUNTER — Telehealth: Payer: Self-pay | Admitting: Pharmacist

## 2020-07-13 LAB — PROTEIN ELECTROPHORESIS, SERUM, WITH REFLEX
Albumin ELP: 4.2 g/dL (ref 3.8–4.8)
Alpha 1: 0.3 g/dL (ref 0.2–0.3)
Alpha 2: 0.8 g/dL (ref 0.5–0.9)
Beta 2: 0.4 g/dL (ref 0.2–0.5)
Beta Globulin: 0.4 g/dL (ref 0.4–0.6)
Gamma Globulin: 1.1 g/dL (ref 0.8–1.7)
Total Protein: 7.2 g/dL (ref 6.1–8.1)

## 2020-07-13 LAB — COMPLETE METABOLIC PANEL WITH GFR
AG Ratio: 1.5 (calc) (ref 1.0–2.5)
ALT: 18 U/L (ref 6–29)
AST: 20 U/L (ref 10–35)
Albumin: 4.3 g/dL (ref 3.6–5.1)
Alkaline phosphatase (APISO): 126 U/L (ref 37–153)
BUN: 22 mg/dL (ref 7–25)
CO2: 26 mmol/L (ref 20–32)
Calcium: 9.8 mg/dL (ref 8.6–10.4)
Chloride: 102 mmol/L (ref 98–110)
Creat: 0.79 mg/dL (ref 0.50–0.99)
GFR, Est African American: 92 mL/min/{1.73_m2} (ref >=60)
GFR, Est Non African American: 80 mL/min/{1.73_m2} (ref >=60)
Globulin: 2.9 g/dL (calc) (ref 1.9–3.7)
Glucose, Bld: 81 mg/dL (ref 65–99)
Potassium: 4.8 mmol/L (ref 3.5–5.3)
Sodium: 140 mmol/L (ref 135–146)
Total Bilirubin: 0.3 mg/dL (ref 0.2–1.2)
Total Protein: 7.2 g/dL (ref 6.1–8.1)

## 2020-07-13 LAB — QUANTIFERON-TB GOLD PLUS
Mitogen-NIL: 2.84 IU/mL
NIL: 0.02 IU/mL
QuantiFERON-TB Gold Plus: NEGATIVE
TB1-NIL: 0 IU/mL
TB2-NIL: 0 IU/mL

## 2020-07-13 LAB — CBC WITH DIFFERENTIAL/PLATELET
Absolute Monocytes: 682 cells/uL (ref 200–950)
Basophils Absolute: 48 cells/uL (ref 0–200)
Basophils Relative: 0.5 %
Eosinophils Absolute: 298 cells/uL (ref 15–500)
Eosinophils Relative: 3.1 %
HCT: 40 % (ref 35.0–45.0)
Hemoglobin: 13.4 g/dL (ref 11.7–15.5)
Lymphs Abs: 3197 cells/uL (ref 850–3900)
MCH: 29.2 pg (ref 27.0–33.0)
MCHC: 33.5 g/dL (ref 32.0–36.0)
MCV: 87.1 fL (ref 80.0–100.0)
MPV: 10 fL (ref 7.5–12.5)
Monocytes Relative: 7.1 %
Neutro Abs: 5376 cells/uL (ref 1500–7800)
Neutrophils Relative %: 56 %
Platelets: 443 10*3/uL — ABNORMAL HIGH (ref 140–400)
RBC: 4.59 10*6/uL (ref 3.80–5.10)
RDW: 13.1 % (ref 11.0–15.0)
Total Lymphocyte: 33.3 %
WBC: 9.6 10*3/uL (ref 3.8–10.8)

## 2020-07-13 LAB — SEDIMENTATION RATE: Sed Rate: 34 mm/h — ABNORMAL HIGH (ref 0–30)

## 2020-07-13 LAB — IGG, IGA, IGM
IgG (Immunoglobin G), Serum: 1173 mg/dL (ref 600–1540)
IgM, Serum: 106 mg/dL (ref 50–300)
Immunoglobulin A: 296 mg/dL (ref 70–320)

## 2020-07-13 LAB — HIV ANTIBODY (ROUTINE TESTING W REFLEX): HIV 1&2 Ab, 4th Generation: NONREACTIVE

## 2020-07-13 NOTE — Progress Notes (Signed)
Office Visit Note  Patient: Ann Newton             Date of Birth: 12-16-1955           MRN: [id number]             PCP: Libby Maw, MD Referring: Libby Maw,* Visit Date: 07/27/2020 Occupation: @GUAROCC @  Subjective:  Discuss tremfya   History of Present Illness: Ann Newton is a 65 y.o. female with history of psoriatic arthritis.  She has been taking Otezla 30 mg 1 tablet by mouth twice daily but continues to have recurrent flares and active psoriasis.  She is currently having pain and stiffness in both shoulders, both hips, and both knee joints.  She states that she is scheduled for an MRI of both hips tomorrow ordered by Dr. French Ana.  She continues to have intermittent swelling in both knees, especially the right knee.  She has been taking meloxicam 7.5 mg 1 tablet by mouth daily as needed for pain relief.  Patient presents today discuss starting on tremfya.    Activities of Daily Living:  Patient reports morning stiffness for all day. Patient Reports nocturnal pain.  Difficulty dressing/grooming: Denies Difficulty climbing stairs: Reports Difficulty getting out of chair: Reports Difficulty using hands for taps, buttons, cutlery, and/or writing: Denies  Review of Systems  Constitutional:  Negative for fatigue.  HENT:  Negative for mouth sores, mouth dryness and nose dryness.   Eyes:  Negative for pain, itching and dryness.  Respiratory:  Negative for shortness of breath and difficulty breathing.   Cardiovascular:  Negative for chest pain and palpitations.  Gastrointestinal:  Negative for blood in stool, constipation and diarrhea.  Endocrine: Negative for increased urination.  Genitourinary:  Negative for difficulty urinating.  Musculoskeletal:  Positive for joint pain, joint pain, joint swelling, myalgias, morning stiffness, muscle tenderness and myalgias.  Skin:  Positive for rash. Negative for color change.  Allergic/Immunologic: Negative  for susceptible to infections.  Neurological:  Positive for numbness and parasthesias. Negative for dizziness, headaches, memory loss and weakness.  Hematological:  Negative for bruising/bleeding tendency.  Psychiatric/Behavioral:  Negative for confusion.    PMFS History:  Patient Active Problem List   Diagnosis Date Noted   Thyroid nodule greater than or equal to 1 cm in diameter incidentally noted on imaging study 11/20/2018   Fatigue 11/09/2018   Elevated glucose 11/09/2018   Snores 11/09/2018   Hair loss 11/09/2018   Need for influenza vaccination 11/09/2018   Thyromegaly 11/09/2018   Allergic reaction 05/17/2018   Maculopapular rash 05/17/2018   Family history of rheumatoid arthritis 03/09/2018   History of vitamin D deficiency 03/09/2018   Primary osteoarthritis of both knees 02/22/2018   Primary osteoarthritis of both hands 02/22/2018   Neuropathy 11/22/2017   Chronic pain of both knees 11/22/2017   Need for shingles vaccine 11/22/2017   Vitamin D deficiency 01/24/2016   Smoking hx- ( less 1 ppd * 15 yrs) - Quit Jan 17 2013 12/28/2015   Elevated LDL cholesterol level 12/28/2015   Elevated vitamin B12 level 12/28/2015   h/o Mild peripheral edema- takes HCTZ for this 12/20/2015   Dietary B12 deficiency 12/20/2015   History of chickenpox ( Aug '17 )  12/20/2015   Healthcare maintenance 12/20/2015   Overweight 12/03/2015   h/o Hypertriglyceridemia 12/03/2015   h/o Hyperlipidemia 12/03/2015   Pustular psoriasis of palms and soles 10/28/2010    Past Medical History:  Diagnosis Date   Arthritis  Hyperlipidemia    diet controlled   Psoriasis    Thyroid disease    Wears contact lenses     Family History  Problem Relation Age of Onset   Kidney disease Mother    Diabetes Mother    Heart attack Father    Rheum arthritis Brother    Healthy Daughter    Healthy Son    Heart attack Maternal Grandmother    Heart attack Maternal Grandfather    Cancer Paternal Grandmother     Heart attack Paternal Grandfather    Rheum arthritis Brother    Colon cancer Neg Hx    Colon polyps Neg Hx    Esophageal cancer Neg Hx    Rectal cancer Neg Hx    Stomach cancer Neg Hx    Past Surgical History:  Procedure Laterality Date   ABDOMINAL HYSTERECTOMY  2000   part   ANTERIOR AND POSTERIOR REPAIR N/A 09/08/2016   Procedure: ANTERIOR (CYSTOCELE) AND POSTERIOR REPAIR (RECTOCELE)/Perineorrhaphy;  Surgeon: Brien Few, MD;  Location: [facility];  Service: Gynecology;  Laterality: N/A;  Requests 83min. Repair of enterocele/posterior   COLONOSCOPY     ~12 yrs ago- normal per pt    COSMETIC SURGERY  1999   breast implants   HAGLAND'S DEFORMITY EXCISION Left 09/13/2013   Procedure: LEFT FOOT: EXCISION INTERDIGITAL MORTON'S NEUROMA SINGLE EACH;  Surgeon: Yvette Rack., MD;  Location: [facility];  Service: Orthopedics;  Laterality: Left;   KNEE ARTHROSCOPY  2013,2011   right/left   SHOULDER ARTHROSCOPY     rt   TUBAL LIGATION     WISDOM TOOTH EXTRACTION     Social History   Social History Narrative   Not on file   Immunization History  Administered Date(s) Administered   Influenza,inj,Quad PF,6+ Mos 12/03/2015, 11/22/2017, 11/09/2018, 12/11/2019   Moderna Sars-Covid-2 Vaccination 08/19/2019, 09/16/2019   Pneumococcal Polysaccharide-23 11/22/2017   Tdap 02/09/2009, 05/29/2019   Zoster Recombinat (Shingrix) 11/09/2018, 05/29/2019     Objective: Vital Signs: BP 116/72 (BP Location: Left Arm, Patient Position: Sitting, Cuff Size: Normal)   Pulse (!) 101   Resp 16   Ht $R'5\' 8"'os$  (1.727 m)   Wt 194 lb 12.8 oz (88.4 kg)   BMI 29.62 kg/m    Physical Exam Vitals and nursing note reviewed.  Constitutional:      Appearance: She is well-developed.  HENT:     Head: Normocephalic and atraumatic.  Eyes:     Conjunctiva/sclera: Conjunctivae normal.  Pulmonary:     Effort: Pulmonary effort is normal.  Abdominal:     Palpations: Abdomen is soft.   Musculoskeletal:     Cervical back: Normal range of motion.  Skin:    General: Skin is warm and dry.     Capillary Refill: Capillary refill takes less than 2 seconds.  Neurological:     Mental Status: She is alert and oriented to person, place, and time.  Psychiatric:        Behavior: Behavior normal.     Musculoskeletal Exam: C-spine, thoracic spine, and lumbar spine good ROM.  Midline spinal tenderness in the lumbar region.  No SI joint tenderness.  Pain and stiffness with shoulder ROM bilaterally.  Elbow joints, wrist joints, MCPs, PIPs, and DIPs good ROM with no synovitis.  Complete fist formation bilaterally.  PIP and DIP thickening consistent with osteoarthritis of both hands.  Painful and limited ROM of both hips.  Tenderness over trochanteric bursa bilaterally.  Painful ROM of both knee joints.  Warmth and inflammation of the right knee.  Ankle joints good ROM with no tenderness or joint swelling.  No evidence of achilles tendonitis.   CDAI Exam: CDAI Score: -- Patient Global: --; Provider Global: -- Swollen: --; Tender: -- Joint Exam 07/27/2020   No joint exam has been documented for this visit   There is currently no information documented on the homunculus. Go to the Rheumatology activity and complete the homunculus joint exam.  Investigation: No additional findings.  Imaging: No results found.  Recent Labs: Lab Results  Component Value Date   WBC 9.6 07/09/2020   HGB 13.4 07/09/2020   PLT 443 (H) 07/09/2020   NA 140 07/09/2020   K 4.8 07/09/2020   CL 102 07/09/2020   CO2 26 07/09/2020   GLUCOSE 81 07/09/2020   BUN 22 07/09/2020   CREATININE 0.79 07/09/2020   BILITOT 0.3 07/09/2020   ALKPHOS 119 (H) 12/11/2019   AST 20 07/09/2020   ALT 18 07/09/2020   PROT 7.2 07/09/2020   PROT 7.2 07/09/2020   ALBUMIN 4.2 12/11/2019   CALCIUM 9.8 07/09/2020   GFRAA 92 07/09/2020   QFTBGOLDPLUS NEGATIVE 07/09/2020    Speciality Comments: No specialty comments  available.  Procedures:  No procedures performed Allergies: Patient has no known allergies.   Assessment / Plan:     Visit Diagnoses: Psoriatic arthropathy ([place]) -History of psoriasis, inflammatory arthritis, sacroiliitis, RF-: She continues to have persistent discomfort in both shoulders, both hip joints, and both knee joints.  She is scheduled for an MRI of both hips tomorrow ordered by Dr. French Ana.  She has warmth and inflammation in the right knee joint on examination today.  No other synovitis or dactylitis was noted.  No evidence of Achilles tendinitis or plantar fasciitis noted.  She has pustular psoriasis on the palmar aspect of the right hand and plantar surface of both feet.  She has been taking Otezla 30 mg 1 tablet by mouth twice daily and using clobetasol cream topically as needed.  She presented today to further discuss switching from [place] to [place].  Indications, contraindications, potential side effects of Tremfya were discussed today in detail and all questions were addressed.  Consent was obtained.  She will be scheduled next week to initiate Tremfya in the office in order to be monitored for an allergic reaction.  She will discontinue [place] once she has started on Tremfya.  She is in agreement with the plan.  She will follow-up in the office in 2 months to assess her response to [facility].  Counseled patient that Tremfya is a IL-23 inhibitor.  Counseled patient on purpose, proper use, and adverse effects of Tremfya.  Reviewed the most common adverse effects including infection, URTIs, injection site reactions, nausea/diarrhea, and recurrence of tinea and HSV infections Counseled patient that Tremfya should be held for infection and prior to scheduled surgery.  Recommend annual influenza, PCV 15 or PCV20 or Pneumovax 23, and Shingrix as indicated.  Reviewed the importance of regular labs while on Tremfya therapy.  Will monitor CBC and CMP 1 month after starting and then every 3 months  routinely thereafter. Will monitor TB gold annually. Standing orders placed.  Provided patient with medication education material and answered all questions.  Patient voiced understanding.  Patient consented to [facility].  Will upload consent into the media tab.  Reviewed storage instructions of Tremfya.  Advised initial injection must be administered in office.  Patient verbalized understanding.  Will apply for Tremfya through patient's insurance and update  when we receive a response.  Dose will be Tremfya $RemoveBe'100mg'[id number]$  mg on weeks 0 and 4 then every 8 weeks thereafter.  Prescription pending lab results and insurance approval.  Pustular psoriasis of palms and soles: Evidence of pustular psoriasis on the palmar aspect of the right hand and plantar of both feet.  She has been taking Otezla 30 mg 1 tablet twice daily for the past 3 years prescribed by her dermatologist.  She has been using clobetasol ointment as needed.  She will be switching from [place] to tremfya.    High risk medication use - She will be starting on Tremfya next week.  We will provide a sample in the office at her new start visit.  She will discontinue Otezla when starting on Tremfya.- Plan: CBC with Differential/Platelet, COMPLETE METABOLIC PANEL WITH GFR Hepatitis B and C negative on 02/14/18.  07/09/20: HIV-, immunoglobulins WNL, SPEP unremarkable, TB gold negative, and ESR WNL.   CBC and CMP updated on 07/09/20.  She will require updated lab work 1 month then every 3 months after starting on tremfya.  Standing orders for CBC and CMP were placed today. She has not had any recent infections. She was advised to hold Tremfya if she develops signs or symptoms of an infection and to resume once the infection has completely cleared.  Primary osteoarthritis of both hands: She has PIP and DIP thickening consistent with osteoarthritis of both hands.  No tenderness or synovitis was noted.  She was able to make a complete fist bilaterally.  Effusion, left  knee - Crystals negative synovial fluid WBC 896.  Knee joint was injected and aspirated on 02/14/2018.   Chronic pain of both hips - She will be getting MRI of her hip joints tomorrow ordered by Dr. French Ana.  She has painful and limited range of motion of both hip joints on examination today.  Tenderness palpation over bilateral trochanteric bursa.  She experiences pain and stiffness in both hips after sitting for prolonged periods of time.  At times she has difficulty rising from a seated position.  Primary osteoarthritis of both knees - Bilateral moderate lateral compartment narrowing and mild chondromalacia patella.  She has been followed by Dr. French Ana and gets Visco supplement injections.  She has painful range of motion of both knee joints.  Warmth of the right knee noted.  She has been experiencing nocturnal pain in both knees and has difficulty walking prolonged distances due to the discomfort.  Paresthesia of both feet - Referred to neurology at the last visit.   Other medical conditions are listed as follows:  Family history of rheumatoid arthritis - In 2 of her brothers  History of vitamin D deficiency  Neuropathy  h/o Hypertriglyceridemia  Smoking hx- ( less 1 ppd * 15 yrs) - Quit Jan 17 2013  Orders: Orders Placed This Encounter  Procedures   CBC with Differential/Platelet   COMPLETE METABOLIC PANEL WITH GFR    No orders of the defined types were placed in this encounter.    Follow-Up Instructions: Return in about 2 months (around 09/27/2020) for Psoriatic arthritis.   Ofilia Neas, PA-C  Note - This record has been created using Dragon software.  Chart creation errors have been sought, but may not always  have been located. Such creation errors do not reflect on  the standard of medical care.

## 2020-07-13 NOTE — Telephone Encounter (Signed)
Please start Tremfya BIV.  Dose: 100mg  at Week 0, 4, and every 8 weeks thereafter  Dx: L40.50 (PsA), L403 (psoriasis)  Current therapy: , PharmD, MPH Clinical Pharmacist (Rheumatology and Pulmonology)

## 2020-07-13 NOTE — Telephone Encounter (Addendum)
Submitted a Prior Authorization request to [facility] for [facility] via CoverMyMeds. Will update once we receive a response.  Key: [id number]  May need to submit a quantity limit PA for the loading doses after PA approval.  Patient can enroll for [facility] Copay card.

## 2020-07-13 NOTE — Progress Notes (Signed)
CBC shows mildly elevated platelets.  We will continue to monitor labs.  CMP is normal.  Sed rate is mildly elevated.  TB Gold is negative.  SPEP is normal, HIV is negative.  Patient has an appointment to start on Tremfya.

## 2020-07-14 ENCOUNTER — Other Ambulatory Visit (HOSPITAL_COMMUNITY): Payer: Self-pay

## 2020-07-14 NOTE — Telephone Encounter (Signed)
Received notification from [facility] regarding a prior authorization for [facility]. Authorization has been APPROVED from 07/13/20 to 07/13/21.   Patient must fill through [facility]: [phone]   Will need quantity limit prior auth for loading doses (for Week 0, Week 4). Unable to run test claim. Routing back to pharmacy pool  Authorization #  [id number] Phone # [phone].

## 2020-07-15 ENCOUNTER — Other Ambulatory Visit (HOSPITAL_COMMUNITY): Payer: Self-pay

## 2020-07-15 NOTE — Telephone Encounter (Addendum)
Received notification from [facility] regarding a prior authorization for [facility]. Authorization has been APPROVED from 07/15/20 to 08/14/20. This authorization includes 2 pens per 28 days (for Week 0 and Week 4 doses)  Maintenance authorization is approved from 07/13/20 through 07/10/21 for 1 pen per 42 days  Patient must fill through [facility]: [phone]   Authorization # [id number] Phone # [phone]  Will hold on starting Tremfya since it seems that plan to move forward with Tremfya wasn't set in place at last OV. She has OV with Sherron Ales,  PA-C, on 07/27/20 to discuss Tremfya.  Chesley Mires, PharmD, MPH Clinical Pharmacist (Rheumatology and Pulmonology)

## 2020-07-16 ENCOUNTER — Encounter: Payer: Self-pay | Admitting: Neurology

## 2020-07-22 DIAGNOSIS — G4733 Obstructive sleep apnea (adult) (pediatric): Secondary | ICD-10-CM

## 2020-07-23 NOTE — Telephone Encounter (Signed)
Received a MyChart message from patient asking for the results of her HST.   Dr. Maple Hudson, can you please advise? Thanks!

## 2020-07-23 NOTE — Telephone Encounter (Signed)
Her home sleep test showed mild obstructive sleep apnea, averaging 8 apneas/ hour with drops in blood oxygen level. For scores in this range, some people can manage by losing weight (at least 10-15 lbs might be enough), and sleep ing off the flat of her back. If snoring was a [problem before she gained weight, then weight loss might not help enough. In that case she can try CPAP or we can refer her to learn about trying a fitted mouth piece to wear at night.  She should have a ROV scheduled after her first visit. We can bring her in to see me sooner to talk over options face to face if that would be helpful.

## 2020-07-24 ENCOUNTER — Telehealth: Payer: Self-pay

## 2020-07-24 NOTE — Telephone Encounter (Signed)
Patient called stating she is scheduled for an appointment with Ladona Ridgel on Monday, 07/27/20.  Patient is not sure what the appointment is regarding.  Patient was told that it was to discuss Tremfya medication.  Patient states she will need to apply for patient assistance or a co-pay card before starting the medication.  Patient requested a return call to let her know if she needs to apply for assistance before coming in for an appointment with Ladona Ridgel of if she can do that at her appointment.  Patient states she doesn't want to come in on Tuesday to discuss the medication and then have to come back for another appointment to apply for the assistance.

## 2020-07-24 NOTE — Telephone Encounter (Signed)
Spoke with patient and advised the BIV has been done on the [place]. Patient advised she can sign up for a co-pay card. Patient advised her appointment is to discuss the medication and to sign consent. Patient advised we will schedule her to receive her first injection in the office and will send prescription at that appointment. Patient expressed understanding.

## 2020-07-27 ENCOUNTER — Ambulatory Visit (INDEPENDENT_AMBULATORY_CARE_PROVIDER_SITE_OTHER): Payer: 59 | Admitting: Physician Assistant

## 2020-07-27 ENCOUNTER — Telehealth: Payer: Self-pay

## 2020-07-27 ENCOUNTER — Encounter: Payer: Self-pay | Admitting: Physician Assistant

## 2020-07-27 ENCOUNTER — Other Ambulatory Visit: Payer: Self-pay

## 2020-07-27 VITALS — BP 116/72 | HR 101 | Resp 16 | Ht 68.0 in | Wt 194.8 lb

## 2020-07-27 DIAGNOSIS — M25551 Pain in right hip: Secondary | ICD-10-CM

## 2020-07-27 DIAGNOSIS — M19041 Primary osteoarthritis, right hand: Secondary | ICD-10-CM

## 2020-07-27 DIAGNOSIS — M19042 Primary osteoarthritis, left hand: Secondary | ICD-10-CM

## 2020-07-27 DIAGNOSIS — Z79899 Other long term (current) drug therapy: Secondary | ICD-10-CM | POA: Diagnosis not present

## 2020-07-27 DIAGNOSIS — G629 Polyneuropathy, unspecified: Secondary | ICD-10-CM

## 2020-07-27 DIAGNOSIS — L403 Pustulosis palmaris et plantaris: Secondary | ICD-10-CM | POA: Diagnosis not present

## 2020-07-27 DIAGNOSIS — Z87891 Personal history of nicotine dependence: Secondary | ICD-10-CM

## 2020-07-27 DIAGNOSIS — L4050 Arthropathic psoriasis, unspecified: Secondary | ICD-10-CM

## 2020-07-27 DIAGNOSIS — M25552 Pain in left hip: Secondary | ICD-10-CM

## 2020-07-27 DIAGNOSIS — R202 Paresthesia of skin: Secondary | ICD-10-CM

## 2020-07-27 DIAGNOSIS — Z8639 Personal history of other endocrine, nutritional and metabolic disease: Secondary | ICD-10-CM

## 2020-07-27 DIAGNOSIS — E781 Pure hyperglyceridemia: Secondary | ICD-10-CM

## 2020-07-27 DIAGNOSIS — G8929 Other chronic pain: Secondary | ICD-10-CM

## 2020-07-27 DIAGNOSIS — M25462 Effusion, left knee: Secondary | ICD-10-CM

## 2020-07-27 DIAGNOSIS — Z8261 Family history of arthritis: Secondary | ICD-10-CM

## 2020-07-27 DIAGNOSIS — M170 Bilateral primary osteoarthritis of knee: Secondary | ICD-10-CM

## 2020-07-27 NOTE — Telephone Encounter (Addendum)
Patient enrolled into TremfyaWithMe copay card program. Sending patient a copy of the card via MyChart.  BIN: [id number] Group: [id number] ID: [id number]  Benefit period: 07/27/2020-01/16/2021

## 2020-07-27 NOTE — Patient Instructions (Signed)
Standing Labs We placed an order today for your standing lab work.   Please have your standing labs drawn in 1 month then every 3 months   If possible, please have your labs drawn 2 weeks prior to your appointment so that the provider can discuss your results at your appointment.  Please note that you may see your imaging and lab results in MyChart before we have reviewed them. We may be awaiting multiple results to interpret others before contacting you. Please allow our office up to 72 hours to thoroughly review all of the results before contacting the office for clarification of your results.  We have open lab daily: Monday through Thursday from 1:30-4:30 PM and Friday from 1:30-4:00 PM at the office of Dr. Shaili Deveshwar, [facility].   Please be advised, all patients with office appointments requiring lab work will take precedent over walk-in lab work.  If possible, please come for your lab work on Monday and Friday afternoons, as you may experience shorter wait times. The office is located at [address], [address], [place], [place] [address] No appointment is necessary.   Labs are drawn by [facility]. Please bring your co-pay at the time of your lab draw.  You may receive a bill from [facility] for your lab work.  If you wish to have your labs drawn at another location, please call the office 24 hours in advance to send orders.  If you have any questions regarding directions or hours of operation,  please call [phone].   As a reminder, please drink plenty of water prior to coming for your lab work. Thanks!   Guselkumab injection What is this medication? GUSELKUMAB (goo ZELK ue mab) is used to treat plaque psoriasis and psoriaticarthritis. This medicine may be used for other purposes; ask your health care provider orpharmacist if you have questions. COMMON BRAND NAME(S): Tremfya What should I tell my care team before I take this medication? They need to know if you  have any of these conditions: immune system problems infection (especially a virus infection such as chickenpox, cold sores, or herpes) or history of infections recently received or scheduled to receive a vaccine tuberculosis, a positive skin test for tuberculosis, or have recently been in close contact with someone who has tuberculosis an unusual or allergic reaction to guselkumab, other medicines, foods, dyes, or preservatives pregnant or trying to get pregnant breast-feeding How should I use this medication? This medicine is for injection under the skin. It may be administered by a healthcare professional in a hospital or clinic setting or at home. If you get this medicine at home, you will be taught how to prepare and give this medicine. Use exactly as directed. Take your medicine at regular intervals. Donot take your medicine more often than directed. It is important that you put your used injectors, needles and syringes in a special sharps container. Do not put them in a trash can. If you do not have asharps container, call your pharmacist or healthcare provider to get one. A special MedGuide will be given to you by the pharmacist with eachprescription and refill. Be sure to read this information carefully each time. Talk to your pediatrician regarding the use of this medicine in children.Special care may be needed. Overdosage: If you think you have taken too much of this medicine contact apoison control center or emergency room at once. NOTE: This medicine is only for you. Do not share this medicine with others. What if I miss   a dose? It is important not to miss your dose. Call your doctor of health care professional if you are unable to keep an appointment. If you give yourself the medicine and you miss a dose, take it as soon as you can. Then, take your nextdose at your regular scheduled time. What may interact with this medication? Do not take this medicine with any of the following  medications: live virus vaccines This medicine may also interact with the following medications: amoxapine certain medicines for depression, anxiety, or psychotic disturbances like amitriptyline, clomipramine, desipramine, doxepin, imipramine, maprotiline, nortriptyline, protriptyline, trimipramine codeine inactivated vaccines methadone pimozide thioridazine This list may not describe all possible interactions. Give your health care provider a list of all the medicines, herbs, non-prescription drugs, or dietary supplements you use. Also tell them if you smoke, drink alcohol, or use illegaldrugs. Some items may interact with your medicine. What should I watch for while using this medication? Your condition will be monitored carefully while you are receiving this medicine. Tell your doctor or healthcare professional if your symptoms do notstart to get better or if they get worse. You will be tested for tuberculosis (TB) before you start this medicine. If your doctor prescribes any medicine for TB, you should start taking the TB medicine before starting this medicine. Make sure to finish the full course ofTB medicine. Call your doctor or health care professional if you get a cold or other infection while receiving this medicine. Do not treat yourself. This medicinemay decrease your body's ability to fight infection. What side effects may I notice from receiving this medication? Side effects that you should report to your doctor or health care professionalas soon as possible: allergic reactions like skin rash, itching or hives, swelling of the face, lips, or tongue breathing problems chest pain or chest tightness dizziness, feeling faint or lightheaded signs and symptoms of infection like fever or chills; cough; sore throat; pain or trouble passing urine Side effects that usually do not require medical attention (report these toyour doctor or health care professional if they continue or are  bothersome): diarrhea headache joint pain pain, redness, irritation at site where injected This list may not describe all possible side effects. Call your doctor for medical advice about side effects. You may report side effects to FDA at1-800-FDA-1088. Where should I keep my medication? Keep out of the reach of children. Store unopened syringes or injectors in a refrigerator between 2 to 8 degrees C (36 to 46 degrees F). Keep in the original container until ready for use. Protect from light. Do not freeze. Do not shake. Prior to use, remove the syringe or injector from the refrigerator and use after 30 minutes at room temperature. Throw away any unused medicine after the expiration date on thelabel. NOTE: This sheet is a summary. It may not cover all possible information. If you have questions about this medicine, talk to your doctor, pharmacist, orhealth care provider.  2022 Elsevier/Gold Standard (2018-08-01 15:10:10)  

## 2020-07-28 ENCOUNTER — Other Ambulatory Visit: Payer: 59

## 2020-08-03 ENCOUNTER — Ambulatory Visit: Payer: 59 | Admitting: Pharmacist

## 2020-08-03 ENCOUNTER — Other Ambulatory Visit: Payer: Self-pay

## 2020-08-03 VITALS — BP 131/79

## 2020-08-03 DIAGNOSIS — L4050 Arthropathic psoriasis, unspecified: Secondary | ICD-10-CM

## 2020-08-03 DIAGNOSIS — L403 Pustulosis palmaris et plantaris: Secondary | ICD-10-CM

## 2020-08-03 MED ORDER — TREMFYA 100 MG/ML ~~LOC~~ SOAJ: SUBCUTANEOUS | 1 refills | Status: DC

## 2020-08-03 NOTE — Patient Instructions (Signed)
Your next Tremfya dose is due on 08/31/20, 10/26/20, and every 8 weeks thereafter  Discontinue Otezla.  Your prescription will be shipped from [facility]. Their phone number is [phone] Please call to schedule shipment and confirm address. They will mail medication to your home.  Your copay should be affordable. If you call the pharmacy and it is not affordable, please double-check that they are billing through your copay card as secondary coverage. That copay card information is: BIN: [id number] Group: [id number] ID: [id number]  Labs are due in 1 month then every 3 months.  Remember the 5 C's: COUNTER - leave on the counter at least 30 minutes but up to overnight to bring medication to room temperature. This may help prevent stinging COLD - place something cold (like an ice gel pack or cold water bottle) on the injection site just before cleansing with alcohol. This may help reduce pain CLARITIN - use Claritin (generic name is loratadine) for the first two weeks of treatment or the day of, the day before, and the day after injecting. This will help to minimize injection site reactions CORTISONE CREAM - apply if injection site is irritated and itching CALL ME - if injection site reaction is bigger than the size of your fist, looks infected, blisters, or if you develop hives

## 2020-08-03 NOTE — Progress Notes (Signed)
Pharmacy Note  Subjective:   Patient presents to clinic today to receive first dose of Tremfya. She is switching therapy from [place] (her last dose of Otezla was 08/01/20). She is naive to injectable medications  Patient running a fever or have signs/symptoms of infection? No  Patient currently on antibiotics for the treatment of infection? No  Patient have any upcoming invasive procedures/surgeries? No  Objective: CMP     Component Value Date/Time   NA 140 07/09/2020 1532   NA 141 06/14/2016 0832   K 4.8 07/09/2020 1532   CL 102 07/09/2020 1532   CO2 26 07/09/2020 1532   GLUCOSE 81 07/09/2020 1532   BUN 22 07/09/2020 1532   BUN 11 06/14/2016 0832   CREATININE 0.79 07/09/2020 1532   CALCIUM 9.8 07/09/2020 1532   PROT 7.2 07/09/2020 1532   PROT 7.2 07/09/2020 1532   ALBUMIN 4.2 12/11/2019 0847   AST 20 07/09/2020 1532   ALT 18 07/09/2020 1532   ALKPHOS 119 (H) 12/11/2019 0847   BILITOT 0.3 07/09/2020 1532   GFRNONAA 80 07/09/2020 1532   GFRAA 92 07/09/2020 1532    CBC    Component Value Date/Time   WBC 9.6 07/09/2020 1532   RBC 4.59 07/09/2020 1532   HGB 13.4 07/09/2020 1532   HGB WILL FOLLOW 06/14/2016 0832   HCT 40.0 07/09/2020 1532   HCT WILL FOLLOW 06/14/2016 0832   PLT 443 (H) 07/09/2020 1532   PLT WILL FOLLOW 06/14/2016 0832   MCV 87.1 07/09/2020 1532   MCV WILL FOLLOW 06/14/2016 0832   MCH 29.2 07/09/2020 1532   MCHC 33.5 07/09/2020 1532   RDW 13.1 07/09/2020 1532   RDW WILL FOLLOW 06/14/2016 0832   LYMPHSABS 3,197 07/09/2020 1532   LYMPHSABS WILL FOLLOW 06/14/2016 0832   MONOABS 0.7 12/11/2019 0847   EOSABS 298 07/09/2020 1532   EOSABS WILL FOLLOW 06/14/2016 0832   BASOSABS 48 07/09/2020 1532   BASOSABS WILL FOLLOW 06/14/2016 0832    Baseline Immunosuppressant Therapy Labs TB GOLD Quantiferon TB Gold Latest Ref Rng & Units 07/09/2020  Quantiferon TB Gold Plus NEGATIVE NEGATIVE   Hepatitis Panel Hepatitis Latest Ref Rng & Units 02/14/2018  Hep B  Surface Ag NON-REACTI NON-REACTIVE  Hep B IgM NON-REACTI NON-REACTIVE  Hep C Ab NON-REACTI NON-REACTIVE  Hep C Ab NON-REACTI NON-REACTIVE   HIV Lab Results  Component Value Date   HIV NON-REACTIVE 07/09/2020   Immunoglobulins Immunoglobulin Electrophoresis Latest Ref Rng & Units 07/09/2020  IgA  70 - 320 mg/dL 443  IgG 154 - 0,086 mg/dL 7,619  IgM 50 - 509 mg/dL 326   SPEP Serum Protein Electrophoresis Latest Ref Rng & Units 07/09/2020  Total Protein 6.1 - 8.1 g/dL 7.2  Albumin 3.8 - 4.8 g/dL 4.2  Alpha-1 0.2 - 0.3 g/dL 0.3  Alpha-2 0.5 - 0.9 g/dL 0.8  Beta Globulin 0.4 - 0.6 g/dL 0.4  Beta 2 0.2 - 0.5 g/dL 0.4  Gamma Globulin 0.8 - 1.7 g/dL 1.1   Chest x-ray: 71/02/4578 - no active cardiopulmonary disease  Assessment/Plan:  Counseled patient that Tremfya is a IL-23 inhibitor.  Counseled patient on purpose, proper use, and adverse effects of Tremfya.  Reviewed the most common adverse effects including infection, URTIs, injection site reactions, nausea/diarrhea, and recurrence of tinea and HSV infections Counseled patient that Tremfya should be held for infection and prior to scheduled surgery.  Recommend annual influenza, PCV 15 or PCV20 or Pneumovax 23, and Shingrix as indicated.  She has received 2-dose Shingrix vaccine.  She has received one dose of pneumonia vaccine.  Reviewed the importance of regular labs while on Tremfya therapy.  Will monitor CBC and CMP 1 month after starting and then every 3 months routinely thereafter. Will monitor TB gold annually. Provided patient with medication education material and answered all questions.  Patient voiced understanding.  Patient consented to [facility].  Will upload consent into the media tab.  Reviewed storage instructions of Tremfya.    Demonstrated proper injection technique with Tremfya demo device  Patient able to demonstrate proper injection technique using the teach back method.  Patient self injected in the right thigh  with:  Sample Medication: Tremgya 100mg /mL auto-injector pen NDC: [phone] Lot: [id number] Expiration: 11/2021  Patient tolerated well.  Observed for 30 mins in office for adverse reaction and none noted.   Patient is to return in 1 month for labs and 6-8 weeks for follow-up appointment.  Standing orders placed.   Tremfya approved through insurance.  Prescription sent to [facility] with copay card information. Patient provided with copay card information.  Dose will be Tremfya 100mg  mg on weeks 0 (received today in clinic on 08/03/20), Week 4 then every 8 weeks thereafter  All questions encouraged and answered.  Instructed patient to call with any further questions or concerns.  , PharmD, MPH, BCPS Clinical Pharmacist (Rheumatology and Pulmonology)  08/03/2020 8:45 AM

## 2020-08-12 ENCOUNTER — Ambulatory Visit
Admission: RE | Admit: 2020-08-12 | Discharge: 2020-08-12 | Disposition: A | Discharge status: Home / Self Care | Payer: 59 | Source: Ambulatory Visit | Attending: Orthopedic Surgery | Admitting: Orthopedic Surgery

## 2020-08-12 ENCOUNTER — Other Ambulatory Visit: Payer: Self-pay

## 2020-08-12 DIAGNOSIS — M1990 Unspecified osteoarthritis, unspecified site: Secondary | ICD-10-CM

## 2020-08-12 DIAGNOSIS — M25559 Pain in unspecified hip: Secondary | ICD-10-CM

## 2020-08-12 IMAGING — MR MR HIP*L* W/O CM
2 series · 40 of 40 positions shown · non-contrast
Comparison: X-ray [DATE].

CLINICAL DATA: Bilateral hip pain.  No known injury.

EXAM:
MR OF THE RIGHT HIP WITHOUT CONTRAST
MR OF THE LEFT HIP WITHOUT CONTRAST
TECHNIQUE: Multiplanar, multisequence MR imaging was performed. No intravenous
contrast was administered.

[Series 1: PD fat-sat · coronal · 4.0mm · 0.70mm/px · 17 of 19 slices shown (1 of 2)]
[im 1/19]
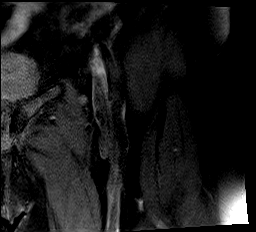
[im 2/19]
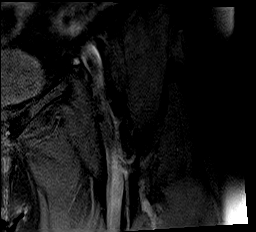
[im 3/19]
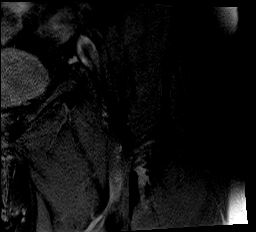
[im 4/19]
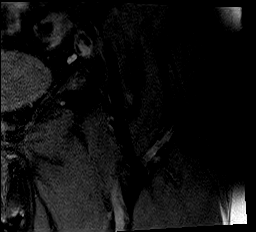
[im 5/19]
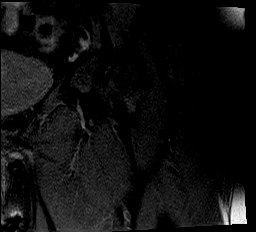
[im 6/19]
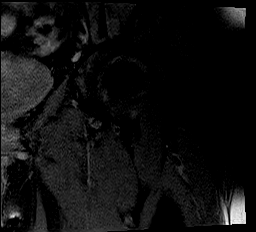
[im 7/19]
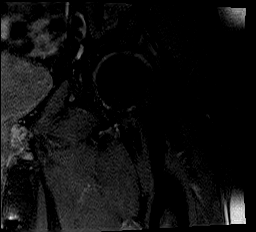
[im 8/19]
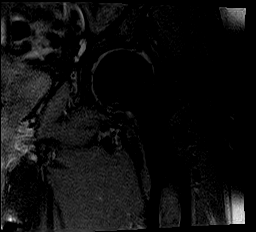
[im 10/19]
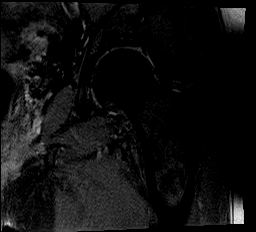
[im 11/19]
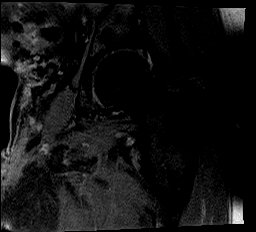
[im 12/19]
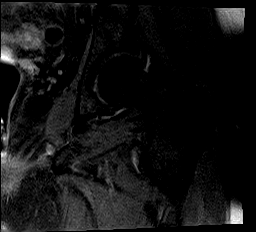
[im 13/19]
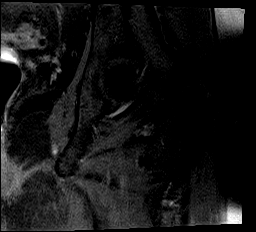
[im 14/19]
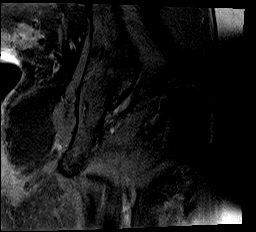
[im 15/19]
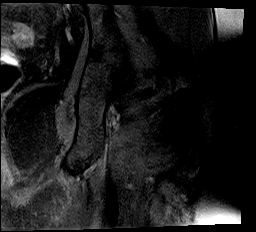
[im 16/19]
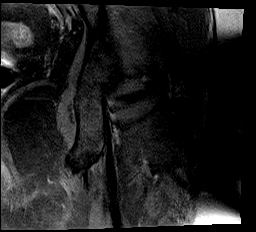
[im 17/19]
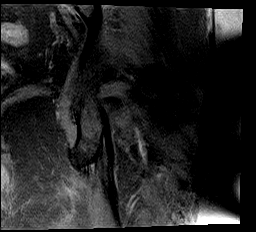
[im 19/19]
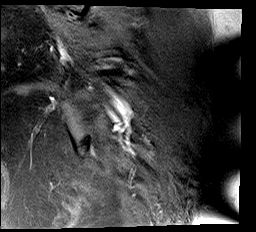

[Series 2: PD fat-sat · sagittal · 4.0mm · 0.70mm/px · 23 of 27 slices shown (2 of 2)]
[im 1/27]
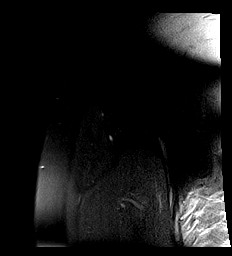
[im 2/27]
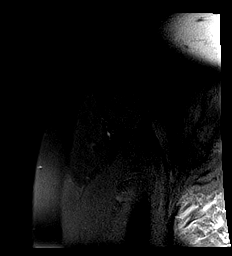
[im 3/27]
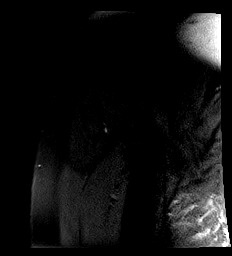
[im 4/27]
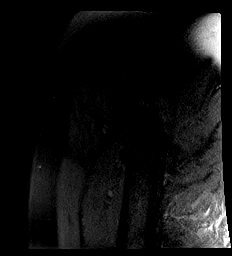
[im 5/27]
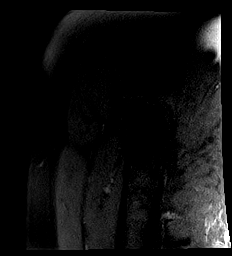
[im 6/27]
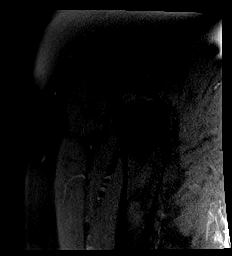
[im 8/27]
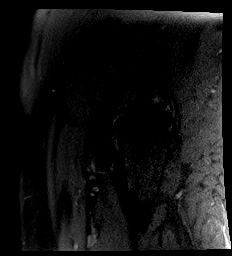
[im 9/27]
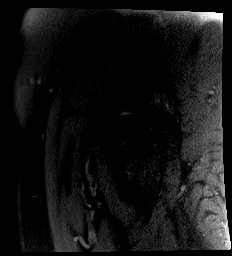
[im 10/27]
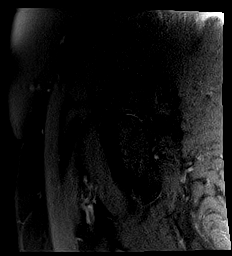
[im 11/27]
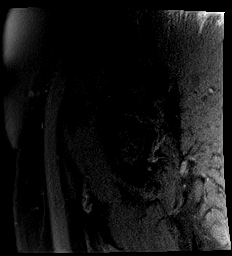
[im 12/27]
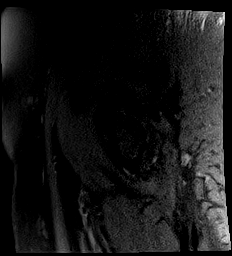
[im 14/27]
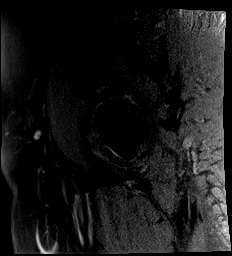
[im 15/27]
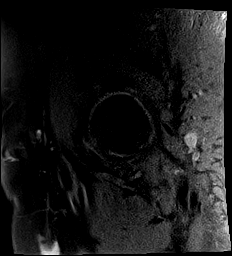
[im 16/27]
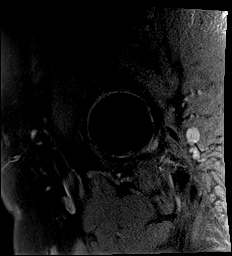
[im 17/27]
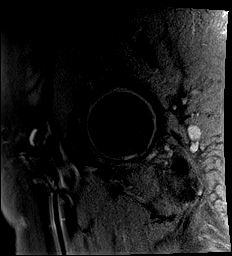
[im 18/27]
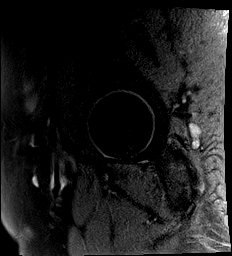
[im 19/27]
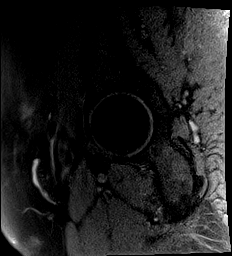
[im 21/27]
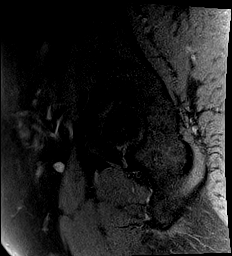
[im 22/27]
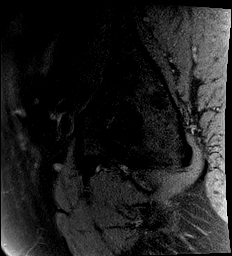
[im 23/27]
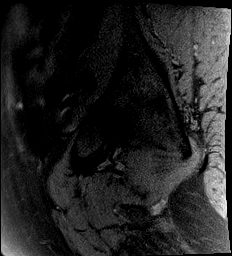
[im 24/27]
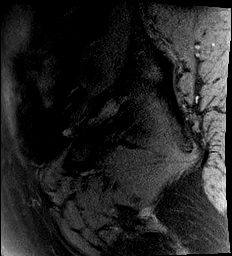
[im 25/27]
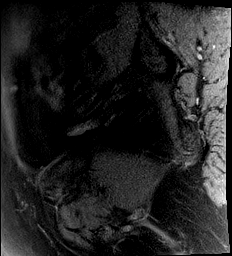
[im 27/27]
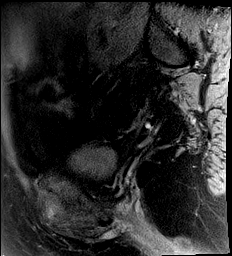

[40 of 40 positions shown; findings below may reference images not displayed]

FINDINGS: Bones: No acute fracture. No dislocation. No femoral head avascular
necrosis. Visualized portion of the pelvis is intact without
fracture or diastasis. Mild arthropathy of the pubic symphysis with
mild marrow edema involving the parasymphyseal aspect of the
bilateral pubic bones, left slightly worse than right (series 5,
image 22). No significant arthropathy of the visualized sacroiliac
joints. No additional sites of bone marrow edema. No marrow
replacing bone lesion. Partially visualized lower lumbar spondylosis
with prominent left-sided degenerative endplate spurring.

RIGHT HIP:

Articular cartilage and labrum

Articular cartilage: Moderate diffuse chondral thinning. Tiny
subchondral cyst formation within the posterior acetabulum.
Prominent femoral head marginal osteophytes.

Labrum: Extensive superior labral degenerative tearing with 10 x 4
mm paralabral cyst along the superior acetabular rim (series 8,
image 11).

Joint or bursal effusion

Joint effusion:  None.

Bursae: No abnormal bursal fluid collection.

Muscles and tendons

Muscles and tendons: Mild tendinosis with tiny insertional tear of
the right gluteus medius tendon. The hamstring, iliopsoas, rectus
femoris, and adductor tendons appear intact without tear or
significant tendinosis. Unremarkable muscle bulk and signal
intensity.

LEFT HIP:

Articular cartilage and labrum

Articular cartilage: Moderate diffuse chondral thinning. No
subchondral marrow signal abnormality.

Labrum: Superior labral degeneration with partial-thickness
undersurface tearing. No paralabral cyst.

Joint or bursal effusion

Joint effusion:  None.

Bursae: No abnormal bursal fluid collection.

Muscles and tendons

Muscles and tendons: The gluteal, hamstring, iliopsoas, rectus
femoris, and adductor tendons appear intact without tear or
significant tendinosis. Unremarkable muscle bulk and signal
intensity.

Other findings

Miscellaneous: No inguinal lymphadenopathy. Diverticular changes
within the sigmoid colon. No acute findings within the pelvis.
IMPRESSION: 1. Moderate osteoarthritis of the bilateral hips.
2. Mild tendinosis with tiny insertional tear of the right gluteus
medius tendon.
3. Mild arthropathy of the pubic symphysis with mild marrow edema
involving the parasymphyseal aspect of the bilateral pubic bones,
left slightly worse than right. Given the sclerotic appearance on
prior radiographs, findings likely represent osteitis pubis.

## 2020-08-12 IMAGING — MR MR HIP*R* W/O CM
4 of 6 series · 21 of 40 positions shown · non-contrast
Comparison: X-ray [DATE].

CLINICAL DATA: Bilateral hip pain.  No known injury.

EXAM:
MR OF THE RIGHT HIP WITHOUT CONTRAST
MR OF THE LEFT HIP WITHOUT CONTRAST
TECHNIQUE: Multiplanar, multisequence MR imaging was performed. No intravenous
contrast was administered.

[Series 3: T1 · coronal · 4.0mm · 0.78mm/px · 7 of 23 slices shown]
[im 1/23]
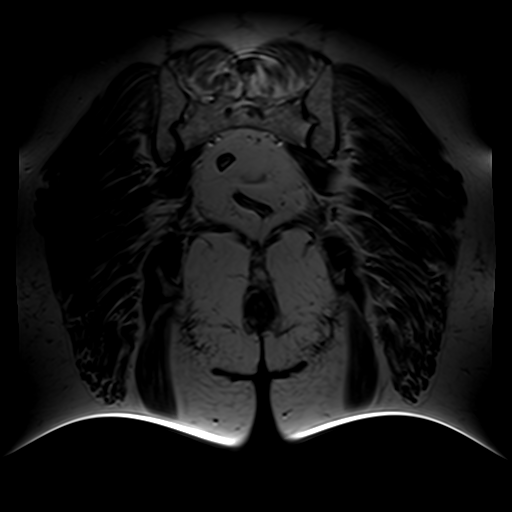
[im 4/23]
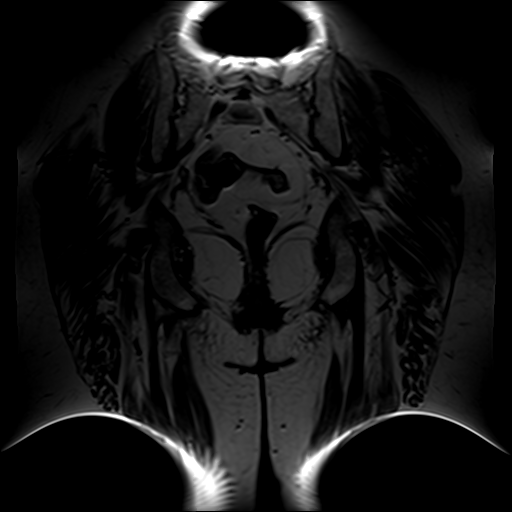
[im 8/23]
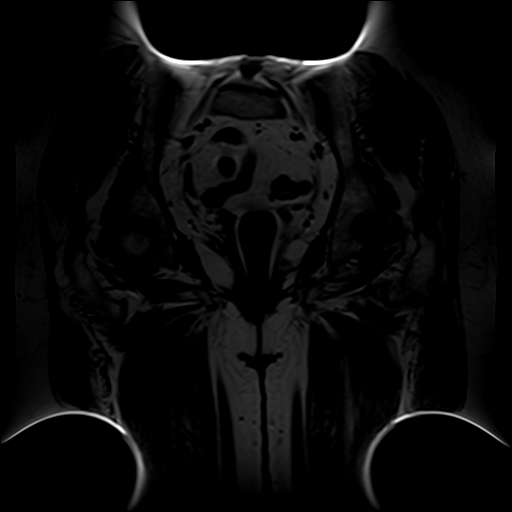
[im 12/23]
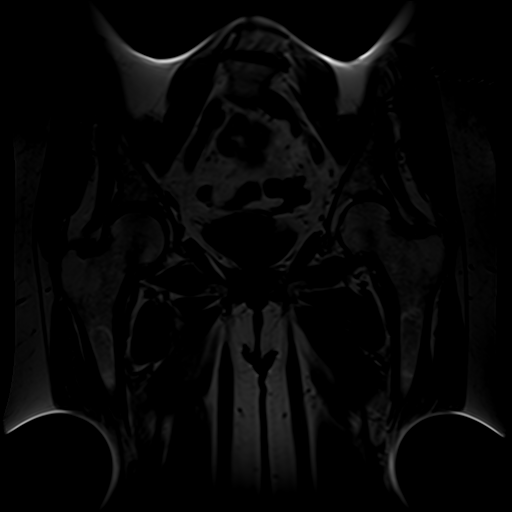
[im 15/23]
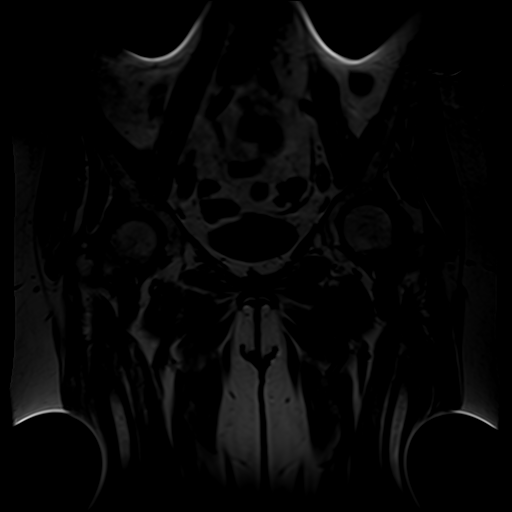
[im 19/23]
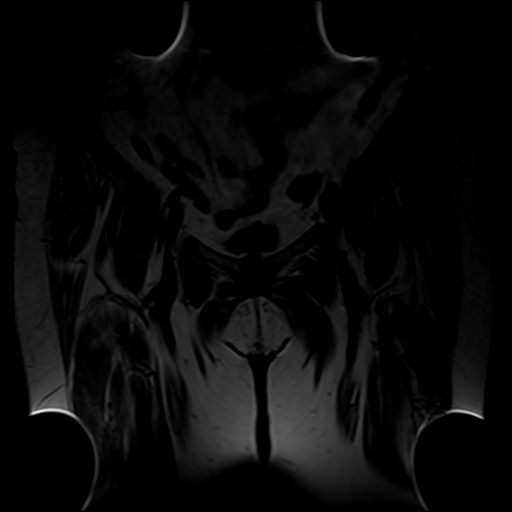
[im 23/23]
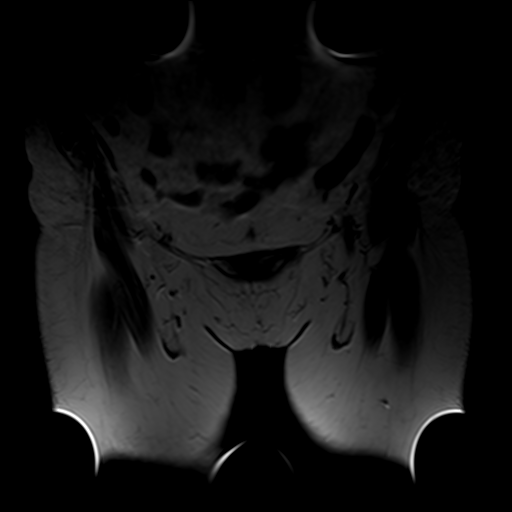

[Series 4: T2 fat-sat · coronal · 4.0mm · 0.74mm/px · 6 of 24 slices shown (1 of 2)]
[im 1/24]
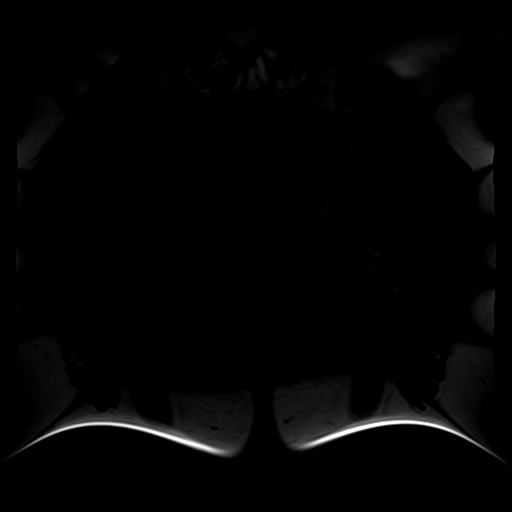
[im 5/24]
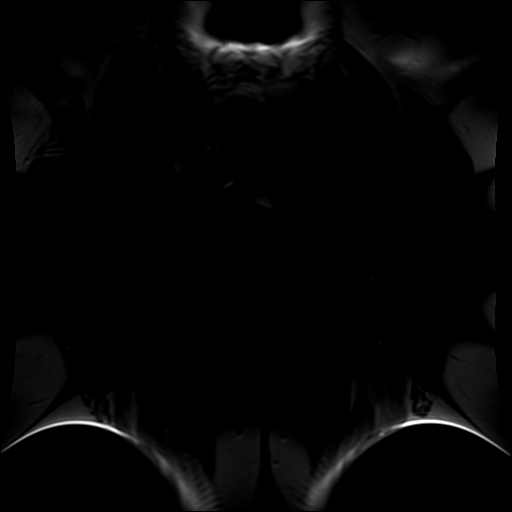
[im 10/24]
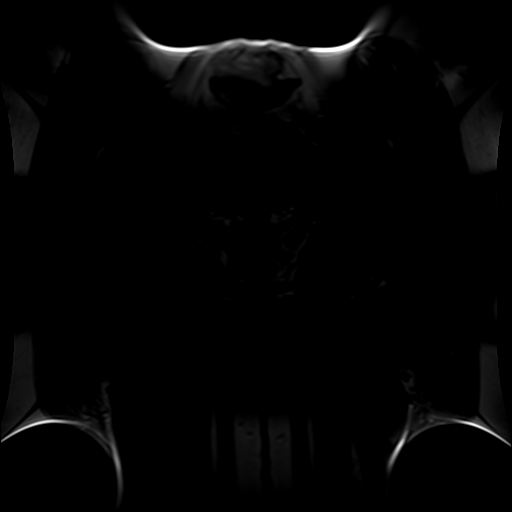
[im 14/24]
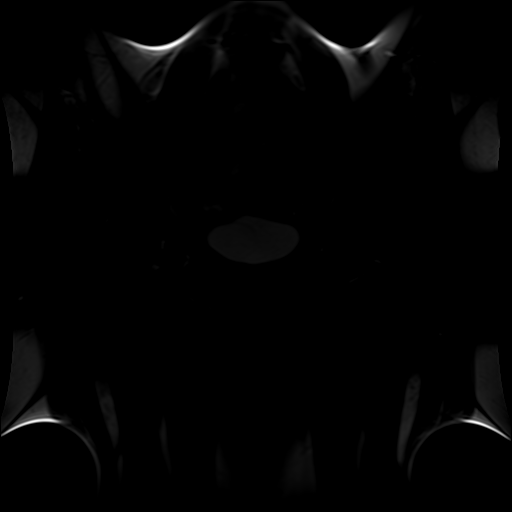
[im 19/24]
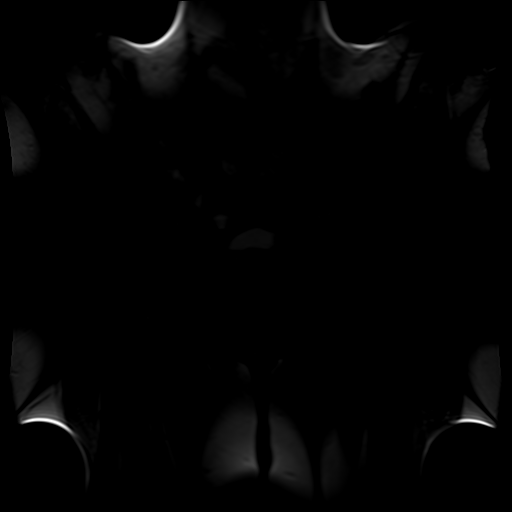
[im 24/24]
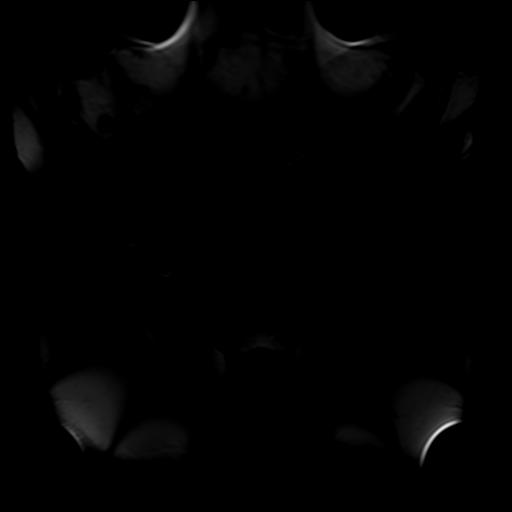

[Series 5: T2 fat-sat · axial · 4.0mm · 0.78mm/px · z∈[-50,+100]mm · 5 of 36 slices shown (2 of 2)]
[im 1/36]
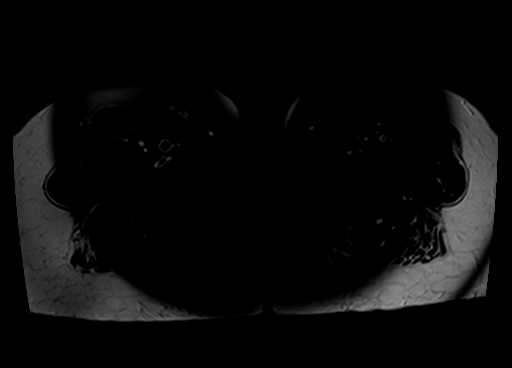
[im 5/36]
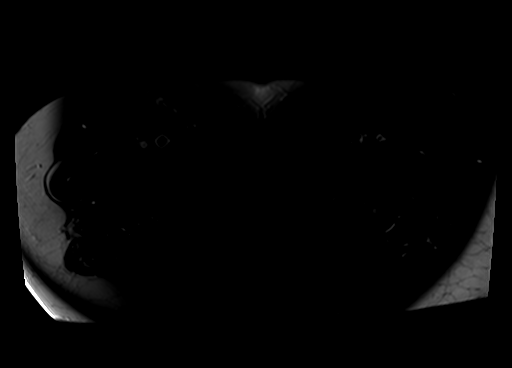
[im 9/36]
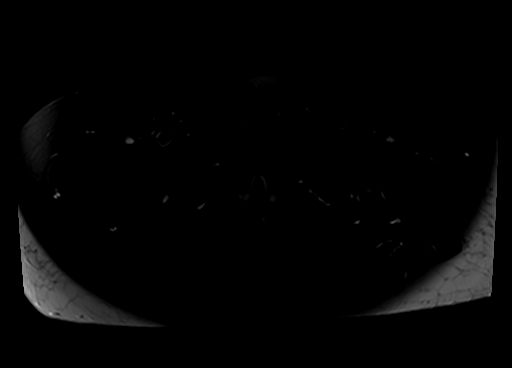
[im 18/36]
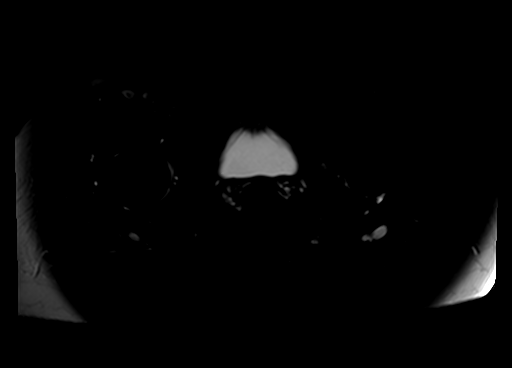
[im 31/36]
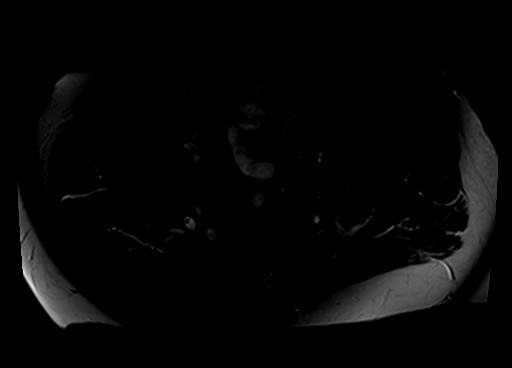

[Series 6: STIR · coronal · 3.0mm · 1.48mm/px · 3 of 24 slices shown]
[im 5/24]
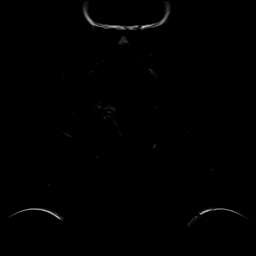
[im 14/24]
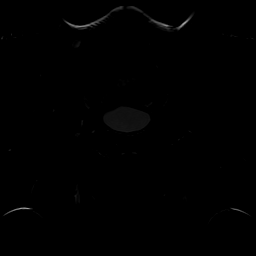
[im 24/24]
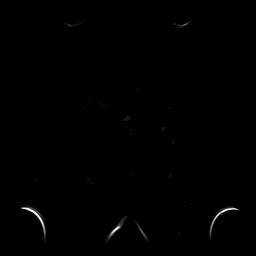

[21 of 40 positions shown; findings below may reference images not displayed]

FINDINGS: Bones: No acute fracture. No dislocation. No femoral head avascular
necrosis. Visualized portion of the pelvis is intact without
fracture or diastasis. Mild arthropathy of the pubic symphysis with
mild marrow edema involving the parasymphyseal aspect of the
bilateral pubic bones, left slightly worse than right (series 5,
image 22). No significant arthropathy of the visualized sacroiliac
joints. No additional sites of bone marrow edema. No marrow
replacing bone lesion. Partially visualized lower lumbar spondylosis
with prominent left-sided degenerative endplate spurring.

RIGHT HIP:

Articular cartilage and labrum

Articular cartilage: Moderate diffuse chondral thinning. Tiny
subchondral cyst formation within the posterior acetabulum.
Prominent femoral head marginal osteophytes.

Labrum: Extensive superior labral degenerative tearing with 10 x 4
mm paralabral cyst along the superior acetabular rim (series 8,
image 11).

Joint or bursal effusion

Joint effusion:  None.

Bursae: No abnormal bursal fluid collection.

Muscles and tendons

Muscles and tendons: Mild tendinosis with tiny insertional tear of
the right gluteus medius tendon. The hamstring, iliopsoas, rectus
femoris, and adductor tendons appear intact without tear or
significant tendinosis. Unremarkable muscle bulk and signal
intensity.

LEFT HIP:

Articular cartilage and labrum

Articular cartilage: Moderate diffuse chondral thinning. No
subchondral marrow signal abnormality.

Labrum: Superior labral degeneration with partial-thickness
undersurface tearing. No paralabral cyst.

Joint or bursal effusion

Joint effusion:  None.

Bursae: No abnormal bursal fluid collection.

Muscles and tendons

Muscles and tendons: The gluteal, hamstring, iliopsoas, rectus
femoris, and adductor tendons appear intact without tear or
significant tendinosis. Unremarkable muscle bulk and signal
intensity.

Other findings

Miscellaneous: No inguinal lymphadenopathy. Diverticular changes
within the sigmoid colon. No acute findings within the pelvis.
IMPRESSION: 1. Moderate osteoarthritis of the bilateral hips.
2. Mild tendinosis with tiny insertional tear of the right gluteus
medius tendon.
3. Mild arthropathy of the pubic symphysis with mild marrow edema
involving the parasymphyseal aspect of the bilateral pubic bones,
left slightly worse than right. Given the sclerotic appearance on
prior radiographs, findings likely represent osteitis pubis.

## 2020-09-14 NOTE — Progress Notes (Deleted)
Office Visit Note  Patient: Ann Newton             Date of Birth: 03/25/1955           MRN: [id number]             PCP: Ann Sax, MD Referring: Ann Newton,* Visit Date: 09/28/2020 Occupation: @GUAROCC @  Subjective:  No chief complaint on file.   History of Present Illness: Ann Newton is a 65 y.o. female ***   Activities of Daily Living:  Patient reports morning stiffness for *** {minute/hour:19697}.   Patient {ACTIONS;DENIES/REPORTS:21021675::"Denies"} nocturnal pain.  Difficulty dressing/grooming: {ACTIONS;DENIES/REPORTS:21021675::"Denies"} Difficulty climbing stairs: {ACTIONS;DENIES/REPORTS:21021675::"Denies"} Difficulty getting out of chair: {ACTIONS;DENIES/REPORTS:21021675::"Denies"} Difficulty using hands for taps, buttons, cutlery, and/or writing: {ACTIONS;DENIES/REPORTS:21021675::"Denies"}  No Rheumatology ROS completed.   PMFS History:  Patient Active Problem List   Diagnosis Date Noted   Thyroid nodule greater than or equal to 1 cm in diameter incidentally noted on imaging study 11/20/2018   Fatigue 11/09/2018   Elevated glucose 11/09/2018   Snores 11/09/2018   Hair loss 11/09/2018   Need for influenza vaccination 11/09/2018   Thyromegaly 11/09/2018   Allergic reaction 05/17/2018   Maculopapular rash 05/17/2018   Family history of rheumatoid arthritis 03/09/2018   History of vitamin D deficiency 03/09/2018   Primary osteoarthritis of both knees 02/22/2018   Primary osteoarthritis of both hands 02/22/2018   Neuropathy 11/22/2017   Chronic pain of both knees 11/22/2017   Need for shingles vaccine 11/22/2017   Vitamin D deficiency 01/24/2016   Smoking hx- ( less 1 ppd * 15 yrs) - Quit Jan 17 2013 12/28/2015   Elevated LDL cholesterol level 12/28/2015   Elevated vitamin B12 level 12/28/2015   h/o Mild peripheral edema- takes HCTZ for this 12/20/2015   Dietary B12 deficiency 12/20/2015   History of chickenpox ( Aug '17 )   12/20/2015   Healthcare maintenance 12/20/2015   Overweight 12/03/2015   h/o Hypertriglyceridemia 12/03/2015   h/o Hyperlipidemia 12/03/2015   Pustular psoriasis of palms and soles 10/28/2010    Past Medical History:  Diagnosis Date   Arthritis    Hyperlipidemia    diet controlled   Psoriasis    Thyroid disease    Wears contact lenses     Family History  Problem Relation Age of Onset   Kidney disease Mother    Diabetes Mother    Heart attack Father    Rheum arthritis Brother    Healthy Daughter    Healthy Son    Heart attack Maternal Grandmother    Heart attack Maternal Grandfather    Cancer Paternal Grandmother    Heart attack Paternal Grandfather    Rheum arthritis Brother    Colon cancer Neg Hx    Colon polyps Neg Hx    Esophageal cancer Neg Hx    Rectal cancer Neg Hx    Stomach cancer Neg Hx    Past Surgical History:  Procedure Laterality Date   ABDOMINAL HYSTERECTOMY  2000   part   ANTERIOR AND POSTERIOR REPAIR N/A 09/08/2016   Procedure: ANTERIOR (CYSTOCELE) AND POSTERIOR REPAIR (RECTOCELE)/Perineorrhaphy;  Surgeon: 09/10/2016, MD;  Location: [facility];  Service: Gynecology;  Laterality: N/A;  Requests Ann Newton. Repair of enterocele/posterior   COLONOSCOPY     ~12 yrs ago- normal per pt    COSMETIC SURGERY  1999   breast implants   HAGLAND'S DEFORMITY EXCISION Left 09/13/2013   Procedure: LEFT FOOT: EXCISION INTERDIGITAL MORTON'S NEUROMA SINGLE EACH;  Surgeon:  Ann Newton., MD;  Location: [facility];  Service: Orthopedics;  Laterality: Left;   KNEE ARTHROSCOPY  2013,2011   right/left   SHOULDER ARTHROSCOPY     rt   TUBAL LIGATION     WISDOM TOOTH EXTRACTION     Social History   Social History Narrative   Not on file   Immunization History  Administered Date(s) Administered   Influenza,inj,Quad PF,6+ Mos 12/03/2015, 11/22/2017, 11/09/2018, 12/11/2019   Moderna Sars-Covid-2 Vaccination 08/19/2019, 09/16/2019   Pneumococcal  Polysaccharide-23 11/22/2017   Tdap 02/09/2009, 05/29/2019   Zoster Recombinat (Shingrix) 11/09/2018, 05/29/2019     Objective: Vital Signs: There were no vitals taken for this visit.   Physical Exam   Musculoskeletal Exam: ***  CDAI Exam: CDAI Score: -- Patient Global: --; Provider Global: -- Swollen: --; Tender: -- Joint Exam 09/28/2020   No joint exam has been documented for this visit   There is currently no information documented on the homunculus. Go to the Rheumatology activity and complete the homunculus joint exam.  Investigation: No additional findings.  Imaging: No results found.  Recent Labs: Lab Results  Component Value Date   WBC 9.6 07/09/2020   HGB 13.4 07/09/2020   PLT 443 (H) 07/09/2020   NA 140 07/09/2020   K 4.8 07/09/2020   CL 102 07/09/2020   CO2 26 07/09/2020   GLUCOSE 81 07/09/2020   BUN 22 07/09/2020   CREATININE 0.79 07/09/2020   BILITOT 0.3 07/09/2020   ALKPHOS 119 (H) 12/11/2019   AST 20 07/09/2020   ALT 18 07/09/2020   PROT 7.2 07/09/2020   PROT 7.2 07/09/2020   ALBUMIN 4.2 12/11/2019   CALCIUM 9.8 07/09/2020   GFRAA 92 07/09/2020   QFTBGOLDPLUS NEGATIVE 07/09/2020    Speciality Comments: Otezla stopped 08/01/20 Tremfya started 08/03/20  Procedures:  No procedures performed Allergies: Patient has no known allergies.   Assessment / Plan:     Visit Diagnoses: No diagnosis found.  Orders: No orders of the defined types were placed in this encounter.  No orders of the defined types were placed in this encounter.   Face-to-face time spent with patient was *** minutes. Greater than 50% of time was spent in counseling and coordination of care.  Follow-Up Instructions: No follow-ups on file.   Ann Newton, CMA  Note - This record has been created using Animal nutritionist.  Chart creation errors have been sought, but may not always  have been located. Such creation errors do not reflect on  the standard of medical  care.

## 2020-09-14 NOTE — Telephone Encounter (Signed)
MyChart message, nothing further required at this time.

## 2020-09-17 ENCOUNTER — Other Ambulatory Visit: Payer: Self-pay | Admitting: *Deleted

## 2020-09-17 DIAGNOSIS — Z79899 Other long term (current) drug therapy: Secondary | ICD-10-CM

## 2020-09-17 LAB — CBC WITH DIFFERENTIAL/PLATELET
Absolute Monocytes: 724 cells/uL (ref 200–950)
Basophils Absolute: 47 cells/uL (ref 0–200)
Basophils Relative: 0.5 %
Eosinophils Absolute: 291 cells/uL (ref 15–500)
Eosinophils Relative: 3.1 %
HCT: 40.6 % (ref 35.0–45.0)
Hemoglobin: 13.8 g/dL (ref 11.7–15.5)
Lymphs Abs: 3337 cells/uL (ref 850–3900)
MCH: 30 pg (ref 27.0–33.0)
MCHC: 34 g/dL (ref 32.0–36.0)
MCV: 88.3 fL (ref 80.0–100.0)
MPV: 10.7 fL (ref 7.5–12.5)
Monocytes Relative: 7.7 %
Neutro Abs: 5001 cells/uL (ref 1500–7800)
Neutrophils Relative %: 53.2 %
Platelets: 405 10*3/uL — ABNORMAL HIGH (ref 140–400)
RBC: 4.6 10*6/uL (ref 3.80–5.10)
RDW: 13.6 % (ref 11.0–15.0)
Total Lymphocyte: 35.5 %
WBC: 9.4 10*3/uL (ref 3.8–10.8)

## 2020-09-17 LAB — COMPLETE METABOLIC PANEL WITH GFR
AG Ratio: 1.4 (calc) (ref 1.0–2.5)
ALT: 18 U/L (ref 6–29)
AST: 19 U/L (ref 10–35)
Albumin: 4.2 g/dL (ref 3.6–5.1)
Alkaline phosphatase (APISO): 122 U/L (ref 37–153)
BUN: 16 mg/dL (ref 7–25)
CO2: 30 mmol/L (ref 20–32)
Calcium: 9.9 mg/dL (ref 8.6–10.4)
Chloride: 104 mmol/L (ref 98–110)
Creat: 0.84 mg/dL (ref 0.50–1.05)
Globulin: 2.9 g/dL (calc) (ref 1.9–3.7)
Glucose, Bld: 91 mg/dL (ref 65–99)
Potassium: 4.6 mmol/L (ref 3.5–5.3)
Sodium: 140 mmol/L (ref 135–146)
Total Bilirubin: 0.4 mg/dL (ref 0.2–1.2)
Total Protein: 7.1 g/dL (ref 6.1–8.1)
eGFR: 78 mL/min/{1.73_m2} (ref >=60)

## 2020-09-18 ENCOUNTER — Ambulatory Visit: Payer: 59 | Admitting: Neurology

## 2020-09-18 NOTE — Progress Notes (Signed)
Platelet count is borderline elevated-405.  Rest of CBC WNL.  CMP WNL.

## 2020-09-28 ENCOUNTER — Ambulatory Visit: Payer: 59 | Admitting: Physician Assistant

## 2020-09-28 DIAGNOSIS — Z79899 Other long term (current) drug therapy: Secondary | ICD-10-CM

## 2020-09-28 DIAGNOSIS — M170 Bilateral primary osteoarthritis of knee: Secondary | ICD-10-CM

## 2020-09-28 DIAGNOSIS — L403 Pustulosis palmaris et plantaris: Secondary | ICD-10-CM

## 2020-09-28 DIAGNOSIS — M19041 Primary osteoarthritis, right hand: Secondary | ICD-10-CM

## 2020-09-28 DIAGNOSIS — G8929 Other chronic pain: Secondary | ICD-10-CM

## 2020-09-28 DIAGNOSIS — Z8639 Personal history of other endocrine, nutritional and metabolic disease: Secondary | ICD-10-CM

## 2020-09-28 DIAGNOSIS — L4050 Arthropathic psoriasis, unspecified: Secondary | ICD-10-CM

## 2020-09-28 DIAGNOSIS — E781 Pure hyperglyceridemia: Secondary | ICD-10-CM

## 2020-09-28 DIAGNOSIS — Z8261 Family history of arthritis: Secondary | ICD-10-CM

## 2020-09-28 DIAGNOSIS — Z87891 Personal history of nicotine dependence: Secondary | ICD-10-CM

## 2020-09-28 DIAGNOSIS — M25462 Effusion, left knee: Secondary | ICD-10-CM

## 2020-09-28 DIAGNOSIS — R202 Paresthesia of skin: Secondary | ICD-10-CM

## 2020-09-28 DIAGNOSIS — G629 Polyneuropathy, unspecified: Secondary | ICD-10-CM

## 2020-09-28 NOTE — Progress Notes (Signed)
Office Visit Note  Patient: Ann Newton             Date of Birth: 01/02/1956           MRN: [id number]             PCP: Mliss SaxKremer, William Alfred, MD Referring: Mliss SaxKremer, William Alfred,* Visit Date: 10/01/2020 Occupation: @GUAROCC @  Subjective:  Medication monitoring  History of Present Illness: Ann Glasgowerri C Traynham is a 65 y.o. female with history of psoriatic arthritis and osteoarthritis.  She was started on tremfya on 08/03/20.  She has had 2 injections of Tremfya and has not had any side effects or injection site reactions.  She previously had an inadequate response to [place].  She states that the pustular psoriasis on her right palm has resolved.  She has not noticed much improvement in the psoriasis on her feet.  She would like refills of clobetasol and calcipotriene to be sent to the pharmacy today.  She states that she continues to have ongoing pain in both shoulders, both hips, both knee joints.  She notices intermittent swelling in her knee joints.  She denies any increased pain or stiffness in her hands or feet at this time.  She denies any Achilles tendinitis or plantar fasciitis.  She takes meloxicam 7.5 mg as needed for pain relief.  She occasionally uses Voltaren gel topically for pain relief. She denies any recent infections.  She is planning on receiving the annual influenza vaccination.   Activities of Daily Living:  Patient reports morning stiffness for 1 hour Patient Denies nocturnal pain.  Difficulty dressing/grooming: Denies Difficulty climbing stairs: Denies Difficulty getting out of chair: Reports Difficulty using hands for taps, buttons, cutlery, and/or writing: Denies  Review of Systems  Constitutional:  Negative for fatigue.  HENT:  Negative for mouth sores, mouth dryness and nose dryness.   Eyes:  Negative for pain, visual disturbance and dryness.  Respiratory:  Negative for cough, hemoptysis, shortness of breath and difficulty breathing.   Cardiovascular:   Negative for chest pain, palpitations, hypertension and swelling in legs/feet.  Gastrointestinal:  Positive for constipation. Negative for blood in stool and diarrhea.  Endocrine: Negative for increased urination.  Genitourinary:  Negative for painful urination.  Musculoskeletal:  Positive for joint pain, joint pain and morning stiffness. Negative for joint swelling, myalgias, muscle weakness, muscle tenderness and myalgias.  Skin:  Positive for rash (Psoriasis). Negative for color change, pallor, hair loss, nodules/bumps, skin tightness, ulcers and sensitivity to sunlight.  Allergic/Immunologic: Negative for susceptible to infections.  Neurological:  Negative for dizziness, numbness, headaches and weakness.  Hematological:  Negative for swollen glands.  Psychiatric/Behavioral:  Negative for depressed mood and sleep disturbance. The patient is not nervous/anxious.    PMFS History:  Patient Active Problem List   Diagnosis Date Noted   Thyroid nodule greater than or equal to 1 cm in diameter incidentally noted on imaging study 11/20/2018   Fatigue 11/09/2018   Elevated glucose 11/09/2018   Snores 11/09/2018   Hair loss 11/09/2018   Need for influenza vaccination 11/09/2018   Thyromegaly 11/09/2018   Allergic reaction 05/17/2018   Maculopapular rash 05/17/2018   Family history of rheumatoid arthritis 03/09/2018   History of vitamin D deficiency 03/09/2018   Primary osteoarthritis of both knees 02/22/2018   Primary osteoarthritis of both hands 02/22/2018   Neuropathy 11/22/2017   Chronic pain of both knees 11/22/2017   Need for shingles vaccine 11/22/2017   Vitamin D deficiency 01/24/2016   Smoking  hx- ( less 1 ppd * 15 yrs) - Quit Jan 17 2013 12/28/2015   Elevated LDL cholesterol level 12/28/2015   Elevated vitamin B12 level 12/28/2015   h/o Mild peripheral edema- takes HCTZ for this 12/20/2015   Dietary B12 deficiency 12/20/2015   History of chickenpox ( Aug '17 )  12/20/2015    Healthcare maintenance 12/20/2015   Overweight 12/03/2015   h/o Hypertriglyceridemia 12/03/2015   h/o Hyperlipidemia 12/03/2015   Pustular psoriasis of palms and soles 10/28/2010    Past Medical History:  Diagnosis Date   Arthritis    Hyperlipidemia    diet controlled   Psoriasis    Thyroid disease    Wears contact lenses     Family History  Problem Relation Age of Onset   Kidney disease Mother    Diabetes Mother    Heart attack Father    Rheum arthritis Brother    Healthy Daughter    Healthy Son    Heart attack Maternal Grandmother    Heart attack Maternal Grandfather    Cancer Paternal Grandmother    Heart attack Paternal Grandfather    Rheum arthritis Brother    Colon cancer Neg Hx    Colon polyps Neg Hx    Esophageal cancer Neg Hx    Rectal cancer Neg Hx    Stomach cancer Neg Hx    Past Surgical History:  Procedure Laterality Date   ABDOMINAL HYSTERECTOMY  2000   part   ANTERIOR AND POSTERIOR REPAIR N/A 09/08/2016   Procedure: ANTERIOR (CYSTOCELE) AND POSTERIOR REPAIR (RECTOCELE)/Perineorrhaphy;  Surgeon: Olivia Mackie, MD;  Location: [facility];  Service: Gynecology;  Laterality: N/A;  Requests . Repair of enterocele/posterior   COLONOSCOPY     ~12 yrs ago- normal per pt    COSMETIC SURGERY  1999   breast implants   HAGLAND'S DEFORMITY EXCISION Left 09/13/2013   Procedure: LEFT FOOT: EXCISION INTERDIGITAL MORTON'S NEUROMA SINGLE EACH;  Surgeon: Thera Flake., MD;  Location: [facility];  Service: Orthopedics;  Laterality: Left;   KNEE ARTHROSCOPY  2013,2011   right/left   SHOULDER ARTHROSCOPY     rt   TUBAL LIGATION     WISDOM TOOTH EXTRACTION     Social History   Social History Narrative   Not on file   Immunization History  Administered Date(s) Administered   Influenza,inj,Quad PF,6+ Mos 12/03/2015, 11/22/2017, 11/09/2018, 12/11/2019   Moderna Sars-Covid-2 Vaccination 08/19/2019, 09/16/2019   Pneumococcal Polysaccharide-23  11/22/2017   Tdap 02/09/2009, 05/29/2019   Zoster Recombinat (Shingrix) 11/09/2018, 05/29/2019     Objective: Vital Signs: BP 111/74 (BP Location: Left Arm, Patient Position: Sitting, Cuff Size: Small)   Pulse (!) 116   Resp 12   Ht 5\' 7"  (1.702 m)   Wt 196 lb 6.4 oz (89.1 kg)   BMI 30.76 kg/m    Physical Exam Vitals and nursing note reviewed.  Constitutional:      Appearance: She is well-developed.  HENT:     Head: Normocephalic and atraumatic.  Eyes:     Conjunctiva/sclera: Conjunctivae normal.  Pulmonary:     Effort: Pulmonary effort is normal.  Abdominal:     General: Bowel sounds are normal.     Palpations: Abdomen is soft.  Musculoskeletal:     Cervical back: Normal range of motion.  Skin:    General: Skin is warm and dry.     Capillary Refill: Capillary refill takes less than 2 seconds.  Neurological:     Mental Status:  She is alert and oriented to person, place, and time.  Psychiatric:        Behavior: Behavior normal.     Musculoskeletal Exam: C-spine has good range of motion with no discomfort.  Shoulder joints have good range of motion with some discomfort and stiffness bilaterally.  Elbow joints, wrist joints, MCPs, PIPs, DIPs have good range of motion with no synovitis.  PIP and DIP thickening consistent with osteoarthritis of both hands.  Complete fist formation bilaterally.  Hip joints have painful limited range of motion bilaterally.  Tenderness over bilateral trochanteric bursa.  Knee joints have good range of motion with some warmth in the right knee.  Small effusion in the left knee noted.  Ankle joints have good range of motion with no tenderness or joint swelling.  No evidence of Achilles tendinitis.  CDAI Exam: CDAI Score: -- Patient Global: --; Provider Global: -- Swollen: --; Tender: -- Joint Exam 10/01/2020   No joint exam has been documented for this visit   There is currently no information documented on the homunculus. Go to the Rheumatology  activity and complete the homunculus joint exam.  Investigation: No additional findings.  Imaging: No results found.  Recent Labs: Lab Results  Component Value Date   WBC 9.4 09/17/2020   HGB 13.8 09/17/2020   PLT 405 (H) 09/17/2020   NA 140 09/17/2020   K 4.6 09/17/2020   CL 104 09/17/2020   CO2 30 09/17/2020   GLUCOSE 91 09/17/2020   BUN 16 09/17/2020   CREATININE 0.84 09/17/2020   BILITOT 0.4 09/17/2020   ALKPHOS 119 (H) 12/11/2019   AST 19 09/17/2020   ALT 18 09/17/2020   PROT 7.1 09/17/2020   ALBUMIN 4.2 12/11/2019   CALCIUM 9.9 09/17/2020   GFRAA 92 07/09/2020   QFTBGOLDPLUS NEGATIVE 07/09/2020    Speciality Comments: Otezla stopped 08/01/20 Tremfya started 08/03/20  Procedures:  No procedures performed Allergies: Patient has no known allergies.   Assessment / Plan:     Visit Diagnoses: Psoriatic arthropathy (HCC): She has no synovitis or dactylitis on examination today.  No evidence of Achilles tendinitis or plantar fasciitis.  She is not experiencing any SI joint discomfort or tenderness at this time.  She has ongoing pain in both shoulders, both hip joints, and both knee joints.  She has started to increase her exercise regimen which she feels is started to help with some of her joint pain and stiffness.  She was started on Tremfya on 08/03/2020.  She has had 2 injections and has noticed minimal improvement in her joint pain and stiffness.  The pustular psoriasis on her palms has cleared but she still having some pustules on the plantar aspect of both feet.  A refill of clobetasol and calcipotriene were sent to the pharmacy today.  We discussed the importance of giving Tremfya more time to evaluate for the full efficacy.  She is in agreement.  She was advised to notify us if she develops any new or worsening symptoms.  She will follow-up in the office in 3 months.  Pustular psoriasis of palms and soles: The pustular psoriasis on her palms has resolved since initiating  Tremfya on 08/03/2020.  She still has some pustules on the plantar aspect of both feet.  A refill of calcipotriene and clobetasol will be sent to the pharmacy today.  She will remain on Tremfya as prescribed.  High risk medication use - Tremfya-initiated on 08/03/20.  She will continue on Tremfya 100 mg subcutaneous injections  every 8 weeks.  Previously had an adequate response to [place].  CBC and CMP updated on 09/17/20.  She is due to update lab work in December and every 3 months.  Standing orders for CBC and CMP are in place.  TB gold negative on 07/09/20.   She has not had any recent infections.  Discussed the importance of holding Tremfya if she develops signs or symptoms of an infection and to resume once infection has completely cleared. She plans on getting the annual influenza vaccination.    Primary osteoarthritis of both hands: PIP and DIP thickening consistent with osteoarthritis of both hands.  Complete fist formation bilaterally.  No tenderness or inflammation was noted.  Effusion, left knee: Small effusion in the left knee noted.  She has had a cortisone injection and aspiration in the past.  Primary osteoarthritis of both hips: She continues to have chronic pain in both hips.  On examination today she has limited range of motion with discomfort bilaterally.  She is also having tenderness over bilateral trochanteric bursa.  She had MRIs of both hips on 08/12/2020: Moderate osteoarthritis of both hips noted.  She was found to have a mild tendinosis with tiny insertional tear of the right gluteus medius tendon.  She plans on following up with Dr. Madelon Lips.   Primary osteoarthritis of both knees: She continues to have chronic pain and stiffness in both knee joints.  Warmth of the right knee and a small effusion in the left knee was noted.  Paresthesia of both feet   Other medical conditions are listed as follows:  Family history of rheumatoid arthritis  History of vitamin D  deficiency  Neuropathy  h/o Hypertriglyceridemia  Smoking hx- ( less 1 ppd * 15 yrs) - Quit Jan 17 2013  Orders: No orders of the defined types were placed in this encounter.  No orders of the defined types were placed in this encounter.     Follow-Up Instructions: Return in about 3 months (around 12/31/2020) for Psoriatic arthritis.   Gearldine Bienenstock, PA-C  Note - This record has been created using Dragon software.  Chart creation errors have been sought, but may not always  have been located. Such creation errors do not reflect on  the standard of medical care.

## 2020-10-01 ENCOUNTER — Encounter: Payer: Self-pay | Admitting: Physician Assistant

## 2020-10-01 ENCOUNTER — Other Ambulatory Visit: Payer: Self-pay

## 2020-10-01 ENCOUNTER — Ambulatory Visit (INDEPENDENT_AMBULATORY_CARE_PROVIDER_SITE_OTHER): Payer: 59 | Admitting: Physician Assistant

## 2020-10-01 VITALS — BP 111/74 | HR 116 | Resp 12 | Ht 67.0 in | Wt 196.4 lb

## 2020-10-01 DIAGNOSIS — G629 Polyneuropathy, unspecified: Secondary | ICD-10-CM

## 2020-10-01 DIAGNOSIS — Z8639 Personal history of other endocrine, nutritional and metabolic disease: Secondary | ICD-10-CM

## 2020-10-01 DIAGNOSIS — Z8261 Family history of arthritis: Secondary | ICD-10-CM

## 2020-10-01 DIAGNOSIS — G8929 Other chronic pain: Secondary | ICD-10-CM

## 2020-10-01 DIAGNOSIS — E781 Pure hyperglyceridemia: Secondary | ICD-10-CM

## 2020-10-01 DIAGNOSIS — L4050 Arthropathic psoriasis, unspecified: Secondary | ICD-10-CM

## 2020-10-01 DIAGNOSIS — M25462 Effusion, left knee: Secondary | ICD-10-CM

## 2020-10-01 DIAGNOSIS — M19041 Primary osteoarthritis, right hand: Secondary | ICD-10-CM

## 2020-10-01 DIAGNOSIS — Z79899 Other long term (current) drug therapy: Secondary | ICD-10-CM | POA: Diagnosis not present

## 2020-10-01 DIAGNOSIS — M19042 Primary osteoarthritis, left hand: Secondary | ICD-10-CM

## 2020-10-01 DIAGNOSIS — M170 Bilateral primary osteoarthritis of knee: Secondary | ICD-10-CM

## 2020-10-01 DIAGNOSIS — Z87891 Personal history of nicotine dependence: Secondary | ICD-10-CM

## 2020-10-01 DIAGNOSIS — L403 Pustulosis palmaris et plantaris: Secondary | ICD-10-CM | POA: Diagnosis not present

## 2020-10-01 DIAGNOSIS — M160 Bilateral primary osteoarthritis of hip: Secondary | ICD-10-CM

## 2020-10-01 DIAGNOSIS — R202 Paresthesia of skin: Secondary | ICD-10-CM

## 2020-10-01 LAB — HM MAMMOGRAPHY

## 2020-10-01 MED ORDER — CLOBETASOL PROPIONATE 0.05 % EX OINT
TOPICAL_OINTMENT | Freq: Two times a day (BID) | CUTANEOUS | 2 refills | Status: DC
Start: 2020-10-01 — End: 2020-12-01

## 2020-10-01 MED ORDER — CALCIPOTRIENE 0.005 % EX CREA
TOPICAL_CREAM | Freq: Every day | CUTANEOUS | 2 refills | Status: DC
Start: 2020-10-01 — End: 2021-02-04

## 2020-10-01 NOTE — Progress Notes (Unsigned)
Please review and send pended refills. Thanks!

## 2020-10-21 ENCOUNTER — Encounter: Payer: Self-pay | Admitting: Family Medicine

## 2020-10-22 ENCOUNTER — Ambulatory Visit: Payer: 59 | Admitting: Internal Medicine

## 2020-11-02 ENCOUNTER — Ambulatory Visit: Payer: 59 | Admitting: Internal Medicine

## 2020-11-04 ENCOUNTER — Other Ambulatory Visit: Payer: Self-pay | Admitting: Rheumatology

## 2020-11-04 DIAGNOSIS — L4050 Arthropathic psoriasis, unspecified: Secondary | ICD-10-CM

## 2020-11-04 DIAGNOSIS — L403 Pustulosis palmaris et plantaris: Secondary | ICD-10-CM

## 2020-11-04 NOTE — Telephone Encounter (Signed)
Next Visit: 12/24/2020  Last Visit: 10/01/2020  Last Fill: 08/03/2020  QQ:[id number] arthropathy   Current Dose per office note 10/01/2020: Tremfya 100 mg subcutaneous injections every 8 weeks  Labs: 09/17/2020 Platelet count is borderline elevated-405.  Rest of CBC WNL.  CMP WNL.    TB Gold: 07/09/2020 Neg    Okay to refill Tremfya?

## 2020-11-18 ENCOUNTER — Other Ambulatory Visit: Payer: Self-pay

## 2020-11-18 ENCOUNTER — Ambulatory Visit (INDEPENDENT_AMBULATORY_CARE_PROVIDER_SITE_OTHER): Payer: 59 | Admitting: Neurology

## 2020-11-18 ENCOUNTER — Other Ambulatory Visit (INDEPENDENT_AMBULATORY_CARE_PROVIDER_SITE_OTHER): Payer: 59

## 2020-11-18 ENCOUNTER — Encounter: Payer: Self-pay | Admitting: Neurology

## 2020-11-18 VITALS — BP 127/67 | HR 82 | Ht 68.0 in | Wt 193.4 lb

## 2020-11-18 DIAGNOSIS — R202 Paresthesia of skin: Secondary | ICD-10-CM

## 2020-11-18 LAB — B12 AND FOLATE PANEL
Folate: 23.1 ng/mL (ref >5.9)
Vitamin B-12: 559 pg/mL (ref 211–911)

## 2020-11-18 NOTE — Patient Instructions (Addendum)
Nerve testing of the legs.  Do not apply lotion or oil to your skin on the day of testing  Check labs Your provider has requested that you have labwork completed today. The lab is located on the Second floor at [address], within the [facility] office. When you get off the elevator, turn right and go in the [facility] [address]; the first brown door on the left.  Tell the ladies behind the desk that you are there for lab work. If you are not called within 15 minutes please check with the front desk.   Once you complete your labs you are free to go. You will receive a call or message via MyChart with your lab results.

## 2020-11-18 NOTE — Progress Notes (Signed)
[facility] Clinic Note - Initial Visit   Date: 11/18/20  Ann Newton MRN: [id number] DOB: 1955/12/08   Dear Dr. Corliss Skains:  Thank you for your kind referral of Ann Newton for consultation of feet paresthesias. Although her history is well known to you, please allow [name] to reiterate it for the purpose of our medical record. The patient was accompanied to the clinic by self.    History of Present Illness: Ann Newton is a 65 y.o. right-handed female with psoriatic arthritis, osteoarthritis, and diet controlled hyperlipidemia presenting for evaluation of bilateral feet numbness/tingling.   Starting around the spring of 2022, she began having numbness/tingling involving the toes and soles of the feet. She is more aware of symptoms when at rest such as when sitting or standing. She initially attributed it to having psoriasis on her feet.  She has history of Morton's neuroma and had surgery for this which has resulted in numbness involving the last three toes on the left.  Balance is good.  No weakness or radicular pain.  No numbness/tingling involving the hands.  No history of diabetes mellitus or alcohol.  Mother had diabetic neuropathy.   She works remotely for [employer].  She lives at home with her husband.   Out-side paper records, electronic medical record, and images have been reviewed where available and summarized as:  Lab Results  Component Value Date   HGBA1C 5.8 11/09/2018   Lab Results  Component Value Date   VITAMINB12 835 11/09/2018   Lab Results  Component Value Date   TSH 0.90 12/11/2019   Lab Results  Component Value Date   ESRSEDRATE 34 (H) 07/09/2020    Past Medical History:  Diagnosis Date   Arthritis    Hyperlipidemia    diet controlled   Psoriasis    Thyroid disease    Wears contact lenses     Past Surgical History:  Procedure Laterality Date   ABDOMINAL HYSTERECTOMY  2000   part   ANTERIOR AND  POSTERIOR REPAIR N/A 09/08/2016   Procedure: ANTERIOR (CYSTOCELE) AND POSTERIOR REPAIR (RECTOCELE)/Perineorrhaphy;  Surgeon: Olivia Mackie, MD;  Location: [facility];  Service: Gynecology;  Laterality: N/A;  Requests . Repair of enterocele/posterior   COLONOSCOPY     ~12 yrs ago- normal per pt    COSMETIC SURGERY  1999   breast implants   HAGLAND'S DEFORMITY EXCISION Left 09/13/2013   Procedure: LEFT FOOT: EXCISION INTERDIGITAL MORTON'S NEUROMA SINGLE EACH;  Surgeon: Thera Flake., MD;  Location: [facility];  Service: Orthopedics;  Laterality: Left;   KNEE ARTHROSCOPY  2013,2011   right/left   SHOULDER ARTHROSCOPY     rt   TUBAL LIGATION     WISDOM TOOTH EXTRACTION       Medications:  Outpatient Encounter Medications as of 11/18/2020  Medication Sig   Guselkumab (TREMFYA) 100 MG/ML SOPN INJECT 100MG  SUBCUTANEOUSLY EVERY 8 WEEKS   acyclovir (ZOVIRAX) 400 MG tablet Take 400 mg by mouth as needed.   calcipotriene (DOVONOX) 0.005 % cream Apply topically daily.   clobetasol ointment (TEMOVATE) 0.05 % Apply topically 2 (two) times daily.   diclofenac sodium (VOLTAREN) 1 % GEL 3 grams to 3 large joints up to 3 times daily   meloxicam (MOBIC) 7.5 MG tablet Take 7.5 mg by mouth as needed.   Multiple Vitamins-Minerals (CENTRUM SILVER 50+WOMEN) TABS Take 1 tablet by mouth daily.   No facility-administered encounter medications on file as of 11/18/2020.  Allergies: No Known Allergies  Family History: Family History  Problem Relation Age of Onset   Kidney disease Mother    Diabetes Mother    Heart attack Father    Rheum arthritis Brother    Healthy Daughter    Healthy Son    Heart attack Maternal Grandmother    Heart attack Maternal Grandfather    Cancer Paternal Grandmother    Heart attack Paternal Grandfather    Rheum arthritis Brother    Colon cancer Neg Hx    Colon polyps Neg Hx    Esophageal cancer Neg Hx    Rectal cancer Neg Hx    Stomach cancer Neg Hx      Social History: Social History   Tobacco Use   Smoking status: Former    Packs/day: 0.50    Years: 10.00    Pack years: 5.00    Types: Cigarettes    Quit date: 09/12/2012    Years since quitting: 8.1   Smokeless tobacco: Never  Vaping Use   Vaping Use: Never used  Substance Use Topics   Alcohol use: No   Drug use: No   Social History   Social History Narrative   Right handed     Vital Signs:  BP 127/67   Pulse 82   Ht 5\' 8"  (1.727 m)   Wt 193 lb 6.4 oz (87.7 kg)   SpO2 95%   BMI 29.41 kg/m   Neurological Exam: MENTAL STATUS including orientation to time, place, person, recent and remote memory, attention span and concentration, language, and fund of knowledge is normal.  Speech is not dysarthric.  CRANIAL NERVES: II:  No visual field defects.   III-IV-VI: Pupils equal round and reactive to light.  Normal conjugate, extra-ocular eye movements in all directions of gaze.  No nystagmus.  No ptosis.   V:  Normal facial sensation.    VII:  Normal facial symmetry and movements.   VIII:  Normal hearing and vestibular function.   IX-X:  Normal palatal movement.   XI:  Normal shoulder shrug and head rotation.   XII:  Normal tongue strength and range of motion, no deviation or fasciculation.  MOTOR:  No atrophy, fasciculations or abnormal movements.  No pronator drift.   Upper Extremity:  Right  Left  Deltoid  5/5   5/5   Biceps  5/5   5/5   Triceps  5/5   5/5   Infraspinatus 5/5  5/5  Medial pectoralis 5/5  5/5  Wrist extensors  5/5   5/5   Wrist flexors  5/5   5/5   Finger extensors  5/5   5/5   Finger flexors  5/5   5/5   Dorsal interossei  5/5   5/5   Abductor pollicis  5/5   5/5   Tone (Ashworth scale)  0  0   Lower Extremity:  Right  Left  Hip flexors  5/5   5/5   Hip extensors  5/5   5/5   Adductor 5/5  5/5  Abductor 5/5  5/5  Knee flexors  5/5   5/5   Knee extensors  5/5   5/5   Dorsiflexors  5/5   5/5   Plantarflexors  5/5   5/5   Toe  extensors  5/5   5/5   Toe flexors  5/5   5/5   Tone (Ashworth scale)  0  0   MSRs:  Right        Left  brachioradialis 2+  2+  biceps 2+  2+  triceps 2+  2+  patellar 2+  2+  ankle jerk 2+  2+  Hoffman no  no  plantar response down  down   SENSORY:  Vibration reduced to 80% at the ankles bilaterally, temperature is mildly reduced over the feet.  Pin prick is intact.  Romberg's sign absent.   COORDINATION/GAIT: Normal finger-to- nose-finger.  Intact rapid alternating movements bilaterally.  Gait narrow based and stable. Tandem and stressed gait intact.    IMPRESSION: Bilateral feet paresthesias in the setting of psoriatic arthritis concerning for neuropathy  - NCS/EMG of the legs to characterize the nature of her symptoms  - Check vitamin B12, folate, copper.  TSH and SPEP has been normal.   - She many questions about management of neuropathy and prognosis which was addressed  Further recommendations pending results.    Thank you for allowing me to participate in patient's care.  If I can answer any additional questions, I would be pleased to do so.    Sincerely,    Timera Windt K. Allena Katz, DO

## 2020-11-20 LAB — COPPER, SERUM: Copper: 142 ug/dL (ref 70–175)

## 2020-12-01 ENCOUNTER — Other Ambulatory Visit: Payer: Self-pay

## 2020-12-01 ENCOUNTER — Other Ambulatory Visit: Payer: Self-pay | Admitting: Physician Assistant

## 2020-12-01 MED ORDER — CLOBETASOL PROPIONATE 0.05 % EX OINT
TOPICAL_OINTMENT | Freq: Two times a day (BID) | CUTANEOUS | 2 refills | Status: DC
Start: 2020-12-01 — End: 2021-04-01

## 2020-12-01 NOTE — Telephone Encounter (Signed)
Next Visit: 12/24/2020  Last Visit: 10/01/2020  Last Fill: 10/01/2020  Dx: Pustular psoriasis of palms and soles  Current Dose per office note on 10/01/2020:  A refill of calcipotriene and clobetasol will be sent to the pharmacy today.  Dose not dicussed.   Okay to refill Calcipotriene?

## 2020-12-01 NOTE — Telephone Encounter (Signed)
Next Visit: 12/24/2020   Last Visit: 10/01/2020   Last Fill: 10/01/2020   Dx: Pustular psoriasis of palms and soles   Current Dose per office note on 10/01/2020:  A refill of calcipotriene and clobetasol will be sent to the pharmacy today.  Dose not dicussed.    Okay to refill Clobetasol?

## 2020-12-01 NOTE — Telephone Encounter (Signed)
Patient called stating [facility] told her the prescription of Clobetasol was sent in as a cream and she needs the ointment.  Patient states [facility] will need a new prescription.

## 2020-12-14 ENCOUNTER — Ambulatory Visit: Payer: 59 | Admitting: Nurse Practitioner

## 2020-12-14 NOTE — Progress Notes (Signed)
Office Visit Note  Patient: Ann Newton             Date of Birth: 10/23/1955           MRN: [id number]             PCP: Libby Maw, MD Referring: Libby Maw,* Visit Date: 12/24/2020 Occupation: @GUAROCC @  Subjective:  Medication monitoring   History of Present Illness: Ann Newton is a 65 y.o. female with history of psoriatic arthritis and osteoarthritis.  She is currently on tremfya 100 mg every 8 weeks.  Tremfya was initiated on 08/03/20.  She is tolerating Tremfya without any side effects or injection site reactions.  Her most recent dose was on 12/22/2020 which was her fourth injection.  She has noticed significant clinical improvement on current therapy.  The psoriasis on her palms and plantar aspect of both feet resolved after the third injection.  She states that right before her fourth injection she started to notice an increase scaling which has since improved.  She denies any increased joint pain or joint swelling.  Her stiffness in her shoulders and knee joints has improved.  She denies any SI joint discomfort.  She denies any Achilles tendonitis or plantar fasciitis. She denies any recent infections.   Activities of Daily Living:  Patient reports morning stiffness for 0 minutes  Patient Denies nocturnal pain.  Difficulty dressing/grooming: Denies Difficulty climbing stairs: Reports Difficulty getting out of chair: Reports Difficulty using hands for taps, buttons, cutlery, and/or writing: Denies  Review of Systems  Constitutional:  Positive for fatigue.  HENT:  Positive for mouth dryness. Negative for mouth sores and nose dryness.   Eyes:  Positive for dryness. Negative for pain and visual disturbance.  Respiratory:  Negative for cough, hemoptysis, shortness of breath and difficulty breathing.   Cardiovascular:  Negative for chest pain, palpitations, hypertension and swelling in legs/feet.  Gastrointestinal:  Positive for constipation.  Negative for blood in stool and diarrhea.  Endocrine: Negative for increased urination.  Genitourinary:  Negative for painful urination.  Musculoskeletal:  Positive for morning stiffness. Negative for joint pain, joint pain, joint swelling, myalgias, muscle weakness, muscle tenderness and myalgias.  Skin:  Negative for color change, pallor, rash, hair loss, nodules/bumps, skin tightness, ulcers and sensitivity to sunlight.  Allergic/Immunologic: Negative for susceptible to infections.  Neurological:  Negative for dizziness, numbness, headaches and weakness.  Hematological:  Negative for swollen glands.  Psychiatric/Behavioral:  Negative for depressed mood and sleep disturbance. The patient is not nervous/anxious.    PMFS History:  Patient Active Problem List   Diagnosis Date Noted   Thyroid nodule greater than or equal to 1 cm in diameter incidentally noted on imaging study 11/20/2018   Fatigue 11/09/2018   Elevated glucose 11/09/2018   Snores 11/09/2018   Hair loss 11/09/2018   Need for influenza vaccination 11/09/2018   Thyromegaly 11/09/2018   Allergic reaction 05/17/2018   Maculopapular rash 05/17/2018   Family history of rheumatoid arthritis 03/09/2018   History of vitamin D deficiency 03/09/2018   Primary osteoarthritis of both knees 02/22/2018   Primary osteoarthritis of both hands 02/22/2018   Neuropathy 11/22/2017   Chronic pain of both knees 11/22/2017   Need for shingles vaccine 11/22/2017   Vitamin D deficiency 01/24/2016   Smoking hx- ( less 1 ppd * 15 yrs) - Quit Jan 17 2013 12/28/2015   Elevated LDL cholesterol level 12/28/2015   Elevated vitamin B12 level 12/28/2015   h/o Mild  peripheral edema- takes HCTZ for this 12/20/2015   Dietary B12 deficiency 12/20/2015   History of chickenpox ( Aug '17 )  12/20/2015   Healthcare maintenance 12/20/2015   Overweight 12/03/2015   h/o Hypertriglyceridemia 12/03/2015   h/o Hyperlipidemia 12/03/2015   Pustular psoriasis of  palms and soles 10/28/2010    Past Medical History:  Diagnosis Date   Arthritis    Hyperlipidemia    diet controlled   Psoriasis    Thyroid disease    Wears contact lenses     Family History  Problem Relation Age of Onset   Kidney disease Mother    Diabetes Mother    Heart attack Father    Rheum arthritis Brother    Healthy Daughter    Healthy Son    Heart attack Maternal Grandmother    Heart attack Maternal Grandfather    Cancer Paternal Grandmother    Heart attack Paternal Grandfather    Rheum arthritis Brother    Colon cancer Neg Hx    Colon polyps Neg Hx    Esophageal cancer Neg Hx    Rectal cancer Neg Hx    Stomach cancer Neg Hx    Past Surgical History:  Procedure Laterality Date   ABDOMINAL HYSTERECTOMY  2000   part   ANTERIOR AND POSTERIOR REPAIR N/A 09/08/2016   Procedure: ANTERIOR (CYSTOCELE) AND POSTERIOR REPAIR (RECTOCELE)/Perineorrhaphy;  Surgeon: Brien Few, MD;  Location: [facility];  Service: Gynecology;  Laterality: N/A;  Requests 70min. Repair of enterocele/posterior   COLONOSCOPY     ~12 yrs ago- normal per pt    COSMETIC SURGERY  1999   breast implants   HAGLAND'S DEFORMITY EXCISION Left 09/13/2013   Procedure: LEFT FOOT: EXCISION INTERDIGITAL MORTON'S NEUROMA SINGLE EACH;  Surgeon: Yvette Rack., MD;  Location: [facility];  Service: Orthopedics;  Laterality: Left;   KNEE ARTHROSCOPY  2013,2011   right/left   SHOULDER ARTHROSCOPY     rt   TUBAL LIGATION     WISDOM TOOTH EXTRACTION     Social History   Social History Narrative   Right handed    Immunization History  Administered Date(s) Administered   Influenza,inj,Quad PF,6+ Mos 12/03/2015, 11/22/2017, 11/09/2018, 12/11/2019   Moderna Sars-Covid-2 Vaccination 08/19/2019, 09/16/2019   Pneumococcal Polysaccharide-23 11/22/2017   Tdap 02/09/2009, 05/29/2019   Zoster Recombinat (Shingrix) 11/09/2018, 05/29/2019     Objective: Vital Signs: BP 108/64 (BP Location: Left Arm,  Patient Position: Sitting, Cuff Size: Small)   Pulse 94   Resp 12   Ht 5\' 8"  (1.727 m)   Wt 193 lb 6.4 oz (87.7 kg)   BMI 29.41 kg/m    Physical Exam Vitals and nursing note reviewed.  Constitutional:      Appearance: She is well-developed.  HENT:     Head: Normocephalic and atraumatic.  Eyes:     Conjunctiva/sclera: Conjunctivae normal.  Pulmonary:     Effort: Pulmonary effort is normal.  Abdominal:     Palpations: Abdomen is soft.  Musculoskeletal:     Cervical back: Normal range of motion.  Skin:    General: Skin is warm and dry.     Capillary Refill: Capillary refill takes less than 2 seconds.  Neurological:     Mental Status: She is alert and oriented to person, place, and time.  Psychiatric:        Behavior: Behavior normal.     Musculoskeletal Exam: C-spine, thoracic spine, and lumbar spine good ROM.  Shoulder joints, elbow joints, wrist  joints, MCPs, PIPs, and DIPs good ROM with no synovitis.  Complete fist formation bilaterally.  PIP and DIP thickening consistent with OA of both hands.  Hip joints have slightly limited range of motion with no groin pain.  Knee joints have good ROM with no warmth or effusion.  Ankle joint have good ROM with no tenderness or joint swelling.  No evidence of achilles tendonitis.    CDAI Exam: CDAI Score: -- Patient Global: --; Provider Global: -- Swollen: --; Tender: -- Joint Exam 12/24/2020   No joint exam has been documented for this visit   There is currently no information documented on the homunculus. Go to the Rheumatology activity and complete the homunculus joint exam.  Investigation: No additional findings.  Imaging: No results found.  Recent Labs: Lab Results  Component Value Date   WBC 9.4 09/17/2020   HGB 13.8 09/17/2020   PLT 405 (H) 09/17/2020   NA 140 09/17/2020   K 4.6 09/17/2020   CL 104 09/17/2020   CO2 30 09/17/2020   GLUCOSE 91 09/17/2020   BUN 16 09/17/2020   CREATININE 0.84 09/17/2020   BILITOT  0.4 09/17/2020   ALKPHOS 119 (H) 12/11/2019   AST 19 09/17/2020   ALT 18 09/17/2020   PROT 7.1 09/17/2020   ALBUMIN 4.2 12/11/2019   CALCIUM 9.9 09/17/2020   GFRAA 92 07/09/2020   QFTBGOLDPLUS NEGATIVE 07/09/2020    Speciality Comments: Otezla stopped 08/01/20 Tremfya started 08/03/20  Procedures:  No procedures performed Allergies: Patient has no known allergies.   Assessment / Plan:     Visit Diagnoses: Psoriatic arthropathy (HCC): She has no synovitis or dactylitis on examination.  She has not had any signs or symptoms of a psoriatic arthritis flare on current therapy.  She is on Tremfya 100 mg subcutaneous injections every 8 weeks.  Her most recent injection was on 12/22/2020 which was her fourth dose since initiating on 08/03/2020.  She has been tolerating Tremfya without any side effects or injection site reactions.  She has noticed significant clinical improvement on current therapy.  Her pustular psoriasis has cleared on her palms and plantar aspect of both feet.  She is also noticed less stiffness in her shoulder joints and both knees.  She has no Achilles tendinitis or plantar fasciitis.  No SI joint discomfort or stiffness at this time.  She will remain on the current treatment regimen.  She was advised to notify us if she develops any new or worsening symptoms.  She will follow-up in the office in 3 months.  Pustular psoriasis of palms and soles - The pustular psoriasis on her palms has resolved since initiating Tremfya on 08/03/2020.   High risk medication use - Tremfya-initiated on 08/03/20.  She will continue on Tremfya 100 mg subcutaneous injections every 8 weeks.  Previously had an adequate response to [place].  CBC and CMP updated on 09/17/20.  She is due to update lab work today.  Orders released.  Her next lab work will be due in March and every 3 months.  Standing orders for CBC and CMP are in place.  TB gold negative on 07/09/20.  - Plan: COMPLETE METABOLIC PANEL WITH GFR, CBC with  Differential/Platelet Discussed the importance of holding tremfya if she develops signs or symptoms of an infection and to resume once the infection has completely cleared.   Primary osteoarthritis of both hands: She has PIP and DIP thickening consistent with osteoarthritis of both hands.  No tenderness or inflammation was noted on  examination.  She has not had any difficulty with ADLs.  Discussed the importance of joint protection and muscle strengthening.  Effusion, left knee: Resolved  Primary osteoarthritis of both hips: Slightly limited range of motion of both hip joints on examination.  She is not experiencing any groin pain at this time.  Primary osteoarthritis of both knees: She has good range of motion of both knee joints on examination today.  No warmth or effusion was noted.  Paresthesia of both feet: Resolved   Other medical conditions are listed as follows:   Family history of rheumatoid arthritis  History of vitamin D deficiency  Neuropathy  h/o Hypertriglyceridemia  Smoking hx- ( less 1 ppd * 15 yrs) - Quit Jan 17 2013  Orders: Orders Placed This Encounter  Procedures   COMPLETE METABOLIC PANEL WITH GFR   CBC with Differential/Platelet   No orders of the defined types were placed in this encounter.     Follow-Up Instructions: Return in about 3 months (around 03/24/2021) for Psoriatic arthritis, Osteoarthritis.   Ofilia Neas, PA-C  Note - This record has been created using Dragon software.  Chart creation errors have been sought, but may not always  have been located. Such creation errors do not reflect on  the standard of medical care.

## 2020-12-15 ENCOUNTER — Encounter: Payer: 59 | Admitting: Neurology

## 2020-12-24 ENCOUNTER — Encounter: Payer: Self-pay | Admitting: Physician Assistant

## 2020-12-24 ENCOUNTER — Ambulatory Visit (INDEPENDENT_AMBULATORY_CARE_PROVIDER_SITE_OTHER): Payer: 59 | Admitting: Physician Assistant

## 2020-12-24 ENCOUNTER — Other Ambulatory Visit: Payer: Self-pay

## 2020-12-24 VITALS — BP 108/64 | HR 94 | Resp 12 | Ht 68.0 in | Wt 193.4 lb

## 2020-12-24 DIAGNOSIS — L403 Pustulosis palmaris et plantaris: Secondary | ICD-10-CM

## 2020-12-24 DIAGNOSIS — Z79899 Other long term (current) drug therapy: Secondary | ICD-10-CM | POA: Diagnosis not present

## 2020-12-24 DIAGNOSIS — M19042 Primary osteoarthritis, left hand: Secondary | ICD-10-CM

## 2020-12-24 DIAGNOSIS — E781 Pure hyperglyceridemia: Secondary | ICD-10-CM

## 2020-12-24 DIAGNOSIS — M25462 Effusion, left knee: Secondary | ICD-10-CM

## 2020-12-24 DIAGNOSIS — G629 Polyneuropathy, unspecified: Secondary | ICD-10-CM

## 2020-12-24 DIAGNOSIS — R202 Paresthesia of skin: Secondary | ICD-10-CM

## 2020-12-24 DIAGNOSIS — Z87891 Personal history of nicotine dependence: Secondary | ICD-10-CM

## 2020-12-24 DIAGNOSIS — L4050 Arthropathic psoriasis, unspecified: Secondary | ICD-10-CM | POA: Diagnosis not present

## 2020-12-24 DIAGNOSIS — M19041 Primary osteoarthritis, right hand: Secondary | ICD-10-CM

## 2020-12-24 DIAGNOSIS — Z8261 Family history of arthritis: Secondary | ICD-10-CM

## 2020-12-24 DIAGNOSIS — M160 Bilateral primary osteoarthritis of hip: Secondary | ICD-10-CM

## 2020-12-24 DIAGNOSIS — M170 Bilateral primary osteoarthritis of knee: Secondary | ICD-10-CM

## 2020-12-24 DIAGNOSIS — Z8639 Personal history of other endocrine, nutritional and metabolic disease: Secondary | ICD-10-CM

## 2020-12-25 LAB — CBC WITH DIFFERENTIAL/PLATELET
Absolute Monocytes: 806 cells/uL (ref 200–950)
Basophils Absolute: 51 cells/uL (ref 0–200)
Basophils Relative: 0.5 %
Eosinophils Absolute: 153 cells/uL (ref 15–500)
Eosinophils Relative: 1.5 %
HCT: 41.3 % (ref 35.0–45.0)
Hemoglobin: 13.7 g/dL (ref 11.7–15.5)
Lymphs Abs: 3815 cells/uL (ref 850–3900)
MCH: 30.2 pg (ref 27.0–33.0)
MCHC: 33.2 g/dL (ref 32.0–36.0)
MCV: 91 fL (ref 80.0–100.0)
MPV: 10.3 fL (ref 7.5–12.5)
Monocytes Relative: 7.9 %
Neutro Abs: 5375 cells/uL (ref 1500–7800)
Neutrophils Relative %: 52.7 %
Platelets: 413 10*3/uL — ABNORMAL HIGH (ref 140–400)
RBC: 4.54 10*6/uL (ref 3.80–5.10)
RDW: 13.3 % (ref 11.0–15.0)
Total Lymphocyte: 37.4 %
WBC: 10.2 10*3/uL (ref 3.8–10.8)

## 2020-12-25 LAB — COMPLETE METABOLIC PANEL WITH GFR
AG Ratio: 1.4 (calc) (ref 1.0–2.5)
ALT: 21 U/L (ref 6–29)
AST: 19 U/L (ref 10–35)
Albumin: 4.1 g/dL (ref 3.6–5.1)
Alkaline phosphatase (APISO): 114 U/L (ref 37–153)
BUN: 13 mg/dL (ref 7–25)
CO2: 30 mmol/L (ref 20–32)
Calcium: 9.6 mg/dL (ref 8.6–10.4)
Chloride: 102 mmol/L (ref 98–110)
Creat: 0.8 mg/dL (ref 0.50–1.05)
Globulin: 2.9 g/dL (calc) (ref 1.9–3.7)
Glucose, Bld: 93 mg/dL (ref 65–99)
Potassium: 4.9 mmol/L (ref 3.5–5.3)
Sodium: 140 mmol/L (ref 135–146)
Total Bilirubin: 0.4 mg/dL (ref 0.2–1.2)
Total Protein: 7 g/dL (ref 6.1–8.1)
eGFR: 82 mL/min/{1.73_m2} (ref >=60)

## 2020-12-25 NOTE — Progress Notes (Signed)
Platelet count is borderline elevated-413.  Rest CBC WNL.  CMP WNL.

## 2020-12-30 ENCOUNTER — Telehealth: Payer: Self-pay | Admitting: Neurology

## 2020-12-30 ENCOUNTER — Other Ambulatory Visit: Payer: Self-pay

## 2020-12-30 ENCOUNTER — Ambulatory Visit (INDEPENDENT_AMBULATORY_CARE_PROVIDER_SITE_OTHER): Payer: 59 | Admitting: Neurology

## 2020-12-30 DIAGNOSIS — R202 Paresthesia of skin: Secondary | ICD-10-CM | POA: Diagnosis not present

## 2020-12-30 NOTE — Procedures (Signed)
[facility] Neurology  [address] [place], [address]  [place], [place] [address] Tel: [phone] Fax:  [phone] Test Date:  12/30/2020  Patient: Ann Newton DOB: 1955-03-02 Physician: Nita Sickle, DO  Sex: Female Height: 5\' 8"  Ref Phys: , DO  ID#: Nita Sickle   Technician:    Patient Complaints: This is a 65 year old female referred for evaluation of bilateral feet numbness  NCV & EMG Findings: Electrodiagnostic testing of the right lower extremity and additional studies of the left shows: Bilateral sural and superficial peroneal sensory responses are within normal limits. Bilateral peroneal and tibial motor responses are within normal limits. Bilateral tibial H reflex studies are within normal limits. There is no evidence of active or chronic motor axonal changes affecting any of the tested muscles.  Motor unit configuration and recruitment pattern is within normal limits.  Impression: This is a normal study of the lower extremities.  In particular, there is no evidence of a sensorimotor polyneuropathy or lumbosacral radiculopathy.   ___________________________ 77, DO    Nerve Conduction Studies Anti Sensory Summary Table   Stim Site NR Peak (ms) Norm Peak (ms) P-T Amp (V) Norm P-T Amp  Left Sup Peroneal Anti Sensory (Ant Lat Mall)  32C  12 cm    2.5 <4.6 9.1 >3  Right Sup Peroneal Anti Sensory (Ant Lat Mall)  32C  12 cm    2.6 <4.6 8.3 >3  Left Sural Anti Sensory (Lat Mall)  32C  Calf    3.1 <4.6 10.0 >3  Right Sural Anti Sensory (Lat Mall)  32C  Calf    2.6 <4.6 13.7 >3   Motor Summary Table   Stim Site NR Onset (ms) Norm Onset (ms) O-P Amp (mV) Norm O-P Amp Site1 Site2 Delta-0 (ms) Dist (cm) Vel (m/s) Norm Vel (m/s)  Left Peroneal Motor (Ext Dig Brev)  32C  Ankle    4.0 <6.0 2.5 >2.5 B Fib Ankle 8.6 37.0 43 >40  B Fib    12.6  1.7  Poplt B Fib 1.5 7.0 47 >40  Poplt    14.1  1.4         Right Peroneal Motor (Ext Dig Brev)  32C   Ankle    3.4 <6.0 3.5 >2.5 B Fib Ankle 8.2 37.0 45 >40  B Fib    11.6  3.4  Poplt B Fib 1.8 8.0 44 >40  Poplt    13.4  3.3         Left Tibial Motor (Abd Hall Brev)  32C  Ankle    5.2 <6.0 11.0 >4 Knee Ankle 7.8 42.0 54 >40  Knee    13.0  5.6         Right Tibial Motor (Abd Hall Brev)  32C  Ankle    3.1 <6.0 12.9 >4 Knee Ankle 8.9 44.0 49 >40  Knee    12.0  7.7          H Reflex Studies   NR H-Lat (ms) Lat Norm (ms) L-R H-Lat (ms)  Left Tibial (Gastroc)  32C     34.29 <35 0.00  Right Tibial (Gastroc)  32C     34.29 <35 0.00   EMG   Side Muscle Ins Act Fibs Psw Fasc Number Recrt Dur Dur. Amp Amp. Poly Poly. Comment  Right AntTibialis Nml Nml Nml Nml Nml Nml Nml Nml Nml Nml Nml Nml N/A  Right Gastroc Nml Nml Nml Nml Nml Nml Nml Nml Nml Nml Nml Nml N/A  Right Flex Dig Long Nml Nml Nml Nml Nml Nml Nml Nml Nml Nml Nml Nml N/A  Right RectFemoris Nml Nml Nml Nml Nml Nml Nml Nml Nml Nml Nml Nml N/A  Right BicepsFemS Nml Nml Nml Nml Nml Nml Nml Nml Nml Nml Nml Nml N/A  Left Flex Dig Long Nml Nml Nml Nml Nml Nml Nml Nml Nml Nml Nml Nml N/A  Left Gastroc Nml Nml Nml Nml Nml Nml Nml Nml Nml Nml Nml Nml N/A  Left RectFemoris Nml Nml Nml Nml Nml Nml Nml Nml Nml Nml Nml Nml N/A  Left BicepsFemS Nml Nml Nml Nml Nml Nml Nml Nml Nml Nml Nml Nml N/A  Left AntTibialis Nml Nml Nml Nml Nml Nml Nml Nml Nml Nml Nml Nml N/A      Waveforms:

## 2020-12-30 NOTE — Telephone Encounter (Signed)
Patient informed of normal EMG results, specifically no large fiber neuropathy.  At this point, I suggest continuing to monitor symptoms.  If the numbness progresses, repeat EMG or skin biopsy can be performed.

## 2021-01-05 ENCOUNTER — Ambulatory Visit: Payer: 59 | Admitting: Nurse Practitioner

## 2021-01-07 ENCOUNTER — Encounter: Payer: 59 | Admitting: Neurology

## 2021-01-28 ENCOUNTER — Ambulatory Visit (INDEPENDENT_AMBULATORY_CARE_PROVIDER_SITE_OTHER): Payer: 59 | Admitting: Nurse Practitioner

## 2021-01-28 ENCOUNTER — Encounter: Payer: Self-pay | Admitting: Nurse Practitioner

## 2021-01-28 ENCOUNTER — Other Ambulatory Visit: Payer: Self-pay

## 2021-01-28 VITALS — BP 111/71 | HR 80 | Temp 98.0°F | Ht 68.0 in | Wt 196.4 lb

## 2021-01-28 DIAGNOSIS — B001 Herpesviral vesicular dermatitis: Secondary | ICD-10-CM

## 2021-01-28 DIAGNOSIS — Z6829 Body mass index (BMI) 29.0-29.9, adult: Secondary | ICD-10-CM

## 2021-01-28 DIAGNOSIS — Z7689 Persons encountering health services in other specified circumstances: Secondary | ICD-10-CM | POA: Diagnosis not present

## 2021-01-28 DIAGNOSIS — R609 Edema, unspecified: Secondary | ICD-10-CM

## 2021-01-28 DIAGNOSIS — Z0000 Encounter for general adult medical examination without abnormal findings: Secondary | ICD-10-CM

## 2021-01-28 MED ORDER — ACYCLOVIR 400 MG PO TABS: ORAL_TABLET | ORAL | 2 refills | Status: DC

## 2021-01-28 MED ORDER — PHENTERMINE HCL 37.5 MG PO TABS: 37.5000 mg | ORAL_TABLET | Freq: Every day | ORAL | 0 refills | Status: DC

## 2021-01-28 MED ORDER — HYDROCHLOROTHIAZIDE 12.5 MG PO TABS: 12.5000 mg | ORAL_TABLET | Freq: Every day | ORAL | 1 refills | Status: DC

## 2021-01-28 NOTE — Patient Instructions (Addendum)
Fat and Cholesterol Restricted Eating Plan Getting too much fat and cholesterol in your diet may cause health problems. Choosing the right foods helps keep your fat and cholesterol at normal levels. This can keep you from getting certain diseases. Your doctor may recommend an eating plan that includes: Total fat: ______% or less of total calories a day. This is ______g of fat a day. Saturated fat: ______% or less of total calories a day. This is ______g of saturated fat a day. Cholesterol: less than _________mg a day. Fiber: ______g a day. What are tips for following this plan? General tips Work with your doctor to lose weight if you need to. Avoid: Foods with added sugar. Fried foods. Foods with trans fat or partially hydrogenated oils. This includes some margarines and baked goods. If you drink alcohol: Limit how much you have to: 0-1 drink a day for women who are not pregnant. 0-2 drinks a day for men. Know how much alcohol is in a drink. In the [place] one drink equals one 12 oz bottle of beer (355 mL), one 5 oz glass of wine (148 mL), or one 1 oz glass of hard liquor (44 mL). Reading food labels Check food labels for: Trans fats. Partially hydrogenated oils. Saturated fat (g) in each serving. Cholesterol (mg) in each serving. Fiber (g) in each serving. Choose foods with healthy fats, such as: Monounsaturated fats and polyunsaturated fats. These include olive and canola oil, flaxseeds, walnuts, almonds, and seeds. Omega-3 fats. These are found in certain fish, flaxseed oil, and ground flaxseeds. Choose grain products that have whole grains. Look for the word "whole" as the first word in the ingredient list. Cooking Cook foods using low-fat methods. These include baking, boiling, grilling, and broiling. Eat more home-cooked foods. Eat at restaurants and buffets less often. Eat less fast food. Avoid cooking using saturated fats, such as butter, cream, palm oil, palm kernel oil, and  coconut oil. Meal planning  At meals, divide your plate into four equal parts: Fill one-half of your plate with vegetables, green salads, and fruit. Fill one-fourth of your plate with whole grains. Fill one-fourth of your plate with low-fat (lean) protein foods. Eat fish that is high in omega-3 fats at least two times a week. This includes mackerel, tuna, sardines, and salmon. Eat foods that are high in fiber, such as whole grains, beans, apples, pears, berries, broccoli, carrots, peas, and barley. What foods should I eat? Fruits All fresh, canned (in natural juice), or frozen fruits. Vegetables Fresh or frozen vegetables (raw, steamed, roasted, or grilled). Green salads. Grains Whole grains, such as whole wheat or whole grain breads, crackers, cereals, and pasta. Unsweetened oatmeal, bulgur, barley, quinoa, or brown rice. Corn or whole wheat flour tortillas. Meats and other protein foods Ground beef (85% or leaner), grass-fed beef, or beef trimmed of fat. Skinless chicken or turkey. Ground chicken or turkey. Pork trimmed of fat. All fish and seafood. Egg whites. Dried beans, peas, or lentils. Unsalted nuts or seeds. Unsalted canned beans. Nut butters without added sugar or oil. Dairy Low-fat or nonfat dairy products, such as skim or 1% milk, 2% or reduced-fat cheeses, low-fat and fat-free ricotta or cottage cheese, or plain low-fat and nonfat yogurt. Fats and oils Tub margarine without trans fats. Light or reduced-fat mayonnaise and salad dressings. Avocado. Olive, canola, sesame, or safflower oils. The items listed above may not be a complete list of foods and beverages you can eat. Contact a dietitian for more information. What foods   should I avoid? Fruits Canned fruit in heavy syrup. Fruit in cream or butter sauce. Fried fruit. Vegetables Vegetables cooked in cheese, cream, or butter sauce. Fried vegetables. Grains White bread. White pasta. White rice. Cornbread. Bagels, pastries,  and croissants. Crackers and snack foods that contain trans fat and hydrogenated oils. Meats and other protein foods Fatty cuts of meat. Ribs, chicken wings, bacon, sausage, bologna, salami, chitterlings, fatback, hot dogs, bratwurst, and packaged lunch meats. Liver and organ meats. Whole eggs and egg yolks. Chicken and turkey with skin. Fried meat. Dairy Whole or 2% milk, cream, half-and-half, and cream cheese. Whole milk cheeses. Whole-fat or sweetened yogurt. Full-fat cheeses. Nondairy creamers and whipped toppings. Processed cheese, cheese spreads, and cheese curds. Fats and oils Butter, stick margarine, lard, shortening, ghee, or bacon fat. Coconut, palm kernel, and palm oils. Beverages Alcohol. Sugar-sweetened drinks such as sodas, lemonade, and fruit drinks. Sweets and desserts Corn syrup, sugars, honey, and molasses. Candy. Jam and jelly. Syrup. Sweetened cereals. Cookies, pies, cakes, donuts, muffins, and ice cream. The items listed above may not be a complete list of foods and beverages you should avoid. Contact a dietitian for more information. Summary Choosing the right foods helps keep your fat and cholesterol at normal levels. This can keep you from getting certain diseases. At meals, fill one-half of your plate with vegetables, green salads, and fruits. Eat high fiber foods, like whole grains, beans, apples, pears, berries, carrots, peas, and barley. Limit added sugar, saturated fats, alcohol, and fried foods. This information is not intended to replace advice given to you by your health care provider. Make sure you discuss any questions you have with your health care provider. Document Revised: 05/15/2020 Document Reviewed: 05/15/2020 Elsevier Patient Education  2022 [employer] Inc[employer]  Fat and Cholesterol Restricted Eating Plan Getting too much fat and cholesterol in your diet may cause health problems. Choosing the right foods helps keep your fat and cholesterol at normal levels.  This can keep you from getting certain diseases. Your doctor may recommend an eating plan that includes: Total fat: ______% or less of total calories a day. This is ______g of fat a day. Saturated fat: ______% or less of total calories a day. This is ______g of saturated fat a day. Cholesterol: less than _________mg a day. Fiber: ______g a day. What are tips for following this plan? General tips Work with your doctor to lose weight if you need to. Avoid: Foods with added sugar. Fried foods. Foods with trans fat or partially hydrogenated oils. This includes some margarines and baked goods. If you drink alcohol: Limit how much you have to: 0-1 drink a day for women who are not pregnant. 0-2 drinks a day for men. Know how much alcohol is in a drink. In the [place] one drink equals one 12 oz bottle of beer (355 mL), one 5 oz glass of wine (148 mL), or one 1 oz glass of hard liquor (44 mL). Reading food labels Check food labels for: Trans fats. Partially hydrogenated oils. Saturated fat (g) in each serving. Cholesterol (mg) in each serving. Fiber (g) in each serving. Choose foods with healthy fats, such as: Monounsaturated fats and polyunsaturated fats. These include olive and canola oil, flaxseeds, walnuts, almonds, and seeds. Omega-3 fats. These are found in certain fish, flaxseed oil, and ground flaxseeds. Choose grain products that have whole grains. Look for the word "whole" as the first word in the ingredient list. Cooking Cook foods using low-fat methods. These include baking, boiling, grilling,   and broiling. Eat more home-cooked foods. Eat at restaurants and buffets less often. Eat less fast food. Avoid cooking using saturated fats, such as butter, cream, palm oil, palm kernel oil, and coconut oil. Meal planning  At meals, divide your plate into four equal parts: Fill one-half of your plate with vegetables, green salads, and fruit. Fill one-fourth of your plate with whole  grains. Fill one-fourth of your plate with low-fat (lean) protein foods. Eat fish that is high in omega-3 fats at least two times a week. This includes mackerel, tuna, sardines, and salmon. Eat foods that are high in fiber, such as whole grains, beans, apples, pears, berries, broccoli, carrots, peas, and barley. What foods should I eat? Fruits All fresh, canned (in natural juice), or frozen fruits. Vegetables Fresh or frozen vegetables (raw, steamed, roasted, or grilled). Green salads. Grains Whole grains, such as whole wheat or whole grain breads, crackers, cereals, and pasta. Unsweetened oatmeal, bulgur, barley, quinoa, or brown rice. Corn or whole wheat flour tortillas. Meats and other protein foods Ground beef (85% or leaner), grass-fed beef, or beef trimmed of fat. Skinless chicken or turkey. Ground chicken or turkey. Pork trimmed of fat. All fish and seafood. Egg whites. Dried beans, peas, or lentils. Unsalted nuts or seeds. Unsalted canned beans. Nut butters without added sugar or oil. Dairy Low-fat or nonfat dairy products, such as skim or 1% milk, 2% or reduced-fat cheeses, low-fat and fat-free ricotta or cottage cheese, or plain low-fat and nonfat yogurt. Fats and oils Tub margarine without trans fats. Light or reduced-fat mayonnaise and salad dressings. Avocado. Olive, canola, sesame, or safflower oils. The items listed above may not be a complete list of foods and beverages you can eat. Contact a dietitian for more information. What foods should I avoid? Fruits Canned fruit in heavy syrup. Fruit in cream or butter sauce. Fried fruit. Vegetables Vegetables cooked in cheese, cream, or butter sauce. Fried vegetables. Grains White bread. White pasta. White rice. Cornbread. Bagels, pastries, and croissants. Crackers and snack foods that contain trans fat and hydrogenated oils. Meats and other protein foods Fatty cuts of meat. Ribs, chicken wings, bacon, sausage, bologna, salami,  chitterlings, fatback, hot dogs, bratwurst, and packaged lunch meats. Liver and organ meats. Whole eggs and egg yolks. Chicken and turkey with skin. Fried meat. Dairy Whole or 2% milk, cream, half-and-half, and cream cheese. Whole milk cheeses. Whole-fat or sweetened yogurt. Full-fat cheeses. Nondairy creamers and whipped toppings. Processed cheese, cheese spreads, and cheese curds. Fats and oils Butter, stick margarine, lard, shortening, ghee, or bacon fat. Coconut, palm kernel, and palm oils. Beverages Alcohol. Sugar-sweetened drinks such as sodas, lemonade, and fruit drinks. Sweets and desserts Corn syrup, sugars, honey, and molasses. Candy. Jam and jelly. Syrup. Sweetened cereals. Cookies, pies, cakes, donuts, muffins, and ice cream. The items listed above may not be a complete list of foods and beverages you should avoid. Contact a dietitian for more information. Summary Choosing the right foods helps keep your fat and cholesterol at normal levels. This can keep you from getting certain diseases. At meals, fill one-half of your plate with vegetables, green salads, and fruits. Eat high fiber foods, like whole grains, beans, apples, pears, berries, carrots, peas, and barley. Limit added sugar, saturated fats, alcohol, and fried foods. This information is not intended to replace advice given to you by your health care provider. Make sure you discuss any questions you have with your health care provider. Document Revised: 05/15/2020 Document Reviewed: 05/15/2020 Elsevier Patient Education    2022 Elsevier Inc.  

## 2021-01-28 NOTE — Progress Notes (Signed)
New Patient Office Visit  Subjective:  Patient ID: Ann Newton, female    DOB: [birth year]  Age: 66 y.o. MRN: [id number]  CC:  Chief Complaint  Patient presents with   New Patient (Initial Visit)    HPI Ann Newton presents to establish new primary care provider. She was a patient here in the past. She did have seek care in another facility, but has decided to return to this office.  She does see rheumatologist. Does have psoriatic arthritis.  She does see a gyn for well woman care and mammograms. GYN did do partial hysterectomy and bladder tuck for her. She plans to continue to see GYN for these services.  Concerned about weight. Gyn provider has prescribed phentermine for her in the past. She states she has been able to lose weight on this medication without negative side effects. She would like to try this again.  Sometimes needs prescription for Zovirax to treat fever blisters. Will take HCTZ as needed for fluid retention. She states that she will generally have to take this for five days every three months or so.   Past Medical History:  Diagnosis Date   Arthritis    Hyperlipidemia    diet controlled   Psoriasis    Thyroid disease    Wears contact lenses     Past Surgical History:  Procedure Laterality Date   ABDOMINAL HYSTERECTOMY  2000   part   ANTERIOR AND POSTERIOR REPAIR N/A 09/08/2016   Procedure: ANTERIOR (CYSTOCELE) AND POSTERIOR REPAIR (RECTOCELE)/Perineorrhaphy;  Surgeon: Brien Few, MD;  Location: [facility];  Service: Gynecology;  Laterality: N/A;  Requests 49mn. Repair of enterocele/posterior   COLONOSCOPY     ~12 yrs ago- normal per pt    COSMETIC SURGERY  1999   breast implants   HAGLAND'S DEFORMITY EXCISION Left 09/13/2013   Procedure: LEFT FOOT: EXCISION INTERDIGITAL MORTON'S NEUROMA SINGLE EACH;  Surgeon: WYvette Rack, MD;  [place] [facility]  Service: Orthopedics;  Laterality: Left;   KNEE ARTHROSCOPY  2013,2011    right/left   SHOULDER ARTHROSCOPY     rt   TUBAL LIGATION     WISDOM TOOTH EXTRACTION      Family History  Problem Relation Age of Onset   Kidney disease Mother    Diabetes Mother    Heart attack Father    Rheum arthritis Brother    Healthy Daughter    Healthy Son    Heart attack Maternal Grandmother    Heart attack Maternal Grandfather    Cancer Paternal Grandmother    Heart attack Paternal Grandfather    Rheum arthritis Brother    Colon cancer Neg Hx    Colon polyps Neg Hx    Esophageal cancer Neg Hx    Rectal cancer Neg Hx    Stomach cancer Neg Hx     Social History   Socioeconomic History   Marital status: Married    Spouse name: Not on file   Number of children: Not on file   Years of education: Not on file   Highest education level: Not on file  Occupational History   Not on file  Tobacco Use   Smoking status: Former    Packs/day: 0.50    Years: 10.00    Pack years: 5.00    Types: Cigarettes    Quit date: 09/12/2012    Years since quitting: 8.4   Smokeless tobacco: Never  Vaping Use   Vaping Use: Never used  Substance and Sexual Activity   Alcohol use: No   Drug use: No   Sexual activity: Yes    Birth control/protection: Surgical  Other Topics Concern   Not on file  Social History Narrative   Right handed    Social Determinants of Health   Financial Resource Strain: Not on file  Food Insecurity: Not on file  Transportation Needs: Not on file  Physical Activity: Not on file  Stress: Not on file  Social Connections: Not on file  Intimate Partner Violence: Not on file    ROS Review of Systems  Constitutional:  Negative for activity change, appetite change, chills, fatigue and fever.  HENT:  Negative for congestion, postnasal drip, rhinorrhea, sinus pressure, sinus pain, sneezing and sore throat.   Eyes: Negative.   Respiratory:  Negative for cough, chest tightness, shortness of breath and wheezing.   Cardiovascular:  Negative for chest pain  and palpitations.  Gastrointestinal:  Negative for abdominal pain, constipation, diarrhea, nausea and vomiting.  Endocrine: Negative for cold intolerance, heat intolerance, polydipsia and polyuria.  Genitourinary:  Negative for dyspareunia, dysuria, flank pain, frequency and urgency.  Musculoskeletal:  Negative for arthralgias, back pain and myalgias.       Patient sees rheumatology for treatment of psoriatic arthritis.  Skin:  Negative for rash.  Allergic/Immunologic: Negative for environmental allergies.  Neurological:  Negative for dizziness, weakness and headaches.  Hematological:  Negative for adenopathy.  Psychiatric/Behavioral:  The patient is not nervous/anxious.    Objective:   Today's Vitals   01/28/21 1456  BP: 111/71  Pulse: 80  Temp: 98 F (36.7 C)  SpO2: 95%  Weight: 196 lb 6.4 oz (89.1 kg)  Height: _0  (1.727 m)   Body mass index is 29.86 kg/m.   Physical Exam Vitals and nursing note reviewed.  Constitutional:      Appearance: Normal appearance. She is well-developed.  HENT:     Head: Normocephalic and atraumatic.  Eyes:     Pupils: Pupils are equal, round, and reactive to light.  Cardiovascular:     Rate and Rhythm: Normal rate and regular rhythm.     Pulses: Normal pulses.     Heart sounds: Normal heart sounds.  Pulmonary:     Effort: Pulmonary effort is normal.     Breath sounds: Normal breath sounds.  Abdominal:     Palpations: Abdomen is soft.  Musculoskeletal:        General: Normal range of motion.     Cervical back: Normal range of motion and neck supple.  Lymphadenopathy:     Cervical: No cervical adenopathy.  Skin:    General: Skin is warm and dry.     Capillary Refill: Capillary refill takes less than 2 seconds.  Neurological:     General: No focal deficit present.     Mental Status: She is alert and oriented to person, place, and time.  Psychiatric:        Mood and Affect: Mood normal.        Behavior: Behavior normal.         Thought Content: Thought content normal.        Judgment: Judgment normal.    Assessment & Plan:  1. Encounter to establish care Appointment today to establish new primary care provider.   2. Body mass index 29.0-29.9, adult Discussed lowering calorie intake to 1500 calories per day and incorporating exercise into daily routine to help lose weight.  Trial of phentermine 37.5 mg.  Take 1  daily in the morning.  Advised her to monitor for negative side effects which include tachycardia and jitteriness, dry mouth, and constipation.  She should drink plenty of water.  The patient voiced understanding and agreement.  We will follow-up in 30 days for further assessment. - phentermine (ADIPEX-P) 37.5 MG tablet; Take 1 tablet (37.5 mg total) by mouth daily before breakfast.  Dispense: 30 tablet; Refill: 0  3. Fever blister Acyclovir 400 mg 3 times daily for 5 days.  She should start at first sign of fever blister.  New prescription sent to pharmacy today. - acyclovir (ZOVIRAX) 400 MG tablet; Take 1 tablet po TID for 5 days. Begin taking at first sign of fever blister  Dispense: 30 tablet; Refill: 2  4. Edema, unspecified type Thiazide 25 mg tablets daily when needed for swelling. - hydrochlorothiazide (HYDRODIURIL) 12.5 MG tablet; Take 1 tablet (12.5 mg total) by mouth daily.  Dispense: 90 tablet; Refill: 1  5. Healthcare maintenance Routine, fasting labs were drawn during today's visit.  Will fill out employee health form when results are returned and reviewed. - CBC; Future - Comp Met (CMET); Future - Hemoglobin A1c; Future - Lipid panel; Future - TSH; Future - Lipid panel - Hemoglobin A1c - Comp Met (CMET) - TSH - CBC   Problem List Items Addressed This Visit       Digestive   Fever blister   Relevant Medications   acyclovir (ZOVIRAX) 400 MG tablet     Other   Body mass index 29.0-29.9, adult   Relevant Medications   phentermine (ADIPEX-P) 37.5 MG tablet   Healthcare  maintenance   Relevant Orders   CBC (Completed)   Comp Met (CMET) (Completed)   Hemoglobin A1c (Completed)   Lipid panel (Completed)   TSH (Completed)   Edema   Relevant Medications   hydrochlorothiazide (HYDRODIURIL) 12.5 MG tablet   Other Visit Diagnoses     Encounter to establish care    -  Primary       Outpatient Encounter Medications as of 01/28/2021  Medication Sig   calcipotriene (DOVONOX) 0.005 % ointment APPLY TO AFFECTED AREA ON THE SKIN DAILY.   clobetasol ointment (TEMOVATE) 0.05 % Apply topically 2 (two) times daily.   diclofenac sodium (VOLTAREN) 1 % GEL 3 grams to 3 large joints up to 3 times daily   hydrochlorothiazide (HYDRODIURIL) 12.5 MG tablet Take 1 tablet (12.5 mg total) by mouth daily.   meloxicam (MOBIC) 7.5 MG tablet Take 7.5 mg by mouth as needed.   Multiple Vitamins-Minerals (CENTRUM SILVER 50+WOMEN) TABS Take 1 tablet by mouth daily.   phentermine (ADIPEX-P) 37.5 MG tablet Take 1 tablet (37.5 mg total) by mouth daily before breakfast.   [DISCONTINUED] acyclovir (ZOVIRAX) 400 MG tablet Take 400 mg by mouth as needed.   [DISCONTINUED] Guselkumab (TREMFYA) 100 MG/ML SOPN INJECT 100MG SUBCUTANEOUSLY EVERY 8 WEEKS   acyclovir (ZOVIRAX) 400 MG tablet Take 1 tablet po TID for 5 days. Begin taking at first sign of fever blister   calcipotriene (DOVONOX) 0.005 % cream Apply topically daily.   No facility-administered encounter medications on file as of 01/28/2021.    Follow-up: Return in 4 weeks (on 02/25/2021) for routine - weight management.   Ronnell Freshwater, NP  This note was dictated using Systems analyst. Rapid proofreading was performed to expedite the delivery of the information. Despite proofreading, phonetic errors will occur which are common with this voice recognition software. Please take this into consideration. If there are  any concerns, please contact our office.

## 2021-01-29 LAB — COMPREHENSIVE METABOLIC PANEL
ALT: 28 IU/L (ref 0–32)
AST: 25 IU/L (ref 0–40)
Albumin/Globulin Ratio: 1.7 (ref 1.2–2.2)
Albumin: 4.5 g/dL (ref 3.8–4.8)
Alkaline Phosphatase: 141 IU/L — ABNORMAL HIGH (ref 44–121)
BUN/Creatinine Ratio: 18 (ref 12–28)
BUN: 14 mg/dL (ref 8–27)
Bilirubin Total: 0.3 mg/dL (ref 0.0–1.2)
CO2: 26 mmol/L (ref 20–29)
Calcium: 9.9 mg/dL (ref 8.7–10.3)
Chloride: 100 mmol/L (ref 96–106)
Creatinine, Ser: 0.8 mg/dL (ref 0.57–1.00)
Globulin, Total: 2.6 g/dL (ref 1.5–4.5)
Glucose: 84 mg/dL (ref 70–99)
Potassium: 4.5 mmol/L (ref 3.5–5.2)
Sodium: 141 mmol/L (ref 134–144)
Total Protein: 7.1 g/dL (ref 6.0–8.5)
eGFR: 82 mL/min/{1.73_m2} (ref >59)

## 2021-01-29 LAB — CBC
Hematocrit: 40.1 % (ref 34.0–46.6)
Hemoglobin: 14 g/dL (ref 11.1–15.9)
MCH: 30.3 pg (ref 26.6–33.0)
MCHC: 34.9 g/dL (ref 31.5–35.7)
MCV: 87 fL (ref 79–97)
Platelets: 415 10*3/uL (ref 150–450)
RBC: 4.62 x10E6/uL (ref 3.77–5.28)
RDW: 12.7 % (ref 11.7–15.4)
WBC: 8.8 10*3/uL (ref 3.4–10.8)

## 2021-01-29 LAB — TSH: TSH: 1.04 u[IU]/mL (ref 0.450–4.500)

## 2021-01-29 LAB — LIPID PANEL
Chol/HDL Ratio: 6.4 ratio — ABNORMAL HIGH (ref 0.0–4.4)
Cholesterol, Total: 325 mg/dL — ABNORMAL HIGH (ref 100–199)
HDL: 51 mg/dL (ref >39)
LDL Chol Calc (NIH): 232 mg/dL — ABNORMAL HIGH (ref 0–99)
Triglycerides: 211 mg/dL — ABNORMAL HIGH (ref 0–149)
VLDL Cholesterol Cal: 42 mg/dL — ABNORMAL HIGH (ref 5–40)

## 2021-01-29 LAB — HEMOGLOBIN A1C
Est. average glucose Bld gHb Est-mCnc: 111 mg/dL
Hgb A1c MFr Bld: 5.5 % (ref 4.8–5.6)

## 2021-02-02 ENCOUNTER — Other Ambulatory Visit: Payer: Self-pay

## 2021-02-02 ENCOUNTER — Ambulatory Visit (INDEPENDENT_AMBULATORY_CARE_PROVIDER_SITE_OTHER): Payer: 59

## 2021-02-02 DIAGNOSIS — Z23 Encounter for immunization: Secondary | ICD-10-CM | POA: Diagnosis not present

## 2021-02-04 ENCOUNTER — Other Ambulatory Visit: Payer: Self-pay | Admitting: Physician Assistant

## 2021-02-04 DIAGNOSIS — L403 Pustulosis palmaris et plantaris: Secondary | ICD-10-CM

## 2021-02-04 DIAGNOSIS — R609 Edema, unspecified: Secondary | ICD-10-CM | POA: Insufficient documentation

## 2021-02-04 DIAGNOSIS — L4050 Arthropathic psoriasis, unspecified: Secondary | ICD-10-CM

## 2021-02-04 DIAGNOSIS — B001 Herpesviral vesicular dermatitis: Secondary | ICD-10-CM | POA: Insufficient documentation

## 2021-02-04 NOTE — Telephone Encounter (Signed)
Next Visit: 03/24/2021  Last Visit: 12/24/2020  Last Fill: 11/04/2020  GS:[id number] arthropathy   Current Dose per office note 12/24/2020: Tremfya 100 mg subcutaneous injections every 8 weeks  Labs: 01/28/2021 Alk, Phos 141  TB Gold: 07/09/2020 Neg    Okay to refill Tremfya?

## 2021-02-25 ENCOUNTER — Encounter: Payer: Self-pay | Admitting: Nurse Practitioner

## 2021-02-25 ENCOUNTER — Other Ambulatory Visit: Payer: Self-pay

## 2021-02-25 ENCOUNTER — Ambulatory Visit (INDEPENDENT_AMBULATORY_CARE_PROVIDER_SITE_OTHER): Payer: 59 | Admitting: Nurse Practitioner

## 2021-02-25 VITALS — BP 104/62 | HR 90 | Temp 98.2°F | Ht 68.0 in | Wt 190.1 lb

## 2021-02-25 DIAGNOSIS — E782 Mixed hyperlipidemia: Secondary | ICD-10-CM

## 2021-02-25 DIAGNOSIS — R634 Abnormal weight loss: Secondary | ICD-10-CM | POA: Diagnosis not present

## 2021-02-25 DIAGNOSIS — Z6828 Body mass index (BMI) 28.0-28.9, adult: Secondary | ICD-10-CM

## 2021-02-25 MED ORDER — PHENTERMINE HCL 37.5 MG PO TABS: 37.5000 mg | ORAL_TABLET | Freq: Every day | ORAL | 1 refills | Status: DC

## 2021-02-25 NOTE — Progress Notes (Signed)
Established patient visit   Patient: Ann Newton   DOB: 07-02-1955   66 y.o. Female  MRN: [id number] Visit Date: 02/25/2021   Chief Complaint  Patient presents with   Weight Loss   Subjective    HPI  The patient presents for follow up of weight management. Started taking phentermine at her most recent visit. She has lost 6 pounds since she was last seen. States that she is exercising more frequently. She is more closely monitoring her portion sizes and has cut her calorie count to 1500 calories. She reports not negative side effects from taking phentermine.  Of note, patient did have routine, fasting labs done since her most recent visit. She did have a significant increase in her lipid panel since last year. She believes she was fasting prior to the lab draw.   Lab Results  Component Value Date   CHOL 325 (H) 01/28/2021   CHOL 218 (H) 12/11/2019   CHOL 228 (H) 05/29/2019   Lab Results  Component Value Date   HDL 51 01/28/2021   HDL 50 12/11/2019   HDL 42.70 05/29/2019   Lab Results  Component Value Date   LDLCALC 232 (H) 01/28/2021   LDLCALC 129 (H) 12/11/2019   LDLCALC 161 (H) 05/29/2019   Lab Results  Component Value Date   TRIG 211 (H) 01/28/2021   TRIG 218 (H) 12/11/2019   TRIG 122.0 05/29/2019   Lab Results  Component Value Date   CHOLHDL 6.4 (H) 01/28/2021   CHOLHDL 5 05/29/2019   CHOLHDL 7 11/09/2018   Lab Results  Component Value Date   LDLDIRECT 161.0 05/29/2019   LDLDIRECT 221.0 11/09/2018   LDLDIRECT 225.0 11/22/2017    She currently takes no medications to lower cholesterol.  She has no new concerns or complaints today.   Medications: Outpatient Medications Prior to Visit  Medication Sig   acyclovir (ZOVIRAX) 400 MG tablet Take 1 tablet po TID for 5 days. Begin taking at first sign of fever blister   calcipotriene (DOVONOX) 0.005 % ointment APPLY TO AFFECTED AREA ON THE SKIN DAILY.   clobetasol ointment (TEMOVATE) 0.05 % Apply topically 2  (two) times daily.   diclofenac sodium (VOLTAREN) 1 % GEL 3 grams to 3 large joints up to 3 times daily   hydrochlorothiazide (HYDRODIURIL) 12.5 MG tablet Take 1 tablet (12.5 mg total) by mouth daily.   meloxicam (MOBIC) 7.5 MG tablet Take 7.5 mg by mouth as needed.   Multiple Vitamins-Minerals (CENTRUM SILVER 50+WOMEN) TABS Take 1 tablet by mouth daily.   TREMFYA 100 MG/ML SOPN INJECT 100MG SUBCUTANEOUSLY EVERY 8 WEEKS   [DISCONTINUED] phentermine (ADIPEX-P) 37.5 MG tablet Take 1 tablet (37.5 mg total) by mouth daily before breakfast.   No facility-administered medications prior to visit.    Review of Systems  Constitutional:  Negative for activity change, appetite change, chills, fatigue and fever.       Six pound weight loss since her last visit   HENT:  Negative for congestion, postnasal drip, rhinorrhea, sinus pressure, sinus pain, sneezing and sore throat.   Eyes: Negative.   Respiratory:  Negative for cough, chest tightness, shortness of breath and wheezing.   Cardiovascular:  Negative for chest pain and palpitations.  Gastrointestinal:  Negative for abdominal pain, constipation, diarrhea, nausea and vomiting.  Endocrine: Negative for cold intolerance, heat intolerance, polydipsia and polyuria.  Genitourinary:  Negative for dyspareunia, dysuria, flank pain, frequency and urgency.  Musculoskeletal:  Negative for arthralgias, back pain and myalgias.  Skin:  Negative for rash.  Allergic/Immunologic: Negative for environmental allergies.  Neurological:  Negative for dizziness, weakness and headaches.  Hematological:  Negative for adenopathy.  Psychiatric/Behavioral:  The patient is not nervous/anxious.    Last CBC Lab Results  Component Value Date   WBC 8.8 01/28/2021   HGB 14.0 01/28/2021   HCT 40.1 01/28/2021   MCV 87 01/28/2021   MCH 30.3 01/28/2021   RDW 12.7 01/28/2021   PLT 415 21/19/4174   Last metabolic panel Lab Results  Component Value Date   GLUCOSE 84  01/28/2021   NA 141 01/28/2021   K 4.5 01/28/2021   CL 100 01/28/2021   CO2 26 01/28/2021   BUN 14 01/28/2021   CREATININE 0.80 01/28/2021   EGFR 82 01/28/2021   CALCIUM 9.9 01/28/2021   PROT 7.1 01/28/2021   ALBUMIN 4.5 01/28/2021   LABGLOB 2.6 01/28/2021   AGRATIO 1.7 01/28/2021   BILITOT 0.3 01/28/2021   ALKPHOS 141 (H) 01/28/2021   AST 25 01/28/2021   ALT 28 01/28/2021   ANIONGAP 8 09/02/2016   Last hemoglobin A1c Lab Results  Component Value Date   HGBA1C 5.5 01/28/2021   Last thyroid functions Lab Results  Component Value Date   TSH 1.040 01/28/2021       Objective     Today's Vitals   02/25/21 0857  BP: 104/62  Pulse: 90  Temp: 98.2 F (36.8 C)  SpO2: 97%  Weight: 190 lb 1.6 oz (86.2 kg)  Height: _0  (1.727 m)   Body mass index is 28.9 kg/m.    BP Readings from Last 3 Encounters:  02/25/21 104/62  01/28/21 111/71  12/24/20 108/64    Wt Readings from Last 3 Encounters:  02/25/21 190 lb 1.6 oz (86.2 kg)  01/28/21 196 lb 6.4 oz (89.1 kg)  12/24/20 193 lb 6.4 oz (87.7 kg)      Physical Exam Vitals and nursing note reviewed.  Constitutional:      Appearance: Normal appearance. She is well-developed.  HENT:     Head: Normocephalic and atraumatic.  Eyes:     Pupils: Pupils are equal, round, and reactive to light.  Cardiovascular:     Rate and Rhythm: Normal rate and regular rhythm.     Pulses: Normal pulses.     Heart sounds: Normal heart sounds.  Pulmonary:     Effort: Pulmonary effort is normal.     Breath sounds: Normal breath sounds.  Abdominal:     Palpations: Abdomen is soft.  Musculoskeletal:        General: Normal range of motion.     Cervical back: Normal range of motion and neck supple.  Lymphadenopathy:     Cervical: No cervical adenopathy.  Skin:    General: Skin is warm and dry.     Capillary Refill: Capillary refill takes less than 2 seconds.  Neurological:     General: No focal deficit present.     Mental Status:  She is alert and oriented to person, place, and time.  Psychiatric:        Mood and Affect: Mood normal.        Behavior: Behavior normal.        Thought Content: Thought content normal.        Judgment: Judgment normal.      Assessment & Plan    1. Weight loss Patient doing well on current dose phentermine with 6 pound weight loss. Will continue for additional two months.   2. Body mass  index 28.0-28.9, adult Discussed lowering calorie intake to 1500 calories per day and incorporating exercise into daily routine to help lose weight. Will reassess in two months.   - phentermine (ADIPEX-P) 37.5 MG tablet; Take 1 tablet (37.5 mg total) by mouth daily before breakfast.  Dispense: 30 tablet; Refill: 1  3. Mixed hyperlipidemia Marked increase in cholesterol panel since one year ago. Recommended the patient limit intake of fried and fatty foods. Increase intake of lean proteins and green leafy vegetables. Also recommended increased physical activity.  Encouraged her to add fish oil daily. May take up to three capsules daily. Will recheck fasting lipid panel in 5 months. If continues to be elevated, will add statin. Patient agrees with this plan.    Problem List Items Addressed This Visit       Other   Body mass index 29.0-29.9, adult   Relevant Medications   phentermine (ADIPEX-P) 37.5 MG tablet   Mixed hyperlipidemia   Weight loss - Primary     Return in about 2 months (around 04/25/2021) for routine - weight management.         Ronnell Freshwater, NP  [facility] [phone] (phone) [phone] (fax)  [place]

## 2021-02-25 NOTE — Patient Instructions (Signed)
Fat and Cholesterol Restricted Eating Plan Getting too much fat and cholesterol in your diet may cause health problems. Choosing the right foods helps keep your fat and cholesterol at normal levels. This can keep you from getting certain diseases. Your doctor may recommend an eating plan that includes: Total fat: ______% or less of total calories a day. This is ______g of fat a day. Saturated fat: ______% or less of total calories a day. This is ______g of saturated fat a day. Cholesterol: less than _________mg a day. Fiber: ______g a day. What are tips for following this plan? General tips Work with your doctor to lose weight if you need to. Avoid: Foods with added sugar. Fried foods. Foods with trans fat or partially hydrogenated oils. This includes some margarines and baked goods. If you drink alcohol: Limit how much you have to: 0-1 drink a day for women who are not pregnant. 0-2 drinks a day for men. Know how much alcohol is in a drink. In the [place] one drink equals one 12 oz bottle of beer (355 mL), one 5 oz glass of wine (148 mL), or one 1 oz glass of hard liquor (44 mL). Reading food labels Check food labels for: Trans fats. Partially hydrogenated oils. Saturated fat (g) in each serving. Cholesterol (mg) in each serving. Fiber (g) in each serving. Choose foods with healthy fats, such as: Monounsaturated fats and polyunsaturated fats. These include olive and canola oil, flaxseeds, walnuts, almonds, and seeds. Omega-3 fats. These are found in certain fish, flaxseed oil, and ground flaxseeds. Choose grain products that have whole grains. Look for the word "whole" as the first word in the ingredient list. Cooking Cook foods using low-fat methods. These include baking, boiling, grilling, and broiling. Eat more home-cooked foods. Eat at restaurants and buffets less often. Eat less fast food. Avoid cooking using saturated fats, such as butter, cream, palm oil, palm kernel oil, and  coconut oil. Meal planning  At meals, divide your plate into four equal parts: Fill one-half of your plate with vegetables, green salads, and fruit. Fill one-fourth of your plate with whole grains. Fill one-fourth of your plate with low-fat (lean) protein foods. Eat fish that is high in omega-3 fats at least two times a week. This includes mackerel, tuna, sardines, and salmon. Eat foods that are high in fiber, such as whole grains, beans, apples, pears, berries, broccoli, carrots, peas, and barley. What foods should I eat? Fruits All fresh, canned (in natural juice), or frozen fruits. Vegetables Fresh or frozen vegetables (raw, steamed, roasted, or grilled). Green salads. Grains Whole grains, such as whole wheat or whole grain breads, crackers, cereals, and pasta. Unsweetened oatmeal, bulgur, barley, quinoa, or brown rice. Corn or whole wheat flour tortillas. Meats and other protein foods Ground beef (85% or leaner), grass-fed beef, or beef trimmed of fat. Skinless chicken or turkey. Ground chicken or turkey. Pork trimmed of fat. All fish and seafood. Egg whites. Dried beans, peas, or lentils. Unsalted nuts or seeds. Unsalted canned beans. Nut butters without added sugar or oil. Dairy Low-fat or nonfat dairy products, such as skim or 1% milk, 2% or reduced-fat cheeses, low-fat and fat-free ricotta or cottage cheese, or plain low-fat and nonfat yogurt. Fats and oils Tub margarine without trans fats. Light or reduced-fat mayonnaise and salad dressings. Avocado. Olive, canola, sesame, or safflower oils. The items listed above may not be a complete list of foods and beverages you can eat. Contact a dietitian for more information. What foods   should I avoid? Fruits Canned fruit in heavy syrup. Fruit in cream or butter sauce. Fried fruit. Vegetables Vegetables cooked in cheese, cream, or butter sauce. Fried vegetables. Grains White bread. White pasta. White rice. Cornbread. Bagels, pastries,  and croissants. Crackers and snack foods that contain trans fat and hydrogenated oils. Meats and other protein foods Fatty cuts of meat. Ribs, chicken wings, bacon, sausage, bologna, salami, chitterlings, fatback, hot dogs, bratwurst, and packaged lunch meats. Liver and organ meats. Whole eggs and egg yolks. Chicken and turkey with skin. Fried meat. Dairy Whole or 2% milk, cream, half-and-half, and cream cheese. Whole milk cheeses. Whole-fat or sweetened yogurt. Full-fat cheeses. Nondairy creamers and whipped toppings. Processed cheese, cheese spreads, and cheese curds. Fats and oils Butter, stick margarine, lard, shortening, ghee, or bacon fat. Coconut, palm kernel, and palm oils. Beverages Alcohol. Sugar-sweetened drinks such as sodas, lemonade, and fruit drinks. Sweets and desserts Corn syrup, sugars, honey, and molasses. Candy. Jam and jelly. Syrup. Sweetened cereals. Cookies, pies, cakes, donuts, muffins, and ice cream. The items listed above may not be a complete list of foods and beverages you should avoid. Contact a dietitian for more information. Summary Choosing the right foods helps keep your fat and cholesterol at normal levels. This can keep you from getting certain diseases. At meals, fill one-half of your plate with vegetables, green salads, and fruits. Eat high fiber foods, like whole grains, beans, apples, pears, berries, carrots, peas, and barley. Limit added sugar, saturated fats, alcohol, and fried foods. This information is not intended to replace advice given to you by your health care provider. Make sure you discuss any questions you have with your health care provider. Document Revised: 05/15/2020 Document Reviewed: 05/15/2020 Elsevier Patient Education  2022 Else[employer] Inc.  

## 2021-03-10 NOTE — Progress Notes (Deleted)
? ?Office Visit Note ? ?Patient: Ann Newton             ?Date of Birth: [birth year]           ?MRN: [id number]             ?PCP: Ronnell Freshwater, NP ?Referring: Libby Maw,* ?Visit Date: 03/24/2021 ?Occupation: @GUAROCC @ ? ?Subjective:  ?No chief complaint on file. ? ? ?History of Present Illness: Ann Newton is a 65 y.o. female ***  ? ?Activities of Daily Living:  ?Patient reports morning stiffness for *** {minute/hour:19697}.   ?Patient {ACTIONS;DENIES/REPORTS:21021675::"Denies"} nocturnal pain.  ?Difficulty dressing/grooming: {ACTIONS;DENIES/REPORTS:21021675::"Denies"} ?Difficulty climbing stairs: {ACTIONS;DENIES/REPORTS:21021675::"Denies"} ?Difficulty getting out of chair: {ACTIONS;DENIES/REPORTS:21021675::"Denies"} ?Difficulty using hands for taps, buttons, cutlery, and/or writing: {ACTIONS;DENIES/REPORTS:21021675::"Denies"} ? ?No Rheumatology ROS completed.  ? ?PMFS History:  ?Patient Active Problem List  ? Diagnosis Date Noted  ? Weight loss 02/25/2021  ? Fever blister 02/04/2021  ? Edema 02/04/2021  ? Thyroid nodule greater than or equal to 1 cm in diameter incidentally noted on imaging study 11/20/2018  ? Fatigue 11/09/2018  ? Elevated glucose 11/09/2018  ? Snores 11/09/2018  ? Hair loss 11/09/2018  ? Need for influenza vaccination 11/09/2018  ? Thyromegaly 11/09/2018  ? Allergic reaction 05/17/2018  ? Maculopapular rash 05/17/2018  ? Family history of rheumatoid arthritis 03/09/2018  ? History of vitamin D deficiency 03/09/2018  ? Primary osteoarthritis of both knees 02/22/2018  ? Primary osteoarthritis of both hands 02/22/2018  ? Neuropathy 11/22/2017  ? Chronic pain of both knees 11/22/2017  ? Need for shingles vaccine 11/22/2017  ? Vitamin D deficiency 01/24/2016  ? Smoking hx- ( less 1 ppd * 15 yrs) - Quit Jan 17 2013 12/28/2015  ? Elevated LDL cholesterol level 12/28/2015  ? Elevated vitamin B12 level 12/28/2015  ? h/o Mild peripheral edema- takes HCTZ for this 12/20/2015  ?  Dietary B12 deficiency 12/20/2015  ? History of chickenpox ( Aug '17 )  12/20/2015  ? Healthcare maintenance 12/20/2015  ? Body mass index 29.0-29.9, adult 12/03/2015  ? h/o Hypertriglyceridemia 12/03/2015  ? Mixed hyperlipidemia 12/03/2015  ? Pustular psoriasis of palms and soles 10/28/2010  ?  ?Past Medical History:  ?Diagnosis Date  ? Arthritis   ? Hyperlipidemia   ? diet controlled  ? Psoriasis   ? Thyroid disease   ? Wears contact lenses   ?  ?Family History  ?Problem Relation Age of Onset  ? Kidney disease Mother   ? Diabetes Mother   ? Heart attack Father   ? Rheum arthritis Brother   ? Healthy Daughter   ? Healthy Son   ? Heart attack Maternal Grandmother   ? Heart attack Maternal Grandfather   ? Cancer Paternal Grandmother   ? Heart attack Paternal Grandfather   ? Rheum arthritis Brother   ? Colon cancer Neg Hx   ? Colon polyps Neg Hx   ? Esophageal cancer Neg Hx   ? Rectal cancer Neg Hx   ? Stomach cancer Neg Hx   ? ?Past Surgical History:  ?Procedure Laterality Date  ? ABDOMINAL HYSTERECTOMY  2000  ? part  ? ANTERIOR AND POSTERIOR REPAIR N/A 09/08/2016  ? Procedure: ANTERIOR (CYSTOCELE) AND POSTERIOR REPAIR (RECTOCELE)/Perineorrhaphy;  Surgeon: Brien Few, MD;  Location: [facility];  Service: Gynecology;  Laterality: N/A;  Requests 22min. Repair of enterocele/posterior  ? COLONOSCOPY    ? ~12 yrs ago- normal per pt   ? COSMETIC SURGERY  1999  ? breast implants  ?  HAGLAND'S DEFORMITY EXCISION Left 09/13/2013  ? Procedure: LEFT FOOT: EXCISION INTERDIGITAL MORTON'S NEUROMA SINGLE EACH;  Surgeon: Yvette Rack., MD;  Location: [facility];  Service: Orthopedics;  Laterality: Left;  ? KNEE ARTHROSCOPY  2013,2011  ? right/left  ? SHOULDER ARTHROSCOPY    ? rt  ? TUBAL LIGATION    ? WISDOM TOOTH EXTRACTION    ? ?Social History  ? ?Social History Narrative  ? Right handed   ? ?Immunization History  ?Administered Date(s) Administered  ? Influenza,inj,Quad PF,6+ Mos 12/03/2015, 11/22/2017, 11/09/2018,  12/11/2019, 02/02/2021  ? Moderna Sars-Covid-2 Vaccination 08/13/2019, 08/19/2019, 09/16/2019  ? Pneumococcal Polysaccharide-23 11/22/2017  ? Tdap 02/09/2009, 05/29/2019  ? Zoster Recombinat (Shingrix) 11/09/2018, 05/29/2019  ?  ? ?Objective: ?Vital Signs: There were no vitals taken for this visit.  ? ?Physical Exam  ? ?Musculoskeletal Exam: *** ? ?CDAI Exam: ?CDAI Score: -- ?Patient Global: --; Provider Global: -- ?Swollen: --; Tender: -- ?Joint Exam 03/24/2021  ? ?No joint exam has been documented for this visit  ? ?There is currently no information documented on the homunculus. Go to the Rheumatology activity and complete the homunculus joint exam. ? ?Investigation: ?No additional findings. ? ?Imaging: ?No results found. ? ?Recent Labs: ?Lab Results  ?Component Value Date  ? WBC 8.8 01/28/2021  ? HGB 14.0 01/28/2021  ? PLT 415 01/28/2021  ? NA 141 01/28/2021  ? K 4.5 01/28/2021  ? CL 100 01/28/2021  ? CO2 26 01/28/2021  ? GLUCOSE 84 01/28/2021  ? BUN 14 01/28/2021  ? CREATININE 0.80 01/28/2021  ? BILITOT 0.3 01/28/2021  ? ALKPHOS 141 (H) 01/28/2021  ? AST 25 01/28/2021  ? ALT 28 01/28/2021  ? PROT 7.1 01/28/2021  ? ALBUMIN 4.5 01/28/2021  ? CALCIUM 9.9 01/28/2021  ? GFRAA 92 07/09/2020  ? QFTBGOLDPLUS NEGATIVE 07/09/2020  ? ? ?Speciality Comments: [place] Nail stopped 08/01/20 ?Tremfya started 08/03/20 ? ?Procedures:  ?No procedures performed ?Allergies: Patient has no known allergies.  ? ?Assessment / Plan:     ?Visit Diagnoses: No diagnosis found. ? ?Orders: ?No orders of the defined types were placed in this encounter. ? ?No orders of the defined types were placed in this encounter. ? ? ?Face-to-face time spent with patient was *** minutes. Greater than 50% of time was spent in counseling and coordination of care. ? ?Follow-Up Instructions: No follow-ups on file. ? ? ?Earnestine Mealing, CMA ? ?Note - This record has been created using [employer].  ?Chart creation errors have been sought, but may not always  ?have  been located. Such creation errors do not reflect on  ?the standard of medical care.  ?

## 2021-03-22 NOTE — Progress Notes (Unsigned)
Office Visit Note  Patient: Ann Newton             Date of Birth: [birth year]           MRN: [id number]             PCP: Carlean Jews, NP Referring: Mliss Sax,* Visit Date: 04/01/2021 Occupation: @GUAROCC @  Subjective:  No chief complaint on file.   History of Present Illness: Ann Newton is a 66 y.o. female ***   Activities of Daily Living:  Patient reports morning stiffness for *** {minute/hour:19697}.   Patient {ACTIONS;DENIES/REPORTS:21021675::"Denies"} nocturnal pain.  Difficulty dressing/grooming: {ACTIONS;DENIES/REPORTS:21021675::"Denies"} Difficulty climbing stairs: {ACTIONS;DENIES/REPORTS:21021675::"Denies"} Difficulty getting out of chair: {ACTIONS;DENIES/REPORTS:21021675::"Denies"} Difficulty using hands for taps, buttons, cutlery, and/or writing: {ACTIONS;DENIES/REPORTS:21021675::"Denies"}  No Rheumatology ROS completed.   PMFS History:  Patient Active Problem List   Diagnosis Date Noted   Weight loss 02/25/2021   Fever blister 02/04/2021   Edema 02/04/2021   Thyroid nodule greater than or equal to 1 cm in diameter incidentally noted on imaging study 11/20/2018   Fatigue 11/09/2018   Elevated glucose 11/09/2018   Snores 11/09/2018   Hair loss 11/09/2018   Need for influenza vaccination 11/09/2018   Thyromegaly 11/09/2018   Allergic reaction 05/17/2018   Maculopapular rash 05/17/2018   Family history of rheumatoid arthritis 03/09/2018   History of vitamin D deficiency 03/09/2018   Primary osteoarthritis of both knees 02/22/2018   Primary osteoarthritis of both hands 02/22/2018   Neuropathy 11/22/2017   Chronic pain of both knees 11/22/2017   Need for shingles vaccine 11/22/2017   Vitamin D deficiency 01/24/2016   Smoking hx- ( less 1 ppd * 15 yrs) - Quit Jan 17 2013 12/28/2015   Elevated LDL cholesterol level 12/28/2015   Elevated vitamin B12 level 12/28/2015   h/o Mild peripheral edema- takes HCTZ for this 12/20/2015    Dietary B12 deficiency 12/20/2015   History of chickenpox ( Aug '17 )  12/20/2015   Healthcare maintenance 12/20/2015   Body mass index 29.0-29.9, adult 12/03/2015   h/o Hypertriglyceridemia 12/03/2015   Mixed hyperlipidemia 12/03/2015   Pustular psoriasis of palms and soles 10/28/2010    Past Medical History:  Diagnosis Date   Arthritis    Hyperlipidemia    diet controlled   Psoriasis    Thyroid disease    Wears contact lenses     Family History  Problem Relation Age of Onset   Kidney disease Mother    Diabetes Mother    Heart attack Father    Rheum arthritis Brother    Healthy Daughter    Healthy Son    Heart attack Maternal Grandmother    Heart attack Maternal Grandfather    Cancer Paternal Grandmother    Heart attack Paternal Grandfather    Rheum arthritis Brother    Colon cancer Neg Hx    Colon polyps Neg Hx    Esophageal cancer Neg Hx    Rectal cancer Neg Hx    Stomach cancer Neg Hx    Past Surgical History:  Procedure Laterality Date   ABDOMINAL HYSTERECTOMY  2000   part   ANTERIOR AND POSTERIOR REPAIR N/A 09/08/2016   Procedure: ANTERIOR (CYSTOCELE) AND POSTERIOR REPAIR (RECTOCELE)/Perineorrhaphy;  Surgeon: 09/10/2016, MD;  Location: [facility];  Service: Gynecology;  Laterality: N/A;  Requests Olivia Mackie. Repair of enterocele/posterior   COLONOSCOPY     ~12 yrs ago- normal per pt    COSMETIC SURGERY  1999   breast implants  HAGLAND'S DEFORMITY EXCISION Left 09/13/2013   Procedure: LEFT FOOT: EXCISION INTERDIGITAL MORTON'S NEUROMA SINGLE EACH;  Surgeon: Thera Flake., MD;  Location: [facility];  Service: Orthopedics;  Laterality: Left;   KNEE ARTHROSCOPY  2013,2011   right/left   SHOULDER ARTHROSCOPY     rt   TUBAL LIGATION     WISDOM TOOTH EXTRACTION     Social History   Social History Narrative   Right handed    Immunization History  Administered Date(s) Administered   Influenza,inj,Quad PF,6+ Mos 12/03/2015, 11/22/2017, 11/09/2018,  12/11/2019, 02/02/2021   Moderna Sars-Covid-2 Vaccination 08/13/2019, 08/19/2019, 09/16/2019   Pneumococcal Polysaccharide-23 11/22/2017   Tdap 02/09/2009, 05/29/2019   Zoster Recombinat (Shingrix) 11/09/2018, 05/29/2019     Objective: Vital Signs: There were no vitals taken for this visit.   Physical Exam   Musculoskeletal Exam: ***  CDAI Exam: CDAI Score: -- Patient Global: --; Provider Global: -- Swollen: --; Tender: -- Joint Exam 04/01/2021   No joint exam has been documented for this visit   There is currently no information documented on the homunculus. Go to the Rheumatology activity and complete the homunculus joint exam.  Investigation: No additional findings.  Imaging: No results found.  Recent Labs: Lab Results  Component Value Date   WBC 8.8 01/28/2021   HGB 14.0 01/28/2021   PLT 415 01/28/2021   NA 141 01/28/2021   K 4.5 01/28/2021   CL 100 01/28/2021   CO2 26 01/28/2021   GLUCOSE 84 01/28/2021   BUN 14 01/28/2021   CREATININE 0.80 01/28/2021   BILITOT 0.3 01/28/2021   ALKPHOS 141 (H) 01/28/2021   AST 25 01/28/2021   ALT 28 01/28/2021   PROT 7.1 01/28/2021   ALBUMIN 4.5 01/28/2021   CALCIUM 9.9 01/28/2021   GFRAA 92 07/09/2020   QFTBGOLDPLUS NEGATIVE 07/09/2020    Speciality Comments: Otezla stopped 08/01/20 Tremfya started 08/03/20  Procedures:  No procedures performed Allergies: Patient has no known allergies.   Assessment / Plan:     Visit Diagnoses: No diagnosis found.  Orders: No orders of the defined types were placed in this encounter.  No orders of the defined types were placed in this encounter.   Face-to-face time spent with patient was *** minutes. Greater than 50% of time was spent in counseling and coordination of care.  Follow-Up Instructions: No follow-ups on file.   Ellen Henri, CMA  Note - This record has been created using Animal nutritionist.  Chart creation errors have been sought, but may not always  have  been located. Such creation errors do not reflect on  the standard of medical care.

## 2021-03-24 ENCOUNTER — Ambulatory Visit: Payer: 59 | Admitting: Rheumatology

## 2021-03-24 DIAGNOSIS — M19041 Primary osteoarthritis, right hand: Secondary | ICD-10-CM

## 2021-03-24 DIAGNOSIS — Z8261 Family history of arthritis: Secondary | ICD-10-CM

## 2021-03-24 DIAGNOSIS — Z79899 Other long term (current) drug therapy: Secondary | ICD-10-CM

## 2021-03-24 DIAGNOSIS — G629 Polyneuropathy, unspecified: Secondary | ICD-10-CM

## 2021-03-24 DIAGNOSIS — M170 Bilateral primary osteoarthritis of knee: Secondary | ICD-10-CM

## 2021-03-24 DIAGNOSIS — E781 Pure hyperglyceridemia: Secondary | ICD-10-CM

## 2021-03-24 DIAGNOSIS — Z87891 Personal history of nicotine dependence: Secondary | ICD-10-CM

## 2021-03-24 DIAGNOSIS — Z8639 Personal history of other endocrine, nutritional and metabolic disease: Secondary | ICD-10-CM

## 2021-03-24 DIAGNOSIS — M25462 Effusion, left knee: Secondary | ICD-10-CM

## 2021-03-24 DIAGNOSIS — L4050 Arthropathic psoriasis, unspecified: Secondary | ICD-10-CM

## 2021-03-24 DIAGNOSIS — R202 Paresthesia of skin: Secondary | ICD-10-CM

## 2021-03-24 DIAGNOSIS — M160 Bilateral primary osteoarthritis of hip: Secondary | ICD-10-CM

## 2021-03-24 DIAGNOSIS — L403 Pustulosis palmaris et plantaris: Secondary | ICD-10-CM

## 2021-04-01 ENCOUNTER — Ambulatory Visit (INDEPENDENT_AMBULATORY_CARE_PROVIDER_SITE_OTHER): Payer: 59 | Admitting: Physician Assistant

## 2021-04-01 ENCOUNTER — Other Ambulatory Visit: Payer: Self-pay

## 2021-04-01 ENCOUNTER — Telehealth: Payer: Self-pay | Admitting: Rheumatology

## 2021-04-01 ENCOUNTER — Encounter: Payer: Self-pay | Admitting: Physician Assistant

## 2021-04-01 VITALS — BP 131/77 | HR 111 | Resp 12 | Ht 68.0 in | Wt 186.4 lb

## 2021-04-01 DIAGNOSIS — M19042 Primary osteoarthritis, left hand: Secondary | ICD-10-CM

## 2021-04-01 DIAGNOSIS — L4050 Arthropathic psoriasis, unspecified: Secondary | ICD-10-CM

## 2021-04-01 DIAGNOSIS — Z79899 Other long term (current) drug therapy: Secondary | ICD-10-CM

## 2021-04-01 DIAGNOSIS — M160 Bilateral primary osteoarthritis of hip: Secondary | ICD-10-CM

## 2021-04-01 DIAGNOSIS — Z111 Encounter for screening for respiratory tuberculosis: Secondary | ICD-10-CM

## 2021-04-01 DIAGNOSIS — M19041 Primary osteoarthritis, right hand: Secondary | ICD-10-CM | POA: Diagnosis not present

## 2021-04-01 DIAGNOSIS — Z87891 Personal history of nicotine dependence: Secondary | ICD-10-CM

## 2021-04-01 DIAGNOSIS — L403 Pustulosis palmaris et plantaris: Secondary | ICD-10-CM

## 2021-04-01 DIAGNOSIS — E781 Pure hyperglyceridemia: Secondary | ICD-10-CM

## 2021-04-01 DIAGNOSIS — M25462 Effusion, left knee: Secondary | ICD-10-CM

## 2021-04-01 DIAGNOSIS — Z8261 Family history of arthritis: Secondary | ICD-10-CM

## 2021-04-01 DIAGNOSIS — Z8639 Personal history of other endocrine, nutritional and metabolic disease: Secondary | ICD-10-CM

## 2021-04-01 DIAGNOSIS — M170 Bilateral primary osteoarthritis of knee: Secondary | ICD-10-CM

## 2021-04-01 DIAGNOSIS — R202 Paresthesia of skin: Secondary | ICD-10-CM

## 2021-04-01 DIAGNOSIS — G629 Polyneuropathy, unspecified: Secondary | ICD-10-CM

## 2021-04-01 MED ORDER — CALCIPOTRIENE 0.005 % EX OINT: TOPICAL_OINTMENT | CUTANEOUS | 2 refills | Status: DC

## 2021-04-01 MED ORDER — CLOBETASOL PROPIONATE 0.05 % EX OINT: TOPICAL_OINTMENT | Freq: Two times a day (BID) | CUTANEOUS | 2 refills | Status: DC

## 2021-04-01 NOTE — Patient Instructions (Signed)
Standing Labs ?We placed an order today for your standing lab work.  ? ?Please have your standing labs drawn in June and every 3 months  ? ?If possible, please have your labs drawn 2 weeks prior to your appointment so that the provider can discuss your results at your appointment. ? ?Please note that you may see your imaging and lab results in MyChart before we have reviewed them. ?We may be awaiting multiple results to interpret others before contacting you. ?Please allow our office up to 72 hours to thoroughly review all of the results before contacting the office for clarification of your results. ? ?We have open lab daily: ?Monday through Thursday from 1:30-4:30 PM and Friday from 1:30-4:00 PM ?at the office of Dr. Shaili Deveshwar, [facility].   ?Please be advised, all patients with office appointments requiring lab work will take precedent over walk-in lab work.  ?If possible, please come for your lab work on Monday and Friday afternoons, as you may experience shorter wait times. ?The office is located at [address], [address], [place], [place] [address] ?No appointment is necessary.   ?Labs are drawn by [facility]. Please bring your co-pay at the time of your lab draw.  You may receive a bill from [facility] for your lab work. ? ?Please note if you are on Hydroxychloroquine and and an order has been placed for a Hydroxychloroquine level, you will need to have it drawn 4 hours or more after your last dose. ? ?If you wish to have your labs drawn at another location, please call the office 24 hours in advance to send orders. ? ?If you have any questions regarding directions or hours of operation,  ?please call [phone].   ?As a reminder, please drink plenty of water prior to coming for your lab work. Thanks! ? ?

## 2021-04-01 NOTE — Telephone Encounter (Signed)
[facility] called the office stating that the prescription sent in today for Calcipotriene ointment 0.005% for the patient is missing the day supply. They stated a new e-script can be sent in with the day supply or call in to [phone]. They state if a response is not received then per her insurance they will have to dispense the smallest supply for 30 days.  ?

## 2021-04-01 NOTE — Telephone Encounter (Signed)
Apply thin film to affected skin once daily.

## 2021-04-01 NOTE — Telephone Encounter (Signed)
2 weeks is fine

## 2021-04-01 NOTE — Telephone Encounter (Signed)
Resent prescription to include the days supply.  ?

## 2021-04-02 LAB — COMPLETE METABOLIC PANEL WITH GFR
AG Ratio: 1.7 (calc) (ref 1.0–2.5)
ALT: 19 U/L (ref 6–29)
AST: 19 U/L (ref 10–35)
Albumin: 4.4 g/dL (ref 3.6–5.1)
Alkaline phosphatase (APISO): 107 U/L (ref 37–153)
BUN: 17 mg/dL (ref 7–25)
CO2: 28 mmol/L (ref 20–32)
Calcium: 10.1 mg/dL (ref 8.6–10.4)
Chloride: 102 mmol/L (ref 98–110)
Creat: 0.87 mg/dL (ref 0.50–1.05)
Globulin: 2.6 g/dL (calc) (ref 1.9–3.7)
Glucose, Bld: 96 mg/dL (ref 65–99)
Potassium: 4.7 mmol/L (ref 3.5–5.3)
Sodium: 140 mmol/L (ref 135–146)
Total Bilirubin: 0.4 mg/dL (ref 0.2–1.2)
Total Protein: 7 g/dL (ref 6.1–8.1)
eGFR: 74 mL/min/{1.73_m2} (ref >=60)

## 2021-04-02 LAB — CBC WITH DIFFERENTIAL/PLATELET
Absolute Monocytes: 595 cells/uL (ref 200–950)
Basophils Absolute: 43 cells/uL (ref 0–200)
Basophils Relative: 0.5 %
Eosinophils Absolute: 119 cells/uL (ref 15–500)
Eosinophils Relative: 1.4 %
HCT: 42.9 % (ref 35.0–45.0)
Hemoglobin: 14.2 g/dL (ref 11.7–15.5)
Lymphs Abs: 3341 cells/uL (ref 850–3900)
MCH: 29.5 pg (ref 27.0–33.0)
MCHC: 33.1 g/dL (ref 32.0–36.0)
MCV: 89.2 fL (ref 80.0–100.0)
MPV: 10.7 fL (ref 7.5–12.5)
Monocytes Relative: 7 %
Neutro Abs: 4403 cells/uL (ref 1500–7800)
Neutrophils Relative %: 51.8 %
Platelets: 398 10*3/uL (ref 140–400)
RBC: 4.81 10*6/uL (ref 3.80–5.10)
RDW: 12.5 % (ref 11.0–15.0)
Total Lymphocyte: 39.3 %
WBC: 8.5 10*3/uL (ref 3.8–10.8)

## 2021-04-02 NOTE — Progress Notes (Signed)
CBC and CMP WNL

## 2021-04-06 ENCOUNTER — Encounter: Payer: Self-pay | Admitting: Nurse Practitioner

## 2021-04-07 ENCOUNTER — Encounter: Payer: Self-pay | Admitting: Nurse Practitioner

## 2021-04-07 ENCOUNTER — Telehealth (INDEPENDENT_AMBULATORY_CARE_PROVIDER_SITE_OTHER): Payer: 59 | Admitting: Nurse Practitioner

## 2021-04-07 ENCOUNTER — Other Ambulatory Visit: Payer: Self-pay

## 2021-04-07 VITALS — Ht 68.0 in | Wt 186.0 lb

## 2021-04-07 DIAGNOSIS — J0140 Acute pansinusitis, unspecified: Secondary | ICD-10-CM

## 2021-04-07 MED ORDER — AZITHROMYCIN 250 MG PO TABS: ORAL_TABLET | ORAL | 0 refills | Status: DC

## 2021-04-07 NOTE — Progress Notes (Signed)
Virtual Visit via Telephone Note ? ?I connected with COLIN PUCK on 04/07/21 at  9:30 AM EDT by telephone and verified that I am speaking with the correct person using two identifiers. ? ?Location: ?Patient: home ?Provider: [place] [facility] at [facility]   ?  ?I discussed the limitations, risks, security and privacy concerns of performing an evaluation and management service by telephone and the availability of in person appointments. I also discussed with the patient that there may be a patient responsible charge related to this service. The patient expressed understanding and agreed to proceed. ? ? ?History of Present Illness: ?The patient presents for acute visit. States that she has congestion, sinus pain and pressure, post nasal drip, and headache. She denies fever, nausea, or vomiting. Her appetite is diminished. She states that symptoms started suddenly yesterday. She did take a home test for COVID 19 with negative results. She has been taking OTC severe allergy multi symptom and using afrin nasal spray. She states these help for a short period of time and then symptoms come right back. She states that her husband has had same symptoms for several days. Was feeling better yesterday, but today, feels much worse.  ? ?Observations/Objective: ? ?The patient is alert and oriented. She is pleasant and answers all questions appropriately. Breathing is non-labored. She is in no acute distress at this time.  She has moderate nasal congestion. ? ?Today's Vitals  ? 04/07/21 0925  ?Weight: 186 lb (84.4 kg)  ?Height: 5\' 8"  (1.727 m)  ? ?Body mass index is 28.28 kg/m?.  ? ?Assessment and Plan: ?1. Acute non-recurrent pansinusitis ?Start z-pack. Take as directed for 5 days. Rest and increase fluids. Continue using OTC medication to control symptoms.   ?- azithromycin (ZITHROMAX) 250 MG tablet; z-pack - take as directed for 5 days  Dispense: 6 tablet; Refill: 0  ? ?Follow Up Instructions: ? ?  ?I discussed the  assessment and treatment plan with the patient. The patient was provided an opportunity to ask questions and all were answered. The patient agreed with the plan and demonstrated an understanding of the instructions. ?  ?The patient was advised to call back or seek an in-person evaluation if the symptoms worsen or if the condition fails to improve as anticipated. ? ?I provided 10 minutes of non-face-to-face time during this encounter. ? ? ?Ronnell Freshwater, NP  ?

## 2021-04-26 ENCOUNTER — Encounter: Payer: Self-pay | Admitting: Nurse Practitioner

## 2021-04-26 ENCOUNTER — Ambulatory Visit (INDEPENDENT_AMBULATORY_CARE_PROVIDER_SITE_OTHER): Payer: 59 | Admitting: Nurse Practitioner

## 2021-04-26 VITALS — BP 98/62 | HR 86 | Temp 97.9°F | Ht 68.11 in | Wt 184.9 lb

## 2021-04-26 DIAGNOSIS — Z6828 Body mass index (BMI) 28.0-28.9, adult: Secondary | ICD-10-CM | POA: Diagnosis not present

## 2021-04-26 DIAGNOSIS — R030 Elevated blood-pressure reading, without diagnosis of hypertension: Secondary | ICD-10-CM

## 2021-04-26 MED ORDER — PHENTERMINE HCL 37.5 MG PO TABS: 37.5000 mg | ORAL_TABLET | Freq: Every day | ORAL | 2 refills | Status: DC

## 2021-04-26 NOTE — Progress Notes (Signed)
Established patient visit ? ? ?Patient: Ann Newton   DOB: [birth year]   66 y.o. Female  MRN: [id number] ?Visit Date: 04/26/2021 ? ? ?Chief Complaint  ?Patient presents with  ? Weight Check  ? ?Subjective  ?  ?HPI  ?The patient is here for follow up of weight management.  ?-currently on phentermine.  ?-started 01/2021 - starting weight was 196  ?-most recent weight during visit for weigh management 02/21/2021 and was 190 ?-today's weight is 185 -  ?-total weight loss - 11 pounds . ?-no negative side effects related to taking this medication.  ? ? ?Medications: ?Outpatient Medications Prior to Visit  ?Medication Sig  ? acyclovir (ZOVIRAX) 400 MG tablet Take 1 tablet po TID for 5 days. Begin taking at first sign of fever blister  ? azithromycin (ZITHROMAX) 250 MG tablet z-pack - take as directed for 5 days  ? calcipotriene (DOVONOX) 0.005 % ointment APPLY TO AFFECTED AREA ON THE SKIN DAILY FOR 2 WEEKS.  ? clobetasol ointment (TEMOVATE) 0.05 % Apply topically 2 (two) times daily.  ? diclofenac sodium (VOLTAREN) 1 % GEL 3 grams to 3 large joints up to 3 times daily  ? hydrochlorothiazide (HYDRODIURIL) 12.5 MG tablet Take 1 tablet (12.5 mg total) by mouth daily.  ? meloxicam (MOBIC) 7.5 MG tablet Take 7.5 mg by mouth as needed.  ? Multiple Vitamins-Minerals (CENTRUM SILVER 50+WOMEN) TABS Take 1 tablet by mouth daily.  ? TREMFYA 100 MG/ML SOPN INJECT 100MG  SUBCUTANEOUSLY EVERY 8 WEEKS  ? [DISCONTINUED] phentermine (ADIPEX-P) 37.5 MG tablet Take 1 tablet (37.5 mg total) by mouth daily before breakfast.  ? EFUDEX 5 % cream Apply topically. (Patient not taking: Reported on 04/01/2021)  ? ?No facility-administered medications prior to visit.  ? ? ?Review of Systems  ?Constitutional:  Negative for activity change, appetite change, chills, fatigue and fever.  ?     5 pound weight loss since most recent visit.  ?HENT:  Negative for congestion, postnasal drip, rhinorrhea, sinus pressure, sinus pain, sneezing and sore throat.    ?Eyes: Negative.   ?Respiratory:  Negative for cough, chest tightness, shortness of breath and wheezing.   ?Cardiovascular:  Negative for chest pain and palpitations.  ?     Blood pressure stable.  ?Gastrointestinal:  Negative for abdominal pain, constipation, diarrhea, nausea and vomiting.  ?Endocrine: Negative for cold intolerance, heat intolerance, polydipsia and polyuria.  ?Genitourinary:  Negative for dyspareunia, dysuria, flank pain, frequency and urgency.  ?Musculoskeletal:  Negative for arthralgias, back pain and myalgias.  ?Skin:  Negative for rash.  ?Allergic/Immunologic: Negative for environmental allergies.  ?Neurological:  Negative for dizziness, weakness and headaches.  ?Hematological:  Negative for adenopathy.  ?Psychiatric/Behavioral:  The patient is not nervous/anxious.   ? ? ? ? Objective  ?  ? ?Today's Vitals  ? 04/26/21 0919  ?BP: 98/62  ?Pulse: 86  ?Temp: 97.9 ?F (36.6 ?C)  ?SpO2: 98%  ?Weight: 184 lb 14.4 oz (83.9 kg)  ?Height: 5' 8.11" (1.73 m)  ? ?Body mass index is 28.02 kg/m?.  ? ?BP Readings from Last 3 Encounters:  ?04/26/21 98/62  ?04/01/21 131/77  ?02/25/21 104/62  ?  ?Wt Readings from Last 3 Encounters:  ?04/26/21 184 lb 14.4 oz (83.9 kg)  ?04/07/21 186 lb (84.4 kg)  ?04/01/21 186 lb 6.4 oz (84.6 kg)  ?  ?Physical Exam ?Vitals and nursing note reviewed.  ?Constitutional:   ?   Appearance: Normal appearance. She is well-developed.  ?HENT:  ?   Head: Normocephalic and  atraumatic.  ?Eyes:  ?   Pupils: Pupils are equal, round, and reactive to light.  ?Cardiovascular:  ?   Rate and Rhythm: Normal rate and regular rhythm.  ?   Pulses: Normal pulses.  ?   Heart sounds: Normal heart sounds.  ?Pulmonary:  ?   Effort: Pulmonary effort is normal.  ?   Breath sounds: Normal breath sounds.  ?Abdominal:  ?   Palpations: Abdomen is soft.  ?Musculoskeletal:     ?   General: Normal range of motion.  ?   Cervical back: Normal range of motion and neck supple.  ?Lymphadenopathy:  ?   Cervical: No  cervical adenopathy.  ?Skin: ?   General: Skin is warm and dry.  ?   Capillary Refill: Capillary refill takes less than 2 seconds.  ?Neurological:  ?   General: No focal deficit present.  ?   Mental Status: She is alert and oriented to person, place, and time.  ?Psychiatric:     ?   Mood and Affect: Mood normal.     ?   Behavior: Behavior normal.     ?   Thought Content: Thought content normal.     ?   Judgment: Judgment normal.  ?  ? ? Assessment & Plan  ?  ?1. Elevated blood pressure reading ?Blood pressure stable.  Continue hydrochlorothiazide 12.5 mg daily. ? ?2. Body mass index 28.0-28.9, adult ?Improved with 5 pound weight loss since her most recent visit.  Continue phentermine 37.5 mg tablets daily. Discussed lowering calorie intake to 1500 calories per day and incorporating exercise into daily routine to help lose weight.  ?- phentermine (ADIPEX-P) 37.5 MG tablet; Take 1 tablet (37.5 mg total) by mouth daily before breakfast.  Dispense: 30 tablet; Refill: 2  ? ?Problem List Items Addressed This Visit   ? ?  ? Other  ? Body mass index 28.0-28.9, adult  ? Relevant Medications  ? phentermine (ADIPEX-P) 37.5 MG tablet  ? Elevated blood pressure reading - Primary  ?  ? ?Return in about 3 months (around 07/26/2021) for routine - weight management.  ?   ? ? ? ? ?Carlean Jews, NP  ?[facility] ?[phone] (phone) ?[phone] (fax) ? ?[facility]  ?

## 2021-04-28 ENCOUNTER — Telehealth: Payer: Self-pay | Admitting: *Deleted

## 2021-04-28 NOTE — Telephone Encounter (Signed)
Received fax from [facility] stating they have been trying to reach patient to refill Tremfya. They have been unsuccessful in reaching patient. Call back number is [phone]. Patient states she has spoken with the pharmacy and has already received her medication.  ?

## 2021-05-02 DIAGNOSIS — R030 Elevated blood-pressure reading, without diagnosis of hypertension: Secondary | ICD-10-CM | POA: Insufficient documentation

## 2021-06-01 ENCOUNTER — Other Ambulatory Visit: Payer: Self-pay | Admitting: Physician Assistant

## 2021-06-01 DIAGNOSIS — L403 Pustulosis palmaris et plantaris: Secondary | ICD-10-CM

## 2021-06-01 DIAGNOSIS — L4050 Arthropathic psoriasis, unspecified: Secondary | ICD-10-CM

## 2021-06-01 NOTE — Telephone Encounter (Signed)
Next Visit: 09/08/2021 ? ?Last Visit: 04/01/2021 ? ?Last Fill: 02/04/2021 ? ?[id number] arthropathy ? ?Current Dose per office note 04/01/2021: Tremfya 100 mg sq injections every 8 weeks-initiated on 08/03/20. ? ?Labs: 04/01/2021 CBC and CMP WNL ? ?TB Gold: 07/09/2020 Neg   ? ?Okay to refill Tremfya?  ?

## 2021-07-25 NOTE — Progress Notes (Unsigned)
Established patient visit   Patient: Ann Newton   DOB: [birth year]   66 y.o. Female  MRN: [id number] Visit Date: 07/26/2021   No chief complaint on file.  Subjective    HPI  Follow up -blood pressure check -weight management. Had been on phentermine int he past.  -last weight 05/05/2021 - 184 -today's weight  07/26/2021 -  -weight change since last visit -    Medications: Outpatient Medications Prior to Visit  Medication Sig   acyclovir (ZOVIRAX) 400 MG tablet Take 1 tablet po TID for 5 days. Begin taking at first sign of fever blister   azithromycin (ZITHROMAX) 250 MG tablet z-pack - take as directed for 5 days   calcipotriene (DOVONOX) 0.005 % ointment APPLY TO AFFECTED AREA ON THE SKIN DAILY FOR 2 WEEKS.   clobetasol ointment (TEMOVATE) 0.05 % Apply topically 2 (two) times daily.   diclofenac sodium (VOLTAREN) 1 % GEL 3 grams to 3 large joints up to 3 times daily   EFUDEX 5 % cream Apply topically. (Patient not taking: Reported on 04/01/2021)   hydrochlorothiazide (HYDRODIURIL) 12.5 MG tablet Take 1 tablet (12.5 mg total) by mouth daily.   meloxicam (MOBIC) 7.5 MG tablet Take 7.5 mg by mouth as needed.   Multiple Vitamins-Minerals (CENTRUM SILVER 50+WOMEN) TABS Take 1 tablet by mouth daily.   phentermine (ADIPEX-P) 37.5 MG tablet Take 1 tablet (37.5 mg total) by mouth daily before breakfast.   TREMFYA 100 MG/ML SOPN INJECT 100MG  SUBCUTANEOUSLY  EVERY 8 WEEKS   No facility-administered medications prior to visit.    Review of Systems  {Labs (Optional):23779}   Objective    There were no vitals taken for this visit. BP Readings from Last 3 Encounters:  04/26/21 98/62  04/01/21 131/77  02/25/21 104/62    Wt Readings from Last 3 Encounters:  04/26/21 184 lb 14.4 oz (83.9 kg)  04/07/21 186 lb (84.4 kg)  04/01/21 186 lb 6.4 oz (84.6 kg)    Physical Exam  ***  No results found for any visits on 07/26/21.  Assessment & Plan     Problem List Items Addressed  This Visit   None    No follow-ups on file.         09/26/21, NP  [facility] [phone] (phone) [phone] (fax)  [facility]

## 2021-07-26 ENCOUNTER — Encounter: Payer: Self-pay | Admitting: Nurse Practitioner

## 2021-07-26 ENCOUNTER — Ambulatory Visit (INDEPENDENT_AMBULATORY_CARE_PROVIDER_SITE_OTHER): Payer: 59 | Admitting: Nurse Practitioner

## 2021-07-26 VITALS — BP 110/66 | HR 91 | Temp 97.7°F | Ht 68.0 in | Wt 182.0 lb

## 2021-07-26 DIAGNOSIS — R030 Elevated blood-pressure reading, without diagnosis of hypertension: Secondary | ICD-10-CM | POA: Diagnosis not present

## 2021-07-26 DIAGNOSIS — Z6827 Body mass index (BMI) 27.0-27.9, adult: Secondary | ICD-10-CM | POA: Diagnosis not present

## 2021-07-26 DIAGNOSIS — L209 Atopic dermatitis, unspecified: Secondary | ICD-10-CM | POA: Diagnosis not present

## 2021-07-26 MED ORDER — CLOBETASOL PROPIONATE 0.05 % EX OINT
TOPICAL_OINTMENT | Freq: Two times a day (BID) | CUTANEOUS | 2 refills | Status: DC
Start: 2021-07-26 — End: 2021-12-01

## 2021-07-26 MED ORDER — PHENTERMINE HCL 37.5 MG PO TABS: 37.5000 mg | ORAL_TABLET | Freq: Every day | ORAL | 2 refills | Status: DC

## 2021-07-26 NOTE — Patient Instructions (Signed)
Calorie Counting for Weight Loss Calories are units of energy. Your body needs a certain number of calories from food to keep going throughout the day. When you eat or drink more calories than your body needs, your body stores the extra calories mostly as fat. When you eat or drink fewer calories than your body needs, your body burns fat to get the energy it needs. Calorie counting means keeping track of how many calories you eat and drink each day. Calorie counting can be helpful if you need to lose weight. If you eat fewer calories than your body needs, you should lose weight. Ask your health care provider what a healthy weight is for you. For calorie counting to work, you will need to eat the right number of calories each day to lose a healthy amount of weight per week. A dietitian can help you figure out how many calories you need in a day and will suggest ways to reach your calorie goal. A healthy amount of weight to lose each week is usually 1-2 lb (0.5-0.9 kg). This usually means that your daily calorie intake should be reduced by 500-750 calories. Eating 1,200-1,500 calories a day can help most women lose weight. Eating 1,500-1,800 calories a day can help most men lose weight. What do I need to know about calorie counting? Work with your health care provider or dietitian to determine how many calories you should get each day. To meet your daily calorie goal, you will need to: Find out how many calories are in each food that you would like to eat. Try to do this before you eat. Decide how much of the food you plan to eat. Keep a food log. Do this by writing down what you ate and how many calories it had. To successfully lose weight, it is important to balance calorie counting with a healthy lifestyle that includes regular activity. Where do I find calorie information?  The number of calories in a food can be found on a Nutrition Facts label. If a food does not have a Nutrition Facts label, try  to look up the calories online or ask your dietitian for help. Remember that calories are listed per serving. If you choose to have more than one serving of a food, you will have to multiply the calories per serving by the number of servings you plan to eat. For example, the label on a package of bread might say that a serving size is 1 slice and that there are 90 calories in a serving. If you eat 1 slice, you will have eaten 90 calories. If you eat 2 slices, you will have eaten 180 calories. How do I keep a food log? After each time that you eat, record the following in your food log as soon as possible: What you ate. Be sure to include toppings, sauces, and other extras on the food. How much you ate. This can be measured in cups, ounces, or number of items. How many calories were in each food and drink. The total number of calories in the food you ate. Keep your food log near you, such as in a pocket-sized notebook or on an app or website on your mobile phone. Some programs will calculate calories for you and show you how many calories you have left to meet your daily goal. What are some portion-control tips? Know how many calories are in a serving. This will help you know how many servings you can have of a certain   food. Use a measuring cup to measure serving sizes. You could also try weighing out portions on a kitchen scale. With time, you will be able to estimate serving sizes for some foods. Take time to put servings of different foods on your favorite plates or in your favorite bowls and cups so you know what a serving looks like. Try not to eat straight from a food's packaging, such as from a bag or box. Eating straight from the package makes it hard to see how much you are eating and can lead to overeating. Put the amount you would like to eat in a cup or on a plate to make sure you are eating the right portion. Use smaller plates, glasses, and bowls for smaller portions and to prevent  overeating. Try not to multitask. For example, avoid watching TV or using your computer while eating. If it is time to eat, sit down at a table and enjoy your food. This will help you recognize when you are full. It will also help you be more mindful of what and how much you are eating. What are tips for following this plan? Reading food labels Check the calorie count compared with the serving size. The serving size may be smaller than what you are used to eating. Check the source of the calories. Try to choose foods that are high in protein, fiber, and vitamins, and low in saturated fat, trans fat, and sodium. Shopping Read nutrition labels while you shop. This will help you make healthy decisions about which foods to buy. Pay attention to nutrition labels for low-fat or fat-free foods. These foods sometimes have the same number of calories or more calories than the full-fat versions. They also often have added sugar, starch, or salt to make up for flavor that was removed with the fat. Make a grocery list of lower-calorie foods and stick to it. Cooking Try to cook your favorite foods in a healthier way. For example, try baking instead of frying. Use low-fat dairy products. Meal planning Use more fruits and vegetables. One-half of your plate should be fruits and vegetables. Include lean proteins, such as chicken, turkey, and fish. Lifestyle Each week, aim to do one of the following: 150 minutes of moderate exercise, such as walking. 75 minutes of vigorous exercise, such as running. General information Know how many calories are in the foods you eat most often. This will help you calculate calorie counts faster. Find a way of tracking calories that works for you. Get creative. Try different apps or programs if writing down calories does not work for you. What foods should I eat?  Eat nutritious foods. It is better to have a nutritious, high-calorie food, such as an avocado, than a food with  few nutrients, such as a bag of potato chips. Use your calories on foods and drinks that will fill you up and will not leave you hungry soon after eating. Examples of foods that fill you up are nuts and nut butters, vegetables, lean proteins, and high-fiber foods such as whole grains. High-fiber foods are foods with more than 5 g of fiber per serving. Pay attention to calories in drinks. Low-calorie drinks include water and unsweetened drinks. The items listed above may not be a complete list of foods and beverages you can eat. Contact a dietitian for more information. What foods should I limit? Limit foods or drinks that are not good sources of vitamins, minerals, or protein or that are high in unhealthy fats. These   include: Candy. Other sweets. Sodas, specialty coffee drinks, alcohol, and juice. The items listed above may not be a complete list of foods and beverages you should avoid. Contact a dietitian for more information. How do I count calories when eating out? Pay attention to portions. Often, portions are much larger when eating out. Try these tips to keep portions smaller: Consider sharing a meal instead of getting your own. If you get your own meal, eat only half of it. Before you start eating, ask for a container and put half of your meal into it. When available, consider ordering smaller portions from the menu instead of full portions. Pay attention to your food and drink choices. Knowing the way food is cooked and what is included with the meal can help you eat fewer calories. If calories are listed on the menu, choose the lower-calorie options. Choose dishes that include vegetables, fruits, whole grains, low-fat dairy products, and lean proteins. Choose items that are boiled, broiled, grilled, or steamed. Avoid items that are buttered, battered, fried, or served with cream sauce. Items labeled as crispy are usually fried, unless stated otherwise. Choose water, low-fat milk,  unsweetened iced tea, or other drinks without added sugar. If you want an alcoholic beverage, choose a lower-calorie option, such as a glass of wine or light beer. Ask for dressings, sauces, and syrups on the side. These are usually high in calories, so you should limit the amount you eat. If you want a salad, choose a garden salad and ask for grilled meats. Avoid extra toppings such as bacon, cheese, or fried items. Ask for the dressing on the side, or ask for olive oil and vinegar or lemon to use as dressing. Estimate how many servings of a food you are given. Knowing serving sizes will help you be aware of how much food you are eating at restaurants. Where to find more information Centers for Disease Control and Prevention: www.cdc.gov [place] Department of Agriculture: myplate.gov Summary Calorie counting means keeping track of how many calories you eat and drink each day. If you eat fewer calories than your body needs, you should lose weight. A healthy amount of weight to lose per week is usually 1-2 lb (0.5-0.9 kg). This usually means reducing your daily calorie intake by 500-750 calories. The number of calories in a food can be found on a Nutrition Facts label. If a food does not have a Nutrition Facts label, try to look up the calories online or ask your dietitian for help. Use smaller plates, glasses, and bowls for smaller portions and to prevent overeating. Use your calories on foods and drinks that will fill you up and not leave you hungry shortly after a meal. This information is not intended to replace advice given to you by your health care provider. Make sure you discuss any questions you have with your health care provider. Document Revised: 02/14/2019 Document Reviewed: 02/14/2019 Elsevier Patient Education  2023 Elsevier Inc.  

## 2021-08-02 ENCOUNTER — Telehealth: Payer: Self-pay | Admitting: Pharmacist

## 2021-08-02 NOTE — Telephone Encounter (Signed)
Submitted a Prior Authorization RENEWAL request to [facility] for [employer] via CoverMyMeds. Will update once we receive a response.  Key: [id number]  Chesley Mires, PharmD, MPH, BCPS, CPP Clinical Pharmacist (Rheumatology and Pulmonology)

## 2021-08-03 NOTE — Telephone Encounter (Signed)
Received notification from [facility] regarding a prior authorization for [facility]. Authorization has been APPROVED from 08/02/21 to 08/03/22.   Patient must continue to fill through [facility]: [phone]   Authorization #  [id number]  Chesley Mires, PharmD, MPH, BCPS, CPP Clinical Pharmacist (Rheumatology and Pulmonology)

## 2021-08-26 NOTE — Progress Notes (Signed)
Office Visit Note  Patient: Ann Newton             Date of Birth: [birth year]           MRN: [id number]             PCP: Carlean Jews, NP Referring: Carlean Jews, NP Visit Date: 09/08/2021 Occupation: @GUAROCC @  Subjective:  Medication management  History of Present Illness: Ann Newton is a 66 y.o. female with history of psoriatic arthritis, psoriasis and osteoarthritis.  She states that she has noticed improvement in her joint swelling and pain since she has been on Tremfya.  She still has psoriasis on her lower extremities and on her feet.  Her last dose of Tremfya was on August 1.  She denies any history of Achilles tendinitis or planter fasciitis.  She has been having some discomfort in her left CMC joint.  Activities of Daily Living:  Patient reports morning stiffness for a few minutes.   Patient Denies nocturnal pain.  Difficulty dressing/grooming: Denies Difficulty climbing stairs: Denies Difficulty getting out of chair: Denies Difficulty using hands for taps, buttons, cutlery, and/or writing: Denies  Review of Systems  Constitutional:  Negative for fatigue.  HENT:  Negative for mouth sores and mouth dryness.   Eyes:  Negative for dryness.  Respiratory:  Negative for shortness of breath.   Cardiovascular:  Negative for chest pain and palpitations.  Gastrointestinal:  Negative for blood in stool, constipation and diarrhea.  Endocrine: Negative for increased urination.  Genitourinary:  Negative for involuntary urination.  Musculoskeletal:  Positive for joint pain, joint pain and joint swelling. Negative for gait problem, myalgias, muscle weakness, morning stiffness, muscle tenderness and myalgias.  Skin:  Positive for rash. Negative for color change, hair loss and sensitivity to sunlight.  Allergic/Immunologic: Negative for susceptible to infections.  Neurological:  Negative for dizziness and headaches.  Hematological:  Negative for swollen glands.   Psychiatric/Behavioral:  Negative for depressed mood and sleep disturbance. The patient is not nervous/anxious.     PMFS History:  Patient Active Problem List   Diagnosis Date Noted   Atopic dermatitis 07/26/2021   Elevated blood pressure reading 05/02/2021   Acute non-recurrent pansinusitis 04/07/2021   Weight loss 02/25/2021   Fever blister 02/04/2021   Edema 02/04/2021   Thyroid nodule greater than or equal to 1 cm in diameter incidentally noted on imaging study 11/20/2018   Fatigue 11/09/2018   Elevated glucose 11/09/2018   Snores 11/09/2018   Hair loss 11/09/2018   Need for influenza vaccination 11/09/2018   Thyromegaly 11/09/2018   Allergic reaction 05/17/2018   Maculopapular rash 05/17/2018   Family history of rheumatoid arthritis 03/09/2018   History of vitamin D deficiency 03/09/2018   Primary osteoarthritis of both knees 02/22/2018   Primary osteoarthritis of both hands 02/22/2018   Neuropathy 11/22/2017   Chronic pain of both knees 11/22/2017   Need for shingles vaccine 11/22/2017   Vitamin D deficiency 01/24/2016   Smoking hx- ( less 1 ppd * 15 yrs) - Quit Jan 17 2013 12/28/2015   Elevated LDL cholesterol level 12/28/2015   Elevated vitamin B12 level 12/28/2015   h/o Mild peripheral edema- takes HCTZ for this 12/20/2015   Dietary B12 deficiency 12/20/2015   History of chickenpox ( Aug '17 )  12/20/2015   Healthcare maintenance 12/20/2015   Body mass index 28.0-28.9, adult 12/03/2015   h/o Hypertriglyceridemia 12/03/2015   Mixed hyperlipidemia 12/03/2015   Pustular psoriasis of palms and  soles 10/28/2010    Past Medical History:  Diagnosis Date   Arthritis    Hyperlipidemia    diet controlled   Psoriasis    Thyroid disease    Wears contact lenses     Family History  Problem Relation Age of Onset   Kidney disease Mother    Diabetes Mother    Heart attack Father    Rheum arthritis Brother    Healthy Daughter    Healthy Son    Heart attack Maternal  Grandmother    Heart attack Maternal Grandfather    Cancer Paternal Grandmother    Heart attack Paternal Grandfather    Rheum arthritis Brother    Colon cancer Neg Hx    Colon polyps Neg Hx    Esophageal cancer Neg Hx    Rectal cancer Neg Hx    Stomach cancer Neg Hx    Past Surgical History:  Procedure Laterality Date   ABDOMINAL HYSTERECTOMY  2000   part   ANTERIOR AND POSTERIOR REPAIR N/A 09/08/2016   Procedure: ANTERIOR (CYSTOCELE) AND POSTERIOR REPAIR (RECTOCELE)/Perineorrhaphy;  Surgeon: Olivia Mackie, MD;  Location: [facility];  Service: Gynecology;  Laterality: N/A;  Requests . Repair of enterocele/posterior   COLONOSCOPY     ~12 yrs ago- normal per pt    COSMETIC SURGERY  1999   breast implants   HAGLAND'S DEFORMITY EXCISION Left 09/13/2013   Procedure: LEFT FOOT: EXCISION INTERDIGITAL MORTON'S NEUROMA SINGLE EACH;  Surgeon: Thera Flake., MD;  Location: [facility];  Service: Orthopedics;  Laterality: Left;   KNEE ARTHROSCOPY  2013,2011   right/left   SHOULDER ARTHROSCOPY     rt   TUBAL LIGATION     WISDOM TOOTH EXTRACTION     Social History   Social History Narrative   Right handed    Immunization History  Administered Date(s) Administered   Influenza,inj,Quad PF,6+ Mos 12/03/2015, 11/22/2017, 11/09/2018, 12/11/2019, 02/02/2021   Moderna Sars-Covid-2 Vaccination 08/13/2019, 08/19/2019, 09/16/2019   Pneumococcal Polysaccharide-23 11/22/2017   Tdap 02/09/2009, 05/29/2019   Zoster Recombinat (Shingrix) 11/09/2018, 05/29/2019     Objective: Vital Signs: BP (!) 141/77 (BP Location: Right Arm, Patient Position: Sitting, Cuff Size: Normal)   Pulse 83   Resp 15   Ht 5\' 8"  (1.727 m)   Wt 178 lb (80.7 kg)   BMI 27.06 kg/m    Physical Exam Vitals and nursing note reviewed.  Constitutional:      Appearance: She is well-developed.  HENT:     Head: Normocephalic and atraumatic.  Eyes:     Conjunctiva/sclera: Conjunctivae normal.   Cardiovascular:     Rate and Rhythm: Normal rate and regular rhythm.     Heart sounds: Normal heart sounds.  Pulmonary:     Effort: Pulmonary effort is normal.     Breath sounds: Normal breath sounds.  Abdominal:     General: Bowel sounds are normal.     Palpations: Abdomen is soft.  Musculoskeletal:     Cervical back: Normal range of motion.  Lymphadenopathy:     Cervical: No cervical adenopathy.  Skin:    General: Skin is warm and dry.     Capillary Refill: Capillary refill takes less than 2 seconds.     Comments: Psoriasis patches were noted over bilateral lower extremities and on her feet.  Neurological:     Mental Status: She is alert and oriented to person, place, and time.  Psychiatric:        Behavior: Behavior normal.  Musculoskeletal Exam: Cervical, thoracic and lumbar spine were in good range of motion.  She had no SI joint tenderness.  Shoulder joints, elbow joints, wrist joints were in good range of motion.  She had bilateral CMC, PIP and DIP thickening with no synovitis.  Hip joints and knee joints were in good range of motion.  She had no warmth swelling or effusion today.  She had no tenderness over Achilles tendon or plantar fascia.  There was no tenderness over ankles or MTPs.  CDAI Exam: CDAI Score: -- Patient Global: --; Provider Global: -- Swollen: --; Tender: -- Joint Exam 09/08/2021   No joint exam has been documented for this visit   There is currently no information documented on the homunculus. Go to the Rheumatology activity and complete the homunculus joint exam.  Investigation: No additional findings.  Imaging: No results found.  Recent Labs: Lab Results  Component Value Date   WBC 8.5 04/01/2021   HGB 14.2 04/01/2021   PLT 398 04/01/2021   NA 140 04/01/2021   K 4.7 04/01/2021   CL 102 04/01/2021   CO2 28 04/01/2021   GLUCOSE 96 04/01/2021   BUN 17 04/01/2021   CREATININE 0.87 04/01/2021   BILITOT 0.4 04/01/2021   ALKPHOS 141  (H) 01/28/2021   AST 19 04/01/2021   ALT 19 04/01/2021   PROT 7.0 04/01/2021   ALBUMIN 4.5 01/28/2021   CALCIUM 10.1 04/01/2021   GFRAA 92 07/09/2020   QFTBGOLDPLUS NEGATIVE 07/09/2020    Speciality Comments: Otezla stopped 08/01/20 Tremfya started 08/03/20  Procedures:  No procedures performed Allergies: Patient has no known allergies.   Assessment / Plan:     Visit Diagnoses: Psoriatic arthropathy (HCC)-her psoriatic arthritis is well controlled on Tremfya 100 mg subcu every 8 weeks.  She denies any history of joint pain or joint swelling.  No synovitis was noted.  She denies history of Planter fasciitis or Achilles tendinitis.  She has been tolerating Tremfya well without any side effects.  She continues to have psoriasis lesions on her bilateral lower extremities and her feet.  Pustular psoriasis of palms and soles - The pustular psoriasis on her palms has resolved since initiating Tremfya on 08/03/2020.  She had psoriasis patches on her bilateral lower extremities and her feet.  We had a detailed discussion regarding switching her from [place] to [place].  Indications side effects contraindications were discussed at length.  Patient states she had a colonoscopy in the past which was negative.  She wants to proceed with Taltz.  Handout was given and consent was taken.  She was advised to stop Tremfya 2 months prior to Taltz injection.  Medication counseling:  Baseline Immunosuppressant Therapy Labs TB GOLD    Latest Ref Rng & Units 07/09/2020    3:32 PM  Quantiferon TB Gold  Quantiferon TB Gold Plus NEGATIVE NEGATIVE    Hepatitis Panel    Latest Ref Rng & Units 02/14/2018    9:59 AM  Hepatitis  Hep B Surface Ag NON-REACTI NON-REACTIVE   Hep B IgM NON-REACTI NON-REACTIVE   Hep C Ab NON-REACTI NON-REACTIVE    Lab Results  Component Value Date   HIV NON-REACTIVE 07/09/2020    Does patient have a history of inflammatory bowel disease? No  Counseled patient that Altamease Oiler is a  [place]-17 inhibitor that works to reduce pain and inflammation associated with arthritis.  Counseled patient on purpose, proper use, and adverse effects of Taltz. Reviewed the most common adverse effects of infection, inflammatory bowel disease,  and allergic reaction. Counseled patient that Altamease Oiler should be held for infection and prior to scheduled surgery.  Counseled patient to avoid live vaccines while on Taltz.  Advised patient to get annual influenza vaccine, pneumococcal vaccine, and Shingrix as indicated.  Reviewed storage information for Taltz.  Reviewed the importance of regular labs while on Taltz. Standing orders placed and is to return in 1 month and then every 3 months after initiation.  Provided patient with medication education material and answered all questions.  Patient consented to Taltz.  Will upload consent into patient's chart.  Will apply for Taltz through patient's insurance and update when we receive a response.  Advised initial injection must be administered in office.  Patient voiced understanding.    Taltz dose will be: For psoriatic arthritis and plaque psoriasis overlap load of 160 mg then 80 mg on weeks 2,4,6,8,10,12 then 80 mg every 28 days  Prescription will be sent to pharmacy pending lab results and insurance approval.   High risk medication use - Tremfya 100 mg sq injections every 8 weeks-initiated on 08/03/20. -Labs obtained on April 01, 2021 were within normal limits.  Lab findings were reviewed with the patient.  TB gold was negative on July 09, 2020.  Plan: CBC with Differential/Platelet, COMPLETE METABOLIC PANEL WITH GFR, QuantiFERON-TB Gold Plus today and then every 3 months.  Information on immunization was placed in the AVS.  She was advised to stop the immunosuppressive agents if she develops an infection and resume after the infection resolves.  Primary osteoarthritis of both hands-she had been lateral PIP and DIP thickening and CMC prominence.  She has been having pain  and discomfort in her left CMC joint.  A prescription for left CMC brace was given.  Primary osteoarthritis of both hips-she had good range of motion without discomfort.  Primary osteoarthritis of both knees-she had issues with knee joint effusion in the past.  No warmth swelling or effusion was noted today.  Paresthesia of both feet -she has intermittent symptoms.  Patient states her nerve conduction velocities were negative for neuropathy.  Family history of rheumatoid arthritis  History of vitamin D deficiency  h/o Hypertriglyceridemia  Smoking hx- ( less 1 ppd * 15 yrs) - Quit Jan 17 2013  Orders: Orders Placed This Encounter  Procedures   CBC with Differential/Platelet   COMPLETE METABOLIC PANEL WITH GFR   QuantiFERON-TB Gold Plus   No orders of the defined types were placed in this encounter.   Follow-Up Instructions: Return in about 5 months (around 02/08/2022) for Psoriatic arthritis, Osteoarthritis.   Pollyann Savoy, MD  Note - This record has been created using Animal nutritionist.  Chart creation errors have been sought, but may not always  have been located. Such creation errors do not reflect on  the standard of medical care.

## 2021-09-08 ENCOUNTER — Ambulatory Visit: Payer: 59 | Attending: Rheumatology | Admitting: Rheumatology

## 2021-09-08 ENCOUNTER — Encounter: Payer: Self-pay | Admitting: Rheumatology

## 2021-09-08 ENCOUNTER — Telehealth: Payer: Self-pay | Admitting: Pharmacist

## 2021-09-08 VITALS — BP 141/77 | HR 83 | Resp 15 | Ht 68.0 in | Wt 178.0 lb

## 2021-09-08 DIAGNOSIS — G629 Polyneuropathy, unspecified: Secondary | ICD-10-CM

## 2021-09-08 DIAGNOSIS — Z87891 Personal history of nicotine dependence: Secondary | ICD-10-CM

## 2021-09-08 DIAGNOSIS — L4050 Arthropathic psoriasis, unspecified: Secondary | ICD-10-CM

## 2021-09-08 DIAGNOSIS — E781 Pure hyperglyceridemia: Secondary | ICD-10-CM

## 2021-09-08 DIAGNOSIS — M19041 Primary osteoarthritis, right hand: Secondary | ICD-10-CM | POA: Diagnosis not present

## 2021-09-08 DIAGNOSIS — M25462 Effusion, left knee: Secondary | ICD-10-CM

## 2021-09-08 DIAGNOSIS — Z79899 Other long term (current) drug therapy: Secondary | ICD-10-CM | POA: Diagnosis not present

## 2021-09-08 DIAGNOSIS — L403 Pustulosis palmaris et plantaris: Secondary | ICD-10-CM | POA: Diagnosis not present

## 2021-09-08 DIAGNOSIS — R202 Paresthesia of skin: Secondary | ICD-10-CM

## 2021-09-08 DIAGNOSIS — Z8261 Family history of arthritis: Secondary | ICD-10-CM

## 2021-09-08 DIAGNOSIS — M160 Bilateral primary osteoarthritis of hip: Secondary | ICD-10-CM

## 2021-09-08 DIAGNOSIS — Z8639 Personal history of other endocrine, nutritional and metabolic disease: Secondary | ICD-10-CM

## 2021-09-08 DIAGNOSIS — M19042 Primary osteoarthritis, left hand: Secondary | ICD-10-CM

## 2021-09-08 DIAGNOSIS — M170 Bilateral primary osteoarthritis of knee: Secondary | ICD-10-CM

## 2021-09-08 NOTE — Patient Instructions (Addendum)
Standing Labs We placed an order today for your standing lab work.   Please have your standing labs drawn in November and every 3 months  If possible, please have your labs drawn 2 weeks prior to your appointment so that the provider can discuss your results at your appointment.  Please note that you may see your imaging and lab results in [facility] before we have reviewed them. We may be awaiting multiple results to interpret others before contacting you. Please allow our office up to 72 hours to thoroughly review all of the results before contacting the office for clarification of your results.  We currently have open lab daily: Monday through Thursday from 1:30 PM-4:30 PM and Friday from 1:30 PM- 4:00 PM If possible, please come for your lab work on Monday, Thursday or Friday afternoons, as you may experience shorter wait times.   Effective November 17, 2021 the new lab hours will change to: Monday through Thursday from 1:30 PM-5:00 PM and Friday from 8:30 AM-12:00 PM If possible, please come for your lab work on Monday and Thursday afternoons, as you may experience shorter wait times.  Please be advised, all patients with office appointments requiring lab work will take precedent over walk-in lab work.    The office is located at [address] [place], [place], [place] [address] No appointment is necessary.   Labs are drawn by [facility]. Please bring your co-pay at the time of your lab draw.  You may receive a bill from [facility] for your lab work.  Please note if you are on Hydroxychloroquine and and an order has been placed for a Hydroxychloroquine level, you will need to have it drawn 4 hours or more after your last dose.  If you wish to have your labs drawn at another location, please call the office 24 hours in advance to send orders.  If you have any questions regarding directions or hours of operation,  please call [phone].   As a reminder, please drink plenty of water prior  to coming for your lab work. Thanks!   Vaccines You are taking a medication(s) that can suppress your immune system.  The following immunizations are recommended: Flu annually Covid-19  Td/Tdap (tetanus, diphtheria, pertussis) every 10 years Pneumonia (Prevnar 15 then Pneumovax 23 at least 1 year apart.  Alternatively, can take Prevnar 20 without needing additional dose) Shingrix: 2 doses from 4 weeks to 6 months apart  Please check with your PCP to make sure you are up to date.   If you have signs or symptoms of an infection or start antibiotics: First, call your PCP for workup of your infection. Hold your medication through the infection, until you complete your antibiotics, and until symptoms resolve if you take the following: Injectable medication (Actemra, Benlysta, Cimzia, Cosentyx, Enbrel, Humira, Kevzara, Orencia, Remicade, Simponi, Stelara, Taltz, Tremfya) Methotrexate Leflunomide (Arava) Mycophenolate (Cellcept) Morrie Sheldon, Olumiant, or Rinvoq   Ixekizumab Injection What is this medication? IXEKIZUMAB (ix e KIZ ue mab) treats autoimmune conditions, such as psoriasis and arthritis. It works by slowing down an overactive immune system. It is a monoclonal antibody. This medicine may be used for other purposes; ask your health care provider or pharmacist if you have questions. COMMON BRAND NAME(S): TALTZ What should I tell my care team before I take this medication? They need to know if you have any of these conditions: Immune system problems Infection, such as viral infection, chickenpox, cold sores, or herpes Recently received or are scheduled to receive  a vaccine Tuberculosis, a positive skin test for tuberculosis, or recent close contact with someone who has tuberculosis An unusual or allergic reaction to ixekizumab, other medications, foods, dyes, or preservatives Pregnant or trying to get pregnant Breast-feeding How should I use this medication? This medication is  injected under the skin. It is usually given by your care team in a hospital or clinic setting. It may also be given at home. If you get this medication at home, you will be taught how to prepare and give it. Use exactly as directed. Take it as directed on the prescription label at the same time every day. Keep taking it unless your care team tells you to stop. It is important that you put your used needles and syringes in a special sharps container. Do not put them in a trash can. If you do not have a sharps container, call your pharmacist or care team to get one. A special MedGuide will be given to you by the pharmacist with each prescription and refill. If you are getting this medication in a hospital or clinic, a special MedGuide will be given to you before each treatment. Be sure to read this information carefully each time. Talk to your care team about the use of this medication in children. While it be prescribed for children as young as 6 years for selected conditions, precautions do apply. Overdosage: If you think you have taken too much of this medicine contact a poison control center or emergency room at once. NOTE: This medicine is only for you. Do not share this medicine with others. What if I miss a dose? If you get this medication at the hospital or clinic: It is important not to miss your dose. Call your care team if you are unable to keep an appointment. If you give yourself the medication at home: If you miss a dose, take it as soon as you can. Then continue your normal schedule. Do not take double or extra doses. Call your care team with questions. What may interact with this medication? Do not take this medication with any of the following: Live virus vaccines This medication may also interact with the following: Inactivated vaccines This list may not describe all possible interactions. Give your health care provider a list of all the medicines, herbs, non-prescription drugs, or  dietary supplements you use. Also tell them if you smoke, drink alcohol, or use illegal drugs. Some items may interact with your medicine. What should I watch for while using this medication? Visit your care team for regular checks on your progress. Tell your care team if your symptoms do not start to get better or if they get worse. You will be tested for tuberculosis (TB) before you start this medication. If your care team prescribes any medication for TB, you should start taking the TB medication before starting this medication. Make sure to finish the full course of TB medication. This medication may increase your risk of getting an infection. Call your care team for advice if you get a fever, chills, sore throat, or other symptoms of a cold or flu. Do not treat yourself. Try to avoid being around people who are sick. This medication can decrease the response to a vaccine. If you need to get vaccinated, tell your care team if you have received this medication within the last 6 months. Extra booster doses may be needed. Talk to your care team to see if a different vaccination schedule is needed. What side effects  may I notice from receiving this medication? Side effects that you should report to your care team as soon as possible: Allergic reactions--skin rash, itching, hives, swelling of the face, lips, tongue, or throat Infection--fever, chills, cough, sore throat, wounds that don't heal, pain or trouble when passing urine, general feeling of discomfort or being unwell Sudden or severe stomach pain, bloody diarrhea, fever, nausea, vomiting Side effects that usually do not require medical attention (report to your care team if they continue or are bothersome): Nausea Pain, redness, or irritation at injection site Runny or stuffy nose Sore throat This list may not describe all possible side effects. Call your doctor for medical advice about side effects. You may report side effects to FDA at  1-800-FDA-1088. Where should I keep my medication? Keep out of the reach of children and pets. Store in the refrigerator. Do not freeze. Do not shake. Keep this medication in the original container. Protect from light. Use the dose within 30 minutes of removing it from the refrigerator. Get rid of any unused medication after the expiration date. To get rid of medications that are no longer needed or have expired: Take the medication to a medication take-back program. Check with your pharmacy or law enforcement to find a location. If you cannot return the medication, ask your pharmacist or care team how to get rid of this medication safely. NOTE: This sheet is a summary. It may not cover all possible information. If you have questions about this medicine, talk to your doctor, pharmacist, or health care provider.  2023 Elsevier/Gold Standard (2021-03-17 00:00:00)

## 2021-09-08 NOTE — Telephone Encounter (Addendum)
Please start Taltz BIV.  Dose: For psoriatic arthritis and plaque psoriasis overlap load of 160 mg then 80 mg on weeks 2,4,6,8,10,12 then 80 mg every 28 days    Can run for 2 mL for 28 days to include the loading dose  Dx: Psoriatic arthritis (L40.5) and Psoriasis (L40.9)  Previously tried therapies: Tremfya - started July 2022 with waning clinical response Otezla - waning clinical response (from 3 years prior to July 2022)  Patient is eligible for copay card once approved. She is turning 66 y/o in December 2023 and will need to determine if she is transitioning to Medicare.  Patient will be able to start Taltz 8 weeks after her last Tremfya dose.  Chesley Mires, PharmD, MPH, BCPS, CPP Clinical Pharmacist (Rheumatology and Pulmonology)  ----- Message from Ellen Henri, CMA sent at 09/08/2021  2:26 PM EDT ----- Please apply for taltz per Sherron Ales, PA-C. Thanks!   Consent was obtained and sent to the scan center.

## 2021-09-09 NOTE — Progress Notes (Signed)
CBC and CMP are normal.

## 2021-09-10 NOTE — Telephone Encounter (Signed)
Submitted a Prior Authorization request to [facility] for TALTZ via CoverMyMeds. Will update once we receive a response.  Key: Dwain Sarna, PharmD, MPH, BCPS, CPP Clinical Pharmacist (Rheumatology and Pulmonology)

## 2021-09-11 LAB — COMPLETE METABOLIC PANEL WITH GFR
AG Ratio: 1.4 (calc) (ref 1.0–2.5)
ALT: 25 U/L (ref 6–29)
AST: 21 U/L (ref 10–35)
Albumin: 4.1 g/dL (ref 3.6–5.1)
Alkaline phosphatase (APISO): 131 U/L (ref 37–153)
BUN: 13 mg/dL (ref 7–25)
CO2: 27 mmol/L (ref 20–32)
Calcium: 9.5 mg/dL (ref 8.6–10.4)
Chloride: 102 mmol/L (ref 98–110)
Creat: 0.8 mg/dL (ref 0.50–1.05)
Globulin: 2.9 g/dL (calc) (ref 1.9–3.7)
Glucose, Bld: 75 mg/dL (ref 65–99)
Potassium: 4.8 mmol/L (ref 3.5–5.3)
Sodium: 140 mmol/L (ref 135–146)
Total Bilirubin: 0.4 mg/dL (ref 0.2–1.2)
Total Protein: 7 g/dL (ref 6.1–8.1)
eGFR: 82 mL/min/{1.73_m2} (ref >=60)

## 2021-09-11 LAB — CBC WITH DIFFERENTIAL/PLATELET
Absolute Monocytes: 679 cells/uL (ref 200–950)
Basophils Absolute: 39 cells/uL (ref 0–200)
Basophils Relative: 0.5 %
Eosinophils Absolute: 133 cells/uL (ref 15–500)
Eosinophils Relative: 1.7 %
HCT: 42.3 % (ref 35.0–45.0)
Hemoglobin: 14.5 g/dL (ref 11.7–15.5)
Lymphs Abs: 2956 cells/uL (ref 850–3900)
MCH: 31 pg (ref 27.0–33.0)
MCHC: 34.3 g/dL (ref 32.0–36.0)
MCV: 90.4 fL (ref 80.0–100.0)
MPV: 10 fL (ref 7.5–12.5)
Monocytes Relative: 8.7 %
Neutro Abs: 3994 cells/uL (ref 1500–7800)
Neutrophils Relative %: 51.2 %
Platelets: 416 10*3/uL — ABNORMAL HIGH (ref 140–400)
RBC: 4.68 10*6/uL (ref 3.80–5.10)
RDW: 12.7 % (ref 11.0–15.0)
Total Lymphocyte: 37.9 %
WBC: 7.8 10*3/uL (ref 3.8–10.8)

## 2021-09-11 LAB — QUANTIFERON-TB GOLD PLUS
Mitogen-NIL: 9.73 IU/mL
NIL: 0.02 IU/mL
QuantiFERON-TB Gold Plus: NEGATIVE
TB1-NIL: 0 IU/mL
TB2-NIL: <0 IU/mL

## 2021-09-13 NOTE — Progress Notes (Signed)
TB Gold is negative.

## 2021-09-13 NOTE — Telephone Encounter (Signed)
Received a fax regarding Prior Authorization from [facility] for TALTZ. Authorization has been DENIED. Must provide documentation of ALL of the following: 1) you have history of fialure, contraindication, or intolerance to one of the preferred biologics (Cimzia, adalimumab, Simponi, Stelara, Skyrizi, Enbrel), AND 2) one of the following: tried and failed 6 month trial of Cosentyx, or have history of intolerance to Cosentyx AND 3) you have history of failure, contraindication, or intolerance to one of the following: Abbe Amsterdam, or Rinvoq  Patient has contraindication to Cimzia, adalimumab, Simponi, Enbrel due to recurrent problems with precancerous lesions on her extremities excised by her dermatologist per patient. Patient does not have contraindication to JAK inhibitors (former smoker).   Routing to dr. Corliss Skains on advisement to switch to Cosentyx  Chesley Mires, PharmD, MPH, BCPS, CPP Clinical Pharmacist (Rheumatology and Pulmonology)

## 2021-09-14 ENCOUNTER — Telehealth: Payer: Self-pay | Admitting: Pharmacist

## 2021-09-14 NOTE — Telephone Encounter (Signed)
Submitted a Prior Authorization request to [facility] for COSENTYX via CoverMyMeds. Will update once we receive a response.  Key: Pollyann Samples, PharmD, MPH, BCPS, CPP Clinical Pharmacist (Rheumatology and Pulmonology)

## 2021-09-14 NOTE — Telephone Encounter (Signed)
Yes

## 2021-09-14 NOTE — Telephone Encounter (Signed)
Please start Cosentyx BIV.  Dose:  for plaque psoriasis +/- psoriatic arthritis 300 mg every 7 days for 5 weeks then 300 mg every 28 days.   Dx: Psoriatic arthritis (L40.5) and Psoriasis (L40.9)  Previously tried therapies: Tremfya - started July 2022 with waning clinical response Otezla - waning clinical response (from 3 years prior to July 2022)  Therapies patient unable to try: TNF inhibitors: Enbrel, Humira, Simponi, Cimzia, Remicade - recurrent problems with precancerous lesions on her extremities excised by her dermatologist  Former smoker - ideally avoid JAK inhibitors (Rinvoq, Olumiant, Xeljanz) due to risk for MACE  Once approved, patient will need to be enrolled into copay card. Patient will also need written consent at new start visit.  Chesley Mires, PharmD, MPH, BCPS, CPP Clinical Pharmacist (Rheumatology and Pulmonology)

## 2021-09-14 NOTE — Telephone Encounter (Addendum)
Pharmacy Note  Subjective:  Patient called today by [facility] pharmacist for counseling on Cosentyx for psoriatic arthritis and plaque psoriasis. Altamease Oiler was denied.  History of inflammatory bowel disease: No  Objective:  CBC    Component Value Date/Time   WBC 7.8 09/08/2021 1409   RBC 4.68 09/08/2021 1409   HGB 14.5 09/08/2021 1409   HGB 14.0 01/28/2021 1502   HCT 42.3 09/08/2021 1409   HCT 40.1 01/28/2021 1502   PLT 416 (H) 09/08/2021 1409   PLT 415 01/28/2021 1502   MCV 90.4 09/08/2021 1409   MCV 87 01/28/2021 1502   MCH 31.0 09/08/2021 1409   MCHC 34.3 09/08/2021 1409   RDW 12.7 09/08/2021 1409   RDW 12.7 01/28/2021 1502   LYMPHSABS 2,956 09/08/2021 1409   LYMPHSABS WILL FOLLOW 06/14/2016 0832   MONOABS 0.7 12/11/2019 0847   EOSABS 133 09/08/2021 1409   EOSABS WILL FOLLOW 06/14/2016 0832   BASOSABS 39 09/08/2021 1409   BASOSABS WILL FOLLOW 06/14/2016 0832    CMP     Component Value Date/Time   NA 140 09/08/2021 1409   NA 141 01/28/2021 1502   K 4.8 09/08/2021 1409   CL 102 09/08/2021 1409   CO2 27 09/08/2021 1409   GLUCOSE 75 09/08/2021 1409   BUN 13 09/08/2021 1409   BUN 14 01/28/2021 1502   CREATININE 0.80 09/08/2021 1409   CALCIUM 9.5 09/08/2021 1409   PROT 7.0 09/08/2021 1409   PROT 7.1 01/28/2021 1502   ALBUMIN 4.5 01/28/2021 1502   AST 21 09/08/2021 1409   ALT 25 09/08/2021 1409   ALKPHOS 141 (H) 01/28/2021 1502   BILITOT 0.4 09/08/2021 1409   BILITOT 0.3 01/28/2021 1502   GFRNONAA 80 07/09/2020 1532   GFRAA 92 07/09/2020 1532    Baseline Immunosuppressant Therapy Labs TB GOLD    Latest Ref Rng & Units 09/08/2021    2:09 PM  Quantiferon TB Gold  Quantiferon TB Gold Plus NEGATIVE NEGATIVE    Hepatitis Panel    Latest Ref Rng & Units 02/14/2018    9:59 AM  Hepatitis  Hep B Surface Ag NON-REACTI NON-REACTIVE   Hep B IgM NON-REACTI NON-REACTIVE   Hep C Ab NON-REACTI NON-REACTIVE    HIV Lab Results  Component Value Date    HIV NON-REACTIVE 07/09/2020   Immunoglobulins    Latest Ref Rng & Units 07/09/2020    3:32 PM  Immunoglobulin Electrophoresis  IgA  70 - 320 mg/dL 748   IgG 270 - 7,867 mg/dL 5,449   IgM 50 - 201 mg/dL 007    SPEP    Latest Ref Rng & Units 09/08/2021    2:09 PM  Serum Protein Electrophoresis  Total Protein 6.1 - 8.1 g/dL 7.0    H2RF No results found for: "G6PDH" TPMT No results found for: "TPMT"   Chest x-ray: 10/21/2012 - No active cardiopulmonary disease.   Assessment/Plan:  Counseled patient that Cosentyx is a IL-17 inhibitor.  Counseled patient on purpose, proper use, and adverse effects of Cosentyx. Reviewed the most common adverse effects of infection (more commonly nasopharyngitis, URTI), inflammatory bowel disease, and allergic reaction.  Counseled patient that Cosentyx should be held prior to scheduled surgery.   Reviewed the importance of regular labs while on Cosentyx.  Will monitor CBC and CMP 1 month after starting and every 3 months routinely thereafter. Will monitor TB gold annually.  Standing orders placed.  Provided patient with medication education material and answered all questions.  Patient VERBALLY  consented to Cosentyx.  Will obtain written consent at new start visit.  Will apply for Cosentyx through patient's insurance.  Reviewed storage information for Cosentyx.  Advised initial injection must be administered in office.  Patient voiced understanding.    Patient dose will be for plaque psoriasis +/- psoriatic arthritis 300 mg every 7 days for 5 weeks then 300 mg every 28 days.  Prescription will be sent to pharmacy pending lab results and insurance approval.  Chesley Mires, PharmD, MPH, BCPS, CPP Clinical Pharmacist (Rheumatology and Pulmonology)

## 2021-09-16 ENCOUNTER — Other Ambulatory Visit (HOSPITAL_COMMUNITY): Payer: Self-pay

## 2021-09-16 NOTE — Telephone Encounter (Signed)
Received notification from [facility] regarding a prior authorization for COSENTYX. Authorization has been APPROVED from 09/15/21 to 10/15/21 (for loading dose of 49ml).   Unable to run test claim because patient must fill through [facility]: [phone]   Authorization # [id number]  Enrolled patient into Cosentyx copay card: BIN: [id number] PCN: [id number] GRP: [id number] ID: [id number]  ATC to schedule COSENTYX new start. Unable to reach. Will be able to start 8 weeks after last Tremfya dose. Will f/u  Chesley Mires, PharmD, MPH, BCPS, CPP Clinical Pharmacist (Rheumatology and Pulmonology)

## 2021-09-17 ENCOUNTER — Telehealth: Payer: Self-pay | Admitting: Rheumatology

## 2021-09-17 NOTE — Telephone Encounter (Signed)
Patient returned call . Her last dose of Tremfya was on 08/10/21.  Cosentyx new start visit scheduled for 10/06/21. Will use sample  Chesley Mires, PharmD, MPH, BCPS, CPP Clinical Pharmacist (Rheumatology and Pulmonology)

## 2021-09-17 NOTE — Telephone Encounter (Signed)
Patient called the office requesting to speak with [facility].

## 2021-09-17 NOTE — Telephone Encounter (Signed)
Returned call to pt. Scheduled for Cosentyx new start on 10/06/21  Chesley Mires, PharmD, MPH, BCPS, CPP Clinical Pharmacist (Rheumatology and Pulmonology)

## 2021-09-27 NOTE — Patient Instructions (Signed)
Your next COSENTYX dose is due on 10/13/21, 10/20/21, 10/27/21, 11/03/21, and every 4 weeks thereafter starting on 12/01/21 (remember: it will be two injections every time)  HOLD COSENTYX if you have signs or symptoms of an infection. You can resume once you feel better or back to your baseline. HOLD COSENTYX if you start antibiotics to treat an infection. HOLD COSENTYX around the time of surgery/procedures. Your surgeon will be able to provide recommendations on when to hold BEFORE and when you are cleared to RESUME.  Pharmacy information: Your prescription will be shipped from [facility]. Their phone number is [phone] Please call to schedule shipment and confirm address. They will mail your medication to your home.  Cost information: Your copay should be affordable. If you call the pharmacy and it is not affordable, please double-check that they are billing through your copay card as secondary coverage. That copay card information is: BIN: [id number] PCN: [id number] GRP: [id number] ID: [id number]  Labs are due in 1 month then every 3 months. Lab hours are from Monday to Thursday 1:30-4:30pm and Friday 1:30-4pm. You do not need an appointment if you come for labs during these times.  How to manage an injection site reaction: Remember the 5 C's: COUNTER - leave on the counter at least 30 minutes but up to overnight to bring medication to room temperature. This may help prevent stinging COLD - place something cold (like an ice gel pack or cold water bottle) on the injection site just before cleansing with alcohol. This may help reduce pain CLARITIN - use Claritin (generic name is loratadine) for the first two weeks of treatment or the day of, the day before, and the day after injecting. This will help to minimize injection site reactions CORTISONE CREAM - apply if injection site is irritated and itching CALL ME - if injection site reaction is bigger than the size of your fist,  looks infected, blisters, or if you develop hives

## 2021-09-27 NOTE — Progress Notes (Signed)
Pharmacy Note  Subjective:   Patient presents to clinic today to receive first dose of Cosentyx for psoriatic arthritis/psoriasis overlap. Patient currently takes Tremfya - started 08/03/20. Her last dose of Tremfya was on 08/10/21.  Patient running a fever or have signs/symptoms of infection? No  Patient currently on antibiotics for the treatment of infection? No  Patient have any upcoming invasive procedures/surgeries? No  Objective: CMP     Component Value Date/Time   NA 140 09/08/2021 1409   NA 141 01/28/2021 1502   K 4.8 09/08/2021 1409   CL 102 09/08/2021 1409   CO2 27 09/08/2021 1409   GLUCOSE 75 09/08/2021 1409   BUN 13 09/08/2021 1409   BUN 14 01/28/2021 1502   CREATININE 0.80 09/08/2021 1409   CALCIUM 9.5 09/08/2021 1409   PROT 7.0 09/08/2021 1409   PROT 7.1 01/28/2021 1502   ALBUMIN 4.5 01/28/2021 1502   AST 21 09/08/2021 1409   ALT 25 09/08/2021 1409   ALKPHOS 141 (H) 01/28/2021 1502   BILITOT 0.4 09/08/2021 1409   BILITOT 0.3 01/28/2021 1502   GFRNONAA 80 07/09/2020 1532   GFRAA 92 07/09/2020 1532    CBC    Component Value Date/Time   WBC 7.8 09/08/2021 1409   RBC 4.68 09/08/2021 1409   HGB 14.5 09/08/2021 1409   HGB 14.0 01/28/2021 1502   HCT 42.3 09/08/2021 1409   HCT 40.1 01/28/2021 1502   PLT 416 (H) 09/08/2021 1409   PLT 415 01/28/2021 1502   MCV 90.4 09/08/2021 1409   MCV 87 01/28/2021 1502   MCH 31.0 09/08/2021 1409   MCHC 34.3 09/08/2021 1409   RDW 12.7 09/08/2021 1409   RDW 12.7 01/28/2021 1502   LYMPHSABS 2,956 09/08/2021 1409   LYMPHSABS WILL FOLLOW 06/14/2016 0832   MONOABS 0.7 12/11/2019 0847   EOSABS 133 09/08/2021 1409   EOSABS WILL FOLLOW 06/14/2016 0832   BASOSABS 39 09/08/2021 1409   BASOSABS WILL FOLLOW 06/14/2016 0832    Baseline Immunosuppressant Therapy Labs TB GOLD    Latest Ref Rng & Units 09/08/2021    2:09 PM  Quantiferon TB Gold  Quantiferon TB Gold Plus NEGATIVE NEGATIVE    Hepatitis Panel    Latest Ref Rng  & Units 02/14/2018    9:59 AM  Hepatitis  Hep B Surface Ag NON-REACTI NON-REACTIVE   Hep B IgM NON-REACTI NON-REACTIVE   Hep C Ab NON-REACTI NON-REACTIVE    HIV Lab Results  Component Value Date   HIV NON-REACTIVE 07/09/2020   Immunoglobulins    Latest Ref Rng & Units 07/09/2020    3:32 PM  Immunoglobulin Electrophoresis  IgA  70 - 320 mg/dL 409   IgG 811 - 9,147 mg/dL 8,295   IgM 50 - 621 mg/dL 308    SPEP    Latest Ref Rng & Units 09/08/2021    2:09 PM  Serum Protein Electrophoresis  Total Protein 6.1 - 8.1 g/dL 7.0    M5HQ No results found for: "G6PDH" TPMT No results found for: "TPMT"   Chest x-ray: 10/21/2012 - No active cardiopulmonary disease  Assessment/Plan:  Counseled patient that Cosentyx is a IL-17 inhibitor.  Counseled patient on purpose, proper use, and adverse effects of Cosentyx. Reviewed the most common adverse effects of infection (more commonly nasopharyngitis, URTI), inflammatory bowel disease, and allergic reaction.  Counseled patient that Cosentyx should be held prior to scheduled surgery.  Counseled patient to avoid live vaccines while on Cosentyx.  Recommend annual influenza, PCV 15 or PCV20 or  Pneumovax 23, and Shingrix as indicated.  Reviewed the importance of regular labs while on Cosentyx.  Will monitor CBC and CMP 1 month after starting and every 3 months routinely thereafter. Will monitor TB gold annually.  Standing orders placed.  Provided patient with medication education material and answered all questions.  Patient consented to Cosentyx.  Will upload consent into patient's chart. Reviewed storage information for Cosentyx. Patient voiced understanding.    Demonstrated proper injection technique with Cosentyx demo device  Patient able to demonstrate proper injection technique using the teach back method.  Patient self injected in the right upper thigh and left upper thigh with:  Sample Medication: Cosentyx Sensoready Pen 150mg /ml x 2 pens = total  dose of 300mg  NDC: Lot: Expiration: 07/2022  Patient tolerated well.  Observed for 30 mins in office for adverse reaction and no irritation or itchiness noted by patient. Provider notes no swelling or redness at either injection site   Patient is to return in 1 month for labs and 6-8 weeks for follow-up appointment.  Standing orders placed.   Cosentyx approved through insurance .   Rx sent to: [facility]: [phone] .  Patient provided with pharmacy phone number and advised to call later this week to schedule shipment to home.  Patient will continue Cosentyx 300mg  SQ at Weeks 0 (administered in clinic today), Week 1, 2, 3, and 4, then 300mg  SQ every 4 weeks thereafter  All questions encouraged and answered.  Instructed patient to call with any further questions or concerns.  08/2022, PharmD, MPH, BCPS, CPP Clinical Pharmacist (Rheumatology and Pulmonology)  09/27/2021 11:10 AM

## 2021-09-29 ENCOUNTER — Other Ambulatory Visit: Payer: Self-pay | Admitting: Obstetrics and Gynecology

## 2021-09-30 ENCOUNTER — Other Ambulatory Visit: Payer: Self-pay | Admitting: Obstetrics and Gynecology

## 2021-09-30 DIAGNOSIS — E2839 Other primary ovarian failure: Secondary | ICD-10-CM

## 2021-10-04 ENCOUNTER — Other Ambulatory Visit: Payer: Self-pay | Admitting: Physician Assistant

## 2021-10-04 DIAGNOSIS — L403 Pustulosis palmaris et plantaris: Secondary | ICD-10-CM

## 2021-10-04 DIAGNOSIS — L4050 Arthropathic psoriasis, unspecified: Secondary | ICD-10-CM

## 2021-10-06 ENCOUNTER — Ambulatory Visit: Payer: 59 | Attending: Rheumatology | Admitting: Pharmacist

## 2021-10-06 DIAGNOSIS — L4050 Arthropathic psoriasis, unspecified: Secondary | ICD-10-CM

## 2021-10-06 DIAGNOSIS — L403 Pustulosis palmaris et plantaris: Secondary | ICD-10-CM

## 2021-10-06 DIAGNOSIS — Z79899 Other long term (current) drug therapy: Secondary | ICD-10-CM

## 2021-10-06 DIAGNOSIS — Z7189 Other specified counseling: Secondary | ICD-10-CM

## 2021-10-06 MED ORDER — COSENTYX SENSOREADY (300 MG) 150 MG/ML ~~LOC~~ SOAJ: SUBCUTANEOUS | 0 refills | Status: DC

## 2021-10-26 ENCOUNTER — Ambulatory Visit: Payer: 59 | Admitting: Nurse Practitioner

## 2021-11-02 ENCOUNTER — Ambulatory Visit: Payer: 59 | Admitting: Nurse Practitioner

## 2021-11-14 ENCOUNTER — Other Ambulatory Visit: Payer: Self-pay | Admitting: Rheumatology

## 2021-11-14 DIAGNOSIS — Z79899 Other long term (current) drug therapy: Secondary | ICD-10-CM

## 2021-11-14 DIAGNOSIS — L4050 Arthropathic psoriasis, unspecified: Secondary | ICD-10-CM

## 2021-11-14 DIAGNOSIS — L403 Pustulosis palmaris et plantaris: Secondary | ICD-10-CM

## 2021-11-15 NOTE — Telephone Encounter (Signed)
Next Visit: 12/01/2021  Last Visit: 09/08/2021  Last Fill: 10/06/2021 (Loading Dose)  HY:[id number] arthropathy   Current Dose per phone note 09/14/2021: Cosentyx 300 mg every 28 days.  Labs: 09/08/2021 CBC and CMP are normal.   TB Gold: 09/08/2021 Neg    Okay to refill Cosentyx?

## 2021-11-17 NOTE — Progress Notes (Signed)
Office Visit Note  Patient: Ann Newton             Date of Birth: [birth year]           MRN: [id number]             PCP: Carlean Jews, NP Referring: Carlean Jews, NP Visit Date: 12/01/2021 Occupation: @GUAROCC @  Subjective:  Medication monitoring   History of Present Illness: Ann Newton is a 66 y.o. female with history of psoriatic arthritis and osteoarthritis.  She remains on Cosentyx 300mg  sq every 28 days. She was started on cosentyx on 10/06/21. She completed the loading dose without any side effects or injection site reactions.  Patient reports that her most recent injection was administered today.  She states her last dose was administered 1 month ago.  She states that leading up to this injection she has noticed a recurrence of pustular psoriasis on both feet.  She states that these are new pustules and have been painful.  She has been using clobetasol ointment and calcipotriene ointment with minimal improvement.  Patient reports that she did not have an issue with pustular psoriasis while on Tremfya. She denies any increased joint pain or joint swelling since switching to Cosentyx.  She denies any Achilles tendinitis or plantar fasciitis.  She denies any SI joint discomfort at this time.  Activities of Daily Living:  Patient reports morning stiffness for a few minutes.   Patient Denies nocturnal pain.  Difficulty dressing/grooming: Denies Difficulty climbing stairs: Denies Difficulty getting out of chair: Denies Difficulty using hands for taps, buttons, cutlery, and/or writing: Denies  Review of Systems  Constitutional:  Negative for fatigue.  HENT:  Negative for mouth sores and mouth dryness.   Eyes:  Negative for dryness.  Respiratory:  Negative for shortness of breath.   Cardiovascular:  Negative for chest pain and palpitations.  Gastrointestinal:  Negative for blood in stool, constipation and diarrhea.  Endocrine: Negative for increased urination.   Genitourinary:  Negative for involuntary urination.  Musculoskeletal:  Positive for joint pain, joint pain, joint swelling, myalgias, morning stiffness, muscle tenderness and myalgias. Negative for gait problem and muscle weakness.  Skin:  Negative for color change, rash, hair loss and sensitivity to sunlight.  Allergic/Immunologic: Negative for susceptible to infections.  Neurological:  Negative for dizziness and headaches.  Hematological:  Negative for swollen glands.  Psychiatric/Behavioral:  Negative for depressed mood and sleep disturbance. The patient is not nervous/anxious.     PMFS History:  Patient Active Problem List   Diagnosis Date Noted   Atopic dermatitis 07/26/2021   Elevated blood pressure reading 05/02/2021   Acute non-recurrent pansinusitis 04/07/2021   Weight loss 02/25/2021   Fever blister 02/04/2021   Edema 02/04/2021   Thyroid nodule greater than or equal to 1 cm in diameter incidentally noted on imaging study 11/20/2018   Fatigue 11/09/2018   Elevated glucose 11/09/2018   Snores 11/09/2018   Hair loss 11/09/2018   Need for influenza vaccination 11/09/2018   Thyromegaly 11/09/2018   Allergic reaction 05/17/2018   Maculopapular rash 05/17/2018   Family history of rheumatoid arthritis 03/09/2018   History of vitamin D deficiency 03/09/2018   Primary osteoarthritis of both knees 02/22/2018   Primary osteoarthritis of both hands 02/22/2018   Neuropathy 11/22/2017   Chronic pain of both knees 11/22/2017   Need for shingles vaccine 11/22/2017   Vitamin D deficiency 01/24/2016   Smoking hx- ( less 1 ppd * 15 yrs) -  Quit Jan 17 2013 12/28/2015   Elevated LDL cholesterol level 12/28/2015   Elevated vitamin B12 level 12/28/2015   h/o Mild peripheral edema- takes HCTZ for this 12/20/2015   Dietary B12 deficiency 12/20/2015   History of chickenpox ( Aug '17 )  12/20/2015   Healthcare maintenance 12/20/2015   Body mass index 28.0-28.9, adult 12/03/2015   h/o  Hypertriglyceridemia 12/03/2015   Mixed hyperlipidemia 12/03/2015   Pustular psoriasis of palms and soles 10/28/2010    Past Medical History:  Diagnosis Date   Arthritis    Hyperlipidemia    diet controlled   Psoriasis    Thyroid disease    Wears contact lenses     Family History  Problem Relation Age of Onset   Kidney disease Mother    Diabetes Mother    Heart attack Father    Rheum arthritis Brother    Healthy Daughter    Healthy Son    Heart attack Maternal Grandmother    Heart attack Maternal Grandfather    Cancer Paternal Grandmother    Heart attack Paternal Grandfather    Rheum arthritis Brother    Colon cancer Neg Hx    Colon polyps Neg Hx    Esophageal cancer Neg Hx    Rectal cancer Neg Hx    Stomach cancer Neg Hx    Past Surgical History:  Procedure Laterality Date   ABDOMINAL HYSTERECTOMY  2000   part   ANTERIOR AND POSTERIOR REPAIR N/A 09/08/2016   Procedure: ANTERIOR (CYSTOCELE) AND POSTERIOR REPAIR (RECTOCELE)/Perineorrhaphy;  Surgeon: Olivia Mackie, MD;  Location: [facility];  Service: Gynecology;  Laterality: N/A;  Requests . Repair of enterocele/posterior   COLONOSCOPY     ~12 yrs ago- normal per pt    COSMETIC SURGERY  1999   breast implants   HAGLAND'S DEFORMITY EXCISION Left 09/13/2013   Procedure: LEFT FOOT: EXCISION INTERDIGITAL MORTON'S NEUROMA SINGLE EACH;  Surgeon: Thera Flake., MD;  Location: [facility];  Service: Orthopedics;  Laterality: Left;   KNEE ARTHROSCOPY  2013,2011   right/left   SHOULDER ARTHROSCOPY     rt   TUBAL LIGATION     WISDOM TOOTH EXTRACTION     Social History   Social History Narrative   Right handed    Immunization History  Administered Date(s) Administered   Influenza,inj,Quad PF,6+ Mos 12/03/2015, 11/22/2017, 11/09/2018, 12/11/2019, 02/02/2021   Moderna Sars-Covid-2 Vaccination 08/13/2019, 08/19/2019, 09/16/2019   Pneumococcal Polysaccharide-23 11/22/2017   Tdap 02/09/2009, 05/29/2019    Zoster Recombinat (Shingrix) 11/09/2018, 05/29/2019     Objective: Vital Signs: BP 128/75 (BP Location: Left Arm, Patient Position: Sitting, Cuff Size: Normal)   Pulse 96   Resp 14   Ht 5\' 8"  (1.727 m)   Wt 188 lb (85.3 kg)   BMI 28.59 kg/m    Physical Exam Vitals and nursing note reviewed.  Constitutional:      Appearance: She is well-developed.  HENT:     Head: Normocephalic and atraumatic.  Eyes:     Conjunctiva/sclera: Conjunctivae normal.  Cardiovascular:     Rate and Rhythm: Normal rate and regular rhythm.     Heart sounds: Normal heart sounds.  Pulmonary:     Effort: Pulmonary effort is normal.     Breath sounds: Normal breath sounds.  Abdominal:     General: Bowel sounds are normal.     Palpations: Abdomen is soft.  Musculoskeletal:     Cervical back: Normal range of motion.  Skin:    General:  Skin is warm and dry.     Capillary Refill: Capillary refill takes less than 2 seconds.  Neurological:     Mental Status: She is alert and oriented to person, place, and time.  Psychiatric:        Behavior: Behavior normal.       Musculoskeletal Exam: C-spine, thoracic spine, lumbar spine have good range of motion.  No midline spinal tenderness or SI joint tenderness.  Shoulder joints, elbow joints, wrist joints, MCPs, PIPs, DIPs have good range of motion with no synovitis.  Thickening over CMC joints.  PIP and DIP thickening consistent with osteoarthritis of both hands.  Complete fist formation bilaterally.  Hip joints have good range of motion with no groin pain.  Knee joints have good range of motion with no warmth or effusion.  Ankle joints have good range of motion with no tenderness or joint swelling.  No evidence of Achilles tendinitis or planter fasciitis.  No tenderness or synovitis over MTP joints.  CDAI Exam: CDAI Score: -- Patient Global: --; Provider Global: -- Swollen: --; Tender: -- Joint Exam 12/01/2021   No joint exam has been documented for this visit    There is currently no information documented on the homunculus. Go to the Rheumatology activity and complete the homunculus joint exam.  Investigation: No additional findings.  Imaging: No results found.  Recent Labs: Lab Results  Component Value Date   WBC 7.8 09/08/2021   HGB 14.5 09/08/2021   PLT 416 (H) 09/08/2021   NA 140 09/08/2021   K 4.8 09/08/2021   CL 102 09/08/2021   CO2 27 09/08/2021   GLUCOSE 75 09/08/2021   BUN 13 09/08/2021   CREATININE 0.80 09/08/2021   BILITOT 0.4 09/08/2021   ALKPHOS 141 (H) 01/28/2021   AST 21 09/08/2021   ALT 25 09/08/2021   PROT 7.0 09/08/2021   ALBUMIN 4.5 01/28/2021   CALCIUM 9.5 09/08/2021   GFRAA 92 07/09/2020   QFTBGOLDPLUS NEGATIVE 09/08/2021    Speciality Comments: Otezla stopped 08/01/20 Tremfya started 08/03/20 - stopped 08/10/21 Cosentyx started 10/06/21  Procedures:  No procedures performed Allergies: Patient has no known allergies.   Assessment / Plan:     Visit Diagnoses: Psoriatic arthropathy (HCC): She has no synovitis or dactylitis on examination today.  She has not been experiencing any increased arthralgias or joint stiffness recently.  Patient presents today with recurrence of pustular psoriasis on the plantar aspect of both feet.  Patient previously had pustular psoriasis on her palms which resolved after initiating Tremfya in July 2022.  At the patient's last office visit on 09/08/2021 the plan was to switch from Tremfya to Cosentyx due to a flare of plaque psoriasis on bilateral lower extremities.  She initiated Cosentyx on 10/06/2021.  She has not noticed any improvement in the psoriasis on her lower legs and has now had a recurrence of pustular psoriasis on the plantar aspect of both feet.  She has been using clobetasol ointment and calcipotriene ointment topically as needed with minimal improvement.  She has not followed up with her dermatologist.  Her most recent dose of Cosentyx was administered today on  12/01/2021.  Patient is not ready to make any medication changes at this time.  She is willing to give Cosentyx more time and for [name] to reassess the full efficacy at her follow-up visit.  She requested a prednisone taper which has resolved her pustular psoriasis in the past.  A prednisone taper starting at 20 mg tapering by 5  mg every 4 days was sent to the pharmacy.  Refills of clobetasol ointment and calcipotriene were also sent to the pharmacy.  She was encouraged to schedule a follow-up visit with her dermatologist for further evaluation as well.  She will follow-up in the office in 6 to 8 weeks or sooner if needed.  Pustular psoriasis of palms and soles - History of pustular psoriasis on palms had resolved on Tremfya in July 2022.  Recurrence of pustular psoriasis on the plantar aspect of both feet since switching from Tremfya to Cosentyx. It is unclear if the erythematous lesions on her distal shins is truly plaque psoriasis since it has not been responsive to topical agents, Tremfya, or Cosentyx.  She was encouraged to schedule an appointment with her dermatologist for further evaluation regarding these lesions. For management of the pustular psoriasis refills of clobetasol ointment and calcipotriene were sent to the pharmacy today.  Patient also requested a prescription for prednisone which has cleared her pustular psoriasis in the past.  A prednisone taper starting at 20 mg tapering by 5 mg every 4 days was sent to the pharmacy.  Advised the patient to take prednisone in the morning with food and to avoid the use of NSAIDs while taking prednisone. Patient is willing to give consent takes more time prior to making any medication changes.  We can discuss combination therapy at her follow-up visit if she continues to have ongoing pustular psoriasis. Recommended scheduling a follow-up visit with her dermatologist.  High risk medication use - Cosentyx 300mg  sq every 28 days. Initiated cosentyx on  10/06/21.  Previously on Tremfya and Otezla CBC and CMP updated on 09/08/21.  Orders for CBC and CMP released today.  Her next lab work will be due in February and every 3 months.   Standing orders for CBC and CMP placed today.  TB gold negative on 09/08/21.  She has not had any recent or recurrent infections.  Discussed the importance of holding cosentyx if she develops signs or symptoms of an infection and to resume once the infection has completely cleared.   - Plan: CBC with Differential/Platelet, COMPLETE METABOLIC PANEL WITH GFR, CBC with Differential/Platelet, COMPLETE METABOLIC PANEL WITH GFR  Primary osteoarthritis of both hands: Thickening of CMC joints, PIPs, and DIP joints.  No active inflammation noted.   Primary osteoarthritis of both hips: She has good ROM with no groin pain currently.   Primary osteoarthritis of both knees: She has good ROM of both knee joints with no warmth or effusion.    Other medical conditions are listed as follows:  Paresthesia of both feet  Family history of rheumatoid arthritis  History of vitamin D deficiency  h/o Hypertriglyceridemia  Smoking hx- ( less 1 ppd * 15 yrs) - Quit Jan 17 2013  Orders: Orders Placed This Encounter  Procedures   CBC with Differential/Platelet   COMPLETE METABOLIC PANEL WITH GFR   CBC with Differential/Platelet   COMPLETE METABOLIC PANEL WITH GFR   Meds ordered this encounter  Medications   predniSONE (DELTASONE) 5 MG tablet    Sig: Take 4 tablets by mouth daily x4 days, 3 tablets daily x4 days, 2 tablets daily x4 days, 1 tablet daily x4 days.    Dispense:  40 tablet    Refill:  0   clobetasol ointment (TEMOVATE) 0.05 %    Sig: Apply to affected area up to two times daily for up to two weeks.    Dispense:  30 g  Refill:  2   calcipotriene (DOVONOX) 0.005 % ointment    Sig: APPLY TO AFFECTED AREA ON THE SKIN DAILY FOR 2 WEEKS.    Dispense:  120 g    Refill:  2    Follow-Up Instructions: Return in about  2 months (around 01/31/2022) for Psoriatic arthritis.   Gearldine Bienenstock, PA-C  Note - This record has been created using Dragon software.  Chart creation errors have been sought, but may not always  have been located. Such creation errors do not reflect on  the standard of medical care.

## 2021-11-30 ENCOUNTER — Ambulatory Visit: Payer: 59 | Admitting: Nurse Practitioner

## 2021-12-01 ENCOUNTER — Ambulatory Visit: Payer: 59 | Attending: Physician Assistant | Admitting: Physician Assistant

## 2021-12-01 ENCOUNTER — Encounter: Payer: Self-pay | Admitting: Physician Assistant

## 2021-12-01 VITALS — BP 128/75 | HR 96 | Resp 14 | Ht 68.0 in | Wt 188.0 lb

## 2021-12-01 DIAGNOSIS — E781 Pure hyperglyceridemia: Secondary | ICD-10-CM

## 2021-12-01 DIAGNOSIS — Z79899 Other long term (current) drug therapy: Secondary | ICD-10-CM | POA: Diagnosis not present

## 2021-12-01 DIAGNOSIS — M19041 Primary osteoarthritis, right hand: Secondary | ICD-10-CM | POA: Diagnosis not present

## 2021-12-01 DIAGNOSIS — Z87891 Personal history of nicotine dependence: Secondary | ICD-10-CM

## 2021-12-01 DIAGNOSIS — L403 Pustulosis palmaris et plantaris: Secondary | ICD-10-CM

## 2021-12-01 DIAGNOSIS — M170 Bilateral primary osteoarthritis of knee: Secondary | ICD-10-CM

## 2021-12-01 DIAGNOSIS — M19042 Primary osteoarthritis, left hand: Secondary | ICD-10-CM

## 2021-12-01 DIAGNOSIS — L4050 Arthropathic psoriasis, unspecified: Secondary | ICD-10-CM

## 2021-12-01 DIAGNOSIS — Z8639 Personal history of other endocrine, nutritional and metabolic disease: Secondary | ICD-10-CM

## 2021-12-01 DIAGNOSIS — Z8261 Family history of arthritis: Secondary | ICD-10-CM

## 2021-12-01 DIAGNOSIS — M160 Bilateral primary osteoarthritis of hip: Secondary | ICD-10-CM

## 2021-12-01 DIAGNOSIS — R202 Paresthesia of skin: Secondary | ICD-10-CM

## 2021-12-01 LAB — COMPLETE METABOLIC PANEL WITH GFR
AG Ratio: 1.6 (calc) (ref 1.0–2.5)
ALT: 35 U/L — ABNORMAL HIGH (ref 6–29)
AST: 27 U/L (ref 10–35)
Albumin: 3.9 g/dL (ref 3.6–5.1)
Alkaline phosphatase (APISO): 118 U/L (ref 37–153)
BUN: 18 mg/dL (ref 7–25)
CO2: 31 mmol/L (ref 20–32)
Calcium: 8.9 mg/dL (ref 8.6–10.4)
Chloride: 104 mmol/L (ref 98–110)
Creat: 0.83 mg/dL (ref 0.50–1.05)
Globulin: 2.5 g/dL (calc) (ref 1.9–3.7)
Glucose, Bld: 106 mg/dL — ABNORMAL HIGH (ref 65–99)
Potassium: 4.4 mmol/L (ref 3.5–5.3)
Sodium: 140 mmol/L (ref 135–146)
Total Bilirubin: 0.3 mg/dL (ref 0.2–1.2)
Total Protein: 6.4 g/dL (ref 6.1–8.1)
eGFR: 79 mL/min/{1.73_m2} (ref >=60)

## 2021-12-01 LAB — CBC WITH DIFFERENTIAL/PLATELET
Absolute Monocytes: 771 cells/uL (ref 200–950)
Basophils Absolute: 38 cells/uL (ref 0–200)
Basophils Relative: 0.4 %
Eosinophils Absolute: 301 cells/uL (ref 15–500)
Eosinophils Relative: 3.2 %
HCT: 39.3 % (ref 35.0–45.0)
Hemoglobin: 13.3 g/dL (ref 11.7–15.5)
Lymphs Abs: 3196 cells/uL (ref 850–3900)
MCH: 30.6 pg (ref 27.0–33.0)
MCHC: 33.8 g/dL (ref 32.0–36.0)
MCV: 90.6 fL (ref 80.0–100.0)
MPV: 10.5 fL (ref 7.5–12.5)
Monocytes Relative: 8.2 %
Neutro Abs: 5095 cells/uL (ref 1500–7800)
Neutrophils Relative %: 54.2 %
Platelets: 379 10*3/uL (ref 140–400)
RBC: 4.34 10*6/uL (ref 3.80–5.10)
RDW: 12.3 % (ref 11.0–15.0)
Total Lymphocyte: 34 %
WBC: 9.4 10*3/uL (ref 3.8–10.8)

## 2021-12-01 LAB — HM MAMMOGRAPHY

## 2021-12-01 MED ORDER — PREDNISONE 5 MG PO TABS: ORAL_TABLET | ORAL | 0 refills | Status: DC

## 2021-12-01 MED ORDER — CALCIPOTRIENE 0.005 % EX OINT: TOPICAL_OINTMENT | CUTANEOUS | 2 refills | Status: DC

## 2021-12-01 MED ORDER — CLOBETASOL PROPIONATE 0.05 % EX OINT: TOPICAL_OINTMENT | CUTANEOUS | 2 refills | Status: DC

## 2021-12-02 NOTE — Progress Notes (Signed)
CBC is normal.  CMP reveals mildly elevated liver function.  Please advise patient to avoid all NSAIDs and alcohol use.  She is on meloxicam currently which could be contributing to elevated liver function.

## 2021-12-09 ENCOUNTER — Encounter: Payer: Self-pay | Admitting: Rheumatology

## 2021-12-13 ENCOUNTER — Telehealth: Payer: Self-pay | Admitting: Pharmacist

## 2021-12-13 NOTE — Telephone Encounter (Signed)
Please advise patient that we can apply for Ann Newton which is in the same family as Ann Newton and may be more effective in clearing her skin.  Ann Newton, please discussed with the patient and see if she would like to switch to Ann Newton and we can apply for it.  Ann Newton can be prescribed by her dermatologist as it is not approved for psoriatic arthritis.   Please advise that she should see a dermatologist to evaluate the rash and make sure there is no secondary infection.  We cannot give prednisone unless infection is ruled out.

## 2021-12-13 NOTE — Telephone Encounter (Addendum)
I spoke with patient. She states she has not had blisters in years and would like to switch to [facility] if that is what Dr. Corliss Skains recommends. I reviewed dosing of Week 0, Week 4, then every 12 weeks. Reviewed possibility of infections.  She confirms she sees Dr. Kenard Gower at [facility] and will plan to call them today to schedule appt  Her last dose of Cosentyx was on 12/01/21 so will not be able to start Skyrizi until 12/29/21. Will start Skyrizi BIV in new phone encounter  I reviewed that Dr. Corliss Skains recommends she f/u with dermatology in regards to starting prednisone and advised that she should not take high-dose prednisone if there is active infection with rash/blisters. She is confident that there is no infection but I urged her again to consult with dermatology first prior to starting prednisone. She was prescribed prednisone by Sherron Ales, PA-C at last OV on 12/01/21 but she states that she never started it.  Patient advised that Spevigo is IV infusion for psoriasis only and would not help with PsA. Also advised that it is IL-23 inhibitor so would put her at risk in combination with Skyrizi.  Chesley Mires, PharmD, MPH, BCPS, CPP Clinical Pharmacist (Rheumatology and Pulmonology)

## 2021-12-13 NOTE — Telephone Encounter (Signed)
Submitted a Prior Authorization request to [facility] for SKYRIZI via CoverMyMeds. Will update once we receive a response.  Key: [id number]  Chesley Mires, PharmD, MPH, BCPS, CPP Clinical Pharmacist (Rheumatology and Pulmonology)

## 2021-12-13 NOTE — Telephone Encounter (Addendum)
Please start SKYRIZI BIV.  Dose: 150mg  at Week 0, Week 4, then every 12 weeks therafter (can process PA for 1 pen per 84 days)  Dx: Psoriatic arthritis (L40.5) and Psoriasis (L40.9)  Previously tried therapies: Cosentyx - started 10/06/21 - rash, blister, worsening of psoriasis Tremfya - 08/03/20 - stopped 08/10/21 Otezla stopped 08/01/20  Her last dose of Cosentyx was on 12/01/21 so will not be able to start Skyrizi until 12/29/21.  Once approved, she will need to be enrolled into copay card htru [employer] portal  12/31/21, PharmD, MPH, BCPS, CPP Clinical Pharmacist (Rheumatology and Pulmonology)

## 2021-12-15 ENCOUNTER — Telehealth: Payer: Self-pay | Admitting: *Deleted

## 2021-12-15 NOTE — Telephone Encounter (Signed)
Patient called the office about the new start to the [facility]. Please reach out to the patient.

## 2021-12-16 ENCOUNTER — Other Ambulatory Visit (HOSPITAL_COMMUNITY): Payer: Self-pay

## 2021-12-16 ENCOUNTER — Ambulatory Visit: Payer: 59 | Admitting: Nurse Practitioner

## 2021-12-16 NOTE — Telephone Encounter (Signed)
Returnd call to patient. She states she met with dermatology (Dr. Ronnald Ramp) yesterday and was advised that [facility] would be great next choice since it does appear that Cosentyx is causing a reaction. She was prescribed a cream by Dr. Ronnald Ramp so will be using that instead of taking prednisone taper.  Scheduled for Skyrizi new start on 12/30/21  Knox Saliva, PharmD, MPH, BCPS, CPP Clinical Pharmacist (Rheumatology and Pulmonology)

## 2021-12-16 NOTE — Telephone Encounter (Signed)
Received notification from [facility] regarding a prior authorization for [facility]. Authorization has been APPROVED from 12/13/21 to 12/14/22.   Unable to run test claim because patient must fill through [facility]: [phone]   Authorization #  [id number]  Patient is scheduled for Skyrizi new start on 12/30/21. Will use sample  Copay card information remains pending in [employer] Portal. Will continue to f/u  Chesley Mires, PharmD, MPH, BCPS, CPP Clinical Pharmacist (Rheumatology and Pulmonology)

## 2021-12-17 NOTE — Telephone Encounter (Signed)
Skyrizi copay card information in portal: ID: [id number] Issued: 12/16/2021 GROUP: [id number] BIN: [id number] PCN: [id number]  Chesley Mires, PharmD, MPH, BCPS, CPP Clinical Pharmacist (Rheumatology and Pulmonology)

## 2021-12-30 ENCOUNTER — Ambulatory Visit (INDEPENDENT_AMBULATORY_CARE_PROVIDER_SITE_OTHER): Payer: 59

## 2021-12-30 ENCOUNTER — Ambulatory Visit
Admission: EM | Admit: 2021-12-30 | Discharge: 2021-12-30 | Disposition: A | Discharge status: Home / Self Care | Payer: 59 | Attending: Urgent Care | Admitting: Urgent Care

## 2021-12-30 ENCOUNTER — Ambulatory Visit: Payer: 59 | Attending: Rheumatology | Admitting: Pharmacist

## 2021-12-30 DIAGNOSIS — L403 Pustulosis palmaris et plantaris: Secondary | ICD-10-CM

## 2021-12-30 DIAGNOSIS — R0781 Pleurodynia: Secondary | ICD-10-CM | POA: Diagnosis not present

## 2021-12-30 DIAGNOSIS — L4050 Arthropathic psoriasis, unspecified: Secondary | ICD-10-CM

## 2021-12-30 DIAGNOSIS — Z7189 Other specified counseling: Secondary | ICD-10-CM

## 2021-12-30 DIAGNOSIS — Z79899 Other long term (current) drug therapy: Secondary | ICD-10-CM

## 2021-12-30 MED ORDER — NAPROXEN 375 MG PO TABS: 375.0000 mg | ORAL_TABLET | Freq: Two times a day (BID) | ORAL | 0 refills | Status: DC

## 2021-12-30 MED ORDER — TIZANIDINE HCL 4 MG PO TABS: 4.0000 mg | ORAL_TABLET | Freq: Every day | ORAL | 0 refills | Status: DC

## 2021-12-30 MED ORDER — SKYRIZI PEN 150 MG/ML ~~LOC~~ SOAJ: SUBCUTANEOUS | 1 refills | Status: DC

## 2021-12-30 NOTE — ED Triage Notes (Signed)
Pt c/o mid back pain started after she bent over to put her hair in a towel-NAD-steady gait

## 2021-12-30 NOTE — Patient Instructions (Signed)
Your next SKYRIZI dose is due on 01/27/2022, 04/21/2022, and every 12 weeks thereafter  HOLD SKYRIZI if you have signs or symptoms of an infection. You can resume once you feel better or back to your baseline. HOLD SKYRIZI if you start antibiotics to treat an infection. HOLD SKYRIZI around the time of surgery/procedures. Your surgeon will be able to provide recommendations on when to hold BEFORE and when you are cleared to RESUME.  Pharmacy information: Your prescription will be shipped from [facility]. Their phone number is [phone] Please call to schedule shipment and confirm address. They will mail your medication to your home.  Cost information: Your copay should be affordable. If you call the pharmacy and it is not affordable, please double-check that they are billing through your copay card as secondary coverage. That copay card information is: Issued: 12/16/2021 ID: [id number] GROUP: [id number] BIN: [id number] PCN: [id number]  Labs are due in 1 month then every 3 months. Lab hours are from Monday to Thursday 8am-12:30pm and 1pm-5pm and Friday 8am-12pm. You do not need an appointment if you come for labs during these times.  How to manage an injection site reaction: Remember the 5 C's: COUNTER - leave on the counter at least 30 minutes but up to overnight to bring medication to room temperature. This may help prevent stinging COLD - place something cold (like an ice gel pack or cold water bottle) on the injection site just before cleansing with alcohol. This may help reduce pain CLARITIN - use Claritin (generic name is loratadine) for the first two weeks of treatment or the day of, the day before, and the day after injecting. This will help to minimize injection site reactions CORTISONE CREAM - apply if injection site is irritated and itching CALL ME - if injection site reaction is bigger than the size of your fist, looks infected, blisters, or if you develop hives

## 2021-12-30 NOTE — ED Provider Notes (Signed)
[facility]  Note:  This document was prepared using Conservation officer, historic buildings and may include unintentional dictation errors.  MRN: [id number] DOB: [birth year]  Subjective:   Ann Newton is a 66 y.o. female presenting for 1 day history of acute onset severe left lower lateral rib pain.  Symptoms started after she bent down to put her hair in a towel and as she came back up she felt a sudden sharp pain.  At rest, she does not feel pain but is very sharp when she has any kind of movement at the level of her torso over this area.  No bruising, rash, blunt force trauma.  No history of rib injury.  No history of osteoporosis, osteopenia.  No current facility-administered medications for this encounter.  Current Outpatient Medications:    acyclovir (ZOVIRAX) 400 MG tablet, Take 1 tablet po TID for 5 days. Begin taking at first sign of fever blister, Disp: 30 tablet, Rfl: 2   calcipotriene (DOVONOX) 0.005 % ointment, APPLY TO AFFECTED AREA ON THE SKIN DAILY FOR 2 WEEKS., Disp: 120 g, Rfl: 2   clobetasol ointment (TEMOVATE) 0.05 %, Apply to affected area up to two times daily for up to two weeks., Disp: 30 g, Rfl: 2   diclofenac sodium (VOLTAREN) 1 % GEL, 3 grams to 3 large joints up to 3 times daily, Disp: 3 Tube, Rfl: 3   EFUDEX 5 % cream, Apply topically., Disp: , Rfl:    hydrochlorothiazide (HYDRODIURIL) 12.5 MG tablet, Take 1 tablet (12.5 mg total) by mouth daily., Disp: 90 tablet, Rfl: 1   meloxicam (MOBIC) 7.5 MG tablet, Take 7.5 mg by mouth as needed., Disp: , Rfl:    Multiple Vitamins-Minerals (CENTRUM SILVER 50+WOMEN) TABS, Take 1 tablet by mouth daily., Disp: , Rfl:    phentermine (ADIPEX-P) 37.5 MG tablet, Take 1 tablet (37.5 mg total) by mouth daily before breakfast., Disp: 30 tablet, Rfl: 2   Risankizumab-rzaa (SKYRIZI PEN) 150 MG/ML SOAJ, Inject 150mg  into the skin at Week 4 then every 12 weeks (Week 0 received in clinic on 12/30/21), Disp: 1 mL,  Rfl: 1   No Known Allergies  Past Medical History:  Diagnosis Date   Arthritis    Hyperlipidemia    diet controlled   Psoriasis    Thyroid disease    Wears contact lenses      Past Surgical History:  Procedure Laterality Date   ABDOMINAL HYSTERECTOMY  2000   part   ANTERIOR AND POSTERIOR REPAIR N/A 09/08/2016   Procedure: ANTERIOR (CYSTOCELE) AND POSTERIOR REPAIR (RECTOCELE)/Perineorrhaphy;  Surgeon: 09/10/2016, MD;  Location: [facility];  Service: Gynecology;  Laterality: N/A;  Requests Olivia Mackie. Repair of enterocele/posterior   COLONOSCOPY     ~12 yrs ago- normal per pt    COSMETIC SURGERY  1999   breast implants   HAGLAND'S DEFORMITY EXCISION Left 09/13/2013   Procedure: LEFT FOOT: EXCISION INTERDIGITAL MORTON'S NEUROMA SINGLE EACH;  Surgeon: 09/15/2013., MD;  Location: [facility];  Service: Orthopedics;  Laterality: Left;   KNEE ARTHROSCOPY  2013,2011   right/left   SHOULDER ARTHROSCOPY     rt   TUBAL LIGATION     WISDOM TOOTH EXTRACTION      Family History  Problem Relation Age of Onset   Kidney disease Mother    Diabetes Mother    Heart attack Father    Rheum arthritis Brother    Healthy Daughter    Healthy Son  Heart attack Maternal Grandmother    Heart attack Maternal Grandfather    Cancer Paternal Grandmother    Heart attack Paternal Grandfather    Rheum arthritis Brother    Colon cancer Neg Hx    Colon polyps Neg Hx    Esophageal cancer Neg Hx    Rectal cancer Neg Hx    Stomach cancer Neg Hx     Social History   Tobacco Use   Smoking status: Former    Packs/day: 0.50    Years: 10.00    Total pack years: 5.00    Types: Cigarettes    Quit date: 09/12/2012    Years since quitting: 9.3   Smokeless tobacco: Never  Vaping Use   Vaping Use: Never used  Substance Use Topics   Alcohol use: No   Drug use: No    ROS   Objective:   Vitals: BP 133/79 (BP Location: Right Arm)   Pulse 76   Temp 98 F (36.7 C) (Oral)   Resp  20   SpO2 97%   Physical Exam Constitutional:      General: She is not in acute distress.    Appearance: Normal appearance. She is well-developed. She is not ill-appearing, toxic-appearing or diaphoretic.  HENT:     Head: Normocephalic and atraumatic.     Nose: Nose normal.     Mouth/Throat:     Mouth: Mucous membranes are moist.  Eyes:     General: No scleral icterus.       Right eye: No discharge.        Left eye: No discharge.     Extraocular Movements: Extraocular movements intact.  Cardiovascular:     Rate and Rhythm: Normal rate and regular rhythm.     Heart sounds: Normal heart sounds. No murmur heard.    No friction rub. No gallop.  Pulmonary:     Effort: Pulmonary effort is normal. No respiratory distress.     Breath sounds: No stridor. No wheezing, rhonchi or rales.  Chest:     Chest wall: Tenderness (left lateral lower ribs) present.  Skin:    General: Skin is warm and dry.  Neurological:     General: No focal deficit present.     Mental Status: She is alert and oriented to person, place, and time.  Psychiatric:        Mood and Affect: Mood normal.        Behavior: Behavior normal.     DG Ribs Unilateral W/Chest Left  Result Date: 12/30/2021 CLINICAL DATA:  Left rib pain. EXAM: LEFT RIBS AND CHEST - 3+ VIEW COMPARISON:  10/21/2012 chest x-ray. FINDINGS: The cardiac silhouette, mediastinal and hilar contours are normal. The lungs are clear of an acute process. No pleural effusions or pneumothorax. Dedicated views of the left ribs demonstrate a remote healed seventh posterior rib fracture. No acute rib fractures are identified. IMPRESSION: 1. No acute cardiopulmonary findings. 2. Remote healed left seventh posterior rib fracture. No acute rib fractures. Electronically Signed   By: Rudie Meyer M.D.   On: 12/30/2021 11:08     Assessment and Plan :   PDMP not reviewed this encounter.  1. Rib pain on left side     No rash and therefore unlikely to be shingles.   Discussed that if the rash develops she would need to be seen again to get treated for shingles.  Otherwise, recommended managing for rib pain, rib strain, thoracic strain with naproxen and tizanidine.  I did  review incidental finding of what is likely a remote fracture of the left seventh rib and is likely affecting her symptoms today.  Counseled patient on potential for adverse effects with medications prescribed/recommended today, ER and return-to-clinic precautions discussed, patient verbalized understanding.    Wallis Bamberg, PA-C 12/30/21 1259

## 2021-12-30 NOTE — Progress Notes (Signed)
Pharmacy Note  Subjective:   Patient presents to clinic today to receive first dose of SKYRIZI. She is switching from Cosentyx which made her psoriasis significantly worse. Last dose of Cosentyx was on 12/01/2021. Prior to this, she was on Tremfya (from 08/03/20 through 08/10/21)  She states she rash and psoriasis has not improved on her palms since she sent MyChart message. Derm prescribed her topical for patches on legs which has improved. States she was advised by derm to wait out palmar pustules to self-resolve.  She also states she is having back pain frm potentially pulling a musc  Patient running a fever or have signs/symptoms of infection? No  Patient currently on antibiotics for the treatment of infection? No  Patient have any upcoming invasive procedures/surgeries? No  Objective: CMP     Component Value Date/Time   NA 140 12/01/2021 1327   NA 141 01/28/2021 1502   K 4.4 12/01/2021 1327   CL 104 12/01/2021 1327   CO2 31 12/01/2021 1327   GLUCOSE 106 (H) 12/01/2021 1327   BUN 18 12/01/2021 1327   BUN 14 01/28/2021 1502   CREATININE 0.83 12/01/2021 1327   CALCIUM 8.9 12/01/2021 1327   PROT 6.4 12/01/2021 1327   PROT 7.1 01/28/2021 1502   ALBUMIN 4.5 01/28/2021 1502   AST 27 12/01/2021 1327   ALT 35 (H) 12/01/2021 1327   ALKPHOS 141 (H) 01/28/2021 1502   BILITOT 0.3 12/01/2021 1327   BILITOT 0.3 01/28/2021 1502   GFRNONAA 80 07/09/2020 1532   GFRAA 92 07/09/2020 1532    CBC    Component Value Date/Time   WBC 9.4 12/01/2021 1327   RBC 4.34 12/01/2021 1327   HGB 13.3 12/01/2021 1327   HGB 14.0 01/28/2021 1502   HCT 39.3 12/01/2021 1327   HCT 40.1 01/28/2021 1502   PLT 379 12/01/2021 1327   PLT 415 01/28/2021 1502   MCV 90.6 12/01/2021 1327   MCV 87 01/28/2021 1502   MCH 30.6 12/01/2021 1327   MCHC 33.8 12/01/2021 1327   RDW 12.3 12/01/2021 1327   RDW 12.7 01/28/2021 1502   LYMPHSABS 3,196 12/01/2021 1327   LYMPHSABS WILL FOLLOW 06/14/2016 0832   MONOABS  0.7 12/11/2019 0847   EOSABS 301 12/01/2021 1327   EOSABS WILL FOLLOW 06/14/2016 0832   BASOSABS 38 12/01/2021 1327   BASOSABS WILL FOLLOW 06/14/2016 0832    Baseline Immunosuppressant Therapy Labs TB GOLD    Latest Ref Rng & Units 09/08/2021    2:09 PM  Quantiferon TB Gold  Quantiferon TB Gold Plus NEGATIVE NEGATIVE    Hepatitis Panel    Latest Ref Rng & Units 02/14/2018    9:59 AM  Hepatitis  Hep B Surface Ag NON-REACTI NON-REACTIVE   Hep B IgM NON-REACTI NON-REACTIVE   Hep C Ab NON-REACTI NON-REACTIVE    HIV Lab Results  Component Value Date   HIV NON-REACTIVE 07/09/2020   Immunoglobulins    Latest Ref Rng & Units 07/09/2020    3:32 PM  Immunoglobulin Electrophoresis  IgA  70 - 320 mg/dL 327   IgG 614 - 7,092 mg/dL 9,574   IgM 50 - 734 mg/dL 037    SPEP    Latest Ref Rng & Units 12/01/2021    1:27 PM  Serum Protein Electrophoresis  Total Protein 6.1 - 8.1 g/dL 6.4    Chest x-ray: 09/22/4381 - No active cardiopulmonary disease  Assessment/Plan:  Counseled patient that Cristy Folks is a IL-23 inhibitor.  Counseled patient on purpose, proper use, and  adverse effects of Skyrizi.  Reviewed the most common adverse effects including infection, URTIs, headache, fatigue, tinea infections, and injection site. Counseled patient that Cristy Folks should be held for infection and prior to scheduled surgery.  Recommend annual influenza, PCV 15 or PCV20 or Pneumovax 23, and Shingrix as indicated. Advised that live vaccines should be avoided while taking Skyrizi.  Reviewed the importance of regular labs while on Skyrizi therapy.  Will monitor CBC and CMP 1 month after starting and then every 3 months routinely thereafter. Will monitor TB gold annually. Standing orders placed.  Provided patient with medication education material and answered all questions.  Patient voiced understanding.  Patient consented to [place].  Will upload consent into the media tab.  Reviewed storage instructions of  Skyrizi.  Patient verbalized understanding.    Reviewed importance of holding SKYRIZI with signs/symptoms of an infections, if antibiotics are prescribed to treat an active infection, and with invasive surgeries/procedures  Demonstrated proper injection technique with SKYRIZI demo device  Patient able to demonstrate proper injection technique using the teach back method.  Patient self injected in the right upper thigh with:  Sample Medication: Skyrizi 150mg /mL pen NDC: Lot: [id number] Expiration: 02/2022  Patient tolerated well.  Observed for 30 mins in office for adverse reaction and none noted. Patient denies itchiness, irritation.   Patient is to return in 1 month for labs and 6-8 weeks for follow-up appointment.  Standing orders already remain in place.  SKYRIZI approved through insurance .   Rx sent to: [facility]: [phone] .  Patient provided with pharmacy phone number and advised to call later this week to schedule shipment to home.  Patient will continue Skyrizi 150mg  at Week 0 (administered in clinic today), Week 4 (01/27/2022), then every 12 weeks thereafter.  All questions encouraged and answered.  Instructed patient to call with any further questions or concerns.  , PharmD, MPH, BCPS, CPP Clinical Pharmacist (Rheumatology and Pulmonology)  12/30/2021 8:53 AM

## 2021-12-31 ENCOUNTER — Ambulatory Visit: Payer: Self-pay

## 2022-01-21 ENCOUNTER — Encounter: Payer: Self-pay | Admitting: Emergency Medicine

## 2022-01-21 ENCOUNTER — Ambulatory Visit
Admission: EM | Admit: 2022-01-21 | Discharge: 2022-01-21 | Disposition: A | Discharge status: Home / Self Care | Payer: 59 | Attending: Physician Assistant | Admitting: Physician Assistant

## 2022-01-21 ENCOUNTER — Ambulatory Visit (INDEPENDENT_AMBULATORY_CARE_PROVIDER_SITE_OTHER): Payer: 59

## 2022-01-21 DIAGNOSIS — M948X9 Other specified disorders of cartilage, unspecified sites: Secondary | ICD-10-CM | POA: Diagnosis not present

## 2022-01-21 DIAGNOSIS — R0789 Other chest pain: Secondary | ICD-10-CM

## 2022-01-21 MED ORDER — IBUPROFEN 600 MG PO TABS: 600.0000 mg | ORAL_TABLET | Freq: Four times a day (QID) | ORAL | 0 refills | Status: DC | PRN

## 2022-01-21 MED ORDER — PREDNISONE 10 MG PO TABS: 10.0000 mg | ORAL_TABLET | Freq: Three times a day (TID) | ORAL | 0 refills | Status: DC

## 2022-01-21 MED ORDER — HYDROCODONE-ACETAMINOPHEN 5-325 MG PO TABS: 2.0000 | ORAL_TABLET | ORAL | 0 refills | Status: DC | PRN

## 2022-01-21 NOTE — Discharge Instructions (Addendum)
Advised to take the Vicodin tablets, 1-2 every 6-8 hours as needed for acute pain relief.  (Be cautious with this medication as it does cause drowsiness and constipation so it is advised to use a stool softener/laxative when taking this medicine)  Advised take ibuprofen 600 mg every 8 hours on a regular basis with food to help decrease the pain. Advised take prednisone 10 mg, 1 every 8 hours for 5 days to help reduce the acute inflammatory process.  Advised follow-up PCP or return to urgent care if symptoms fail to improve.

## 2022-01-21 NOTE — ED Provider Notes (Signed)
[facility]    CSN: [id number] Arrival date & time: 01/21/22  0856      History   Chief Complaint Chief Complaint  Patient presents with   Back Pain    HPI Ann Newton is a 67 y.o. female.   67 year old female presents with chest wall pain.  The patient indicates for the past 2 weeks she has been having bilateral lower lateral rib discomfort and pain.  Patient indicates that the pain is worse when she moves, turns, bends, or breathes deep.  Patient indicates that she has been seen 2 weeks ago and evaluated and had a chest x-ray which was normal.  She indicates that she has been taking Advil OTC, Naprosyn, and Tylenol without relief from her discomfort.  Patient indicates over the past 2 days the pain is became significantly worse, with severe pain when she breathes deep, moves or turns, or lies flat and tries to roll over in bed.  Patient indicates she has not finding a position that is comfortable.  She indicates she is not having any fever, chills, wheezing, shortness of breath.  She relates she has not have any nausea or vomiting.  Patient indicates that she does not smoke.  She also indicates that she has been drinking fluids and eating normally.  She relates that she has had a normal bowel movement although she did experience some mild constipation 2 days ago when she took a mild laxative and this resolved.  She is concerned about the degree of pain as she describes it as a 10 on a scale of 1-10.   Back Pain   Past Medical History:  Diagnosis Date   Arthritis    Hyperlipidemia    diet controlled   Psoriasis    Thyroid disease    Wears contact lenses     Patient Active Problem List   Diagnosis Date Noted   Atopic dermatitis 07/26/2021   Elevated blood pressure reading 05/02/2021   Acute non-recurrent pansinusitis 04/07/2021   Weight loss 02/25/2021   Fever blister 02/04/2021   Edema 02/04/2021   Thyroid nodule greater than or equal to 1 cm in diameter  incidentally noted on imaging study 11/20/2018   Fatigue 11/09/2018   Elevated glucose 11/09/2018   Snores 11/09/2018   Hair loss 11/09/2018   Need for influenza vaccination 11/09/2018   Thyromegaly 11/09/2018   Allergic reaction 05/17/2018   Maculopapular rash 05/17/2018   Family history of rheumatoid arthritis 03/09/2018   History of vitamin D deficiency 03/09/2018   Primary osteoarthritis of both knees 02/22/2018   Primary osteoarthritis of both hands 02/22/2018   Neuropathy 11/22/2017   Chronic pain of both knees 11/22/2017   Need for shingles vaccine 11/22/2017   Vitamin D deficiency 01/24/2016   Smoking hx- ( less 1 ppd * 15 yrs) - Quit Jan 17 2013 12/28/2015   Elevated LDL cholesterol level 12/28/2015   Elevated vitamin B12 level 12/28/2015   h/o Mild peripheral edema- takes HCTZ for this 12/20/2015   Dietary B12 deficiency 12/20/2015   History of chickenpox ( Aug '17 )  12/20/2015   Healthcare maintenance 12/20/2015   Body mass index 28.0-28.9, adult 12/03/2015   h/o Hypertriglyceridemia 12/03/2015   Mixed hyperlipidemia 12/03/2015   Pustular psoriasis of palms and soles 10/28/2010    Past Surgical History:  Procedure Laterality Date   ABDOMINAL HYSTERECTOMY  2000   part   ANTERIOR AND POSTERIOR REPAIR N/A 09/08/2016   Procedure: ANTERIOR (CYSTOCELE) AND POSTERIOR REPAIR (RECTOCELE)/Perineorrhaphy;  Surgeon: Brien Few, MD;  Location: [facility];  Service: Gynecology;  Laterality: N/A;  Requests 17min. Repair of enterocele/posterior   COLONOSCOPY     ~12 yrs ago- normal per pt    COSMETIC SURGERY  1999   breast implants   HAGLAND'S DEFORMITY EXCISION Left 09/13/2013   Procedure: LEFT FOOT: EXCISION INTERDIGITAL MORTON'S NEUROMA SINGLE EACH;  Surgeon: Yvette Rack., MD;  Location: [place] [facility];  Service: Orthopedics;  Laterality: Left;   KNEE ARTHROSCOPY  2013,2011   right/left   SHOULDER ARTHROSCOPY     rt   TUBAL LIGATION     WISDOM TOOTH  EXTRACTION      OB History   No obstetric history on file.      Home Medications    Prior to Admission medications   Medication Sig Start Date End Date Taking? Authorizing Provider  acyclovir (ZOVIRAX) 400 MG tablet Take 1 tablet po TID for 5 days. Begin taking at first sign of fever blister 01/28/21  Yes Boscia, Heather E, NP  calcipotriene (DOVONOX) 0.005 % ointment APPLY TO AFFECTED AREA ON THE SKIN DAILY FOR 2 WEEKS. 12/01/21  Yes Ofilia Neas, PA-C  clobetasol ointment (TEMOVATE) 0.05 % Apply to affected area up to two times daily for up to two weeks. 12/01/21  Yes Ofilia Neas, PA-C  diclofenac sodium (VOLTAREN) 1 % GEL 3 grams to 3 large joints up to 3 times daily 03/16/18  Yes Deveshwar, Abel Presto, MD  EFUDEX 5 % cream Apply topically. 12/03/20  Yes [provider]  hydrochlorothiazide (HYDRODIURIL) 12.5 MG tablet Take 1 tablet (12.5 mg total) by mouth daily. 01/28/21  Yes Boscia, Greer Ee, NP  HYDROcodone-acetaminophen (NORCO/VICODIN) 5-325 MG tablet Take 2 tablets by mouth every 4 (four) hours as needed. 01/21/22  Yes Nyoka Lint, PA-C  ibuprofen (ADVIL) 600 MG tablet Take 1 tablet (600 mg total) by mouth every 6 (six) hours as needed. 01/21/22  Yes Nyoka Lint, PA-C  meloxicam (MOBIC) 7.5 MG tablet Take 7.5 mg by mouth as needed.   Yes [provider]  Multiple Vitamins-Minerals (CENTRUM SILVER 50+WOMEN) TABS Take 1 tablet by mouth daily.   Yes [provider]  naproxen (NAPROSYN) 375 MG tablet Take 1 tablet (375 mg total) by mouth 2 (two) times daily with a meal. 12/30/21  Yes Jaynee Eagles, PA-C  phentermine (ADIPEX-P) 37.5 MG tablet Take 1 tablet (37.5 mg total) by mouth daily before breakfast. 07/26/21  Yes Boscia, Heather E, NP  predniSONE (DELTASONE) 10 MG tablet Take 1 tablet (10 mg total) by mouth in the morning, at noon, and at bedtime. 01/21/22  Yes Nyoka Lint, PA-C  Risankizumab-rzaa [facility] PEN) 150 MG/ML SOAJ Inject 150mg  into the skin at Week 4  then every 12 weeks (Week 0 received in clinic on 12/30/21) 12/30/21  Yes Deveshwar, Abel Presto, MD  tiZANidine (ZANAFLEX) 4 MG tablet Take 1 tablet (4 mg total) by mouth at bedtime. 12/30/21  Yes Jaynee Eagles, PA-C    Family History Family History  Problem Relation Age of Onset   Kidney disease Mother    Diabetes Mother    Heart attack Father    Rheum arthritis Brother    Healthy Daughter    Healthy Son    Heart attack Maternal Grandmother    Heart attack Maternal Grandfather    Cancer Paternal Grandmother    Heart attack Paternal Grandfather    Rheum arthritis Brother    Colon cancer Neg Hx    Colon polyps  Neg Hx    Esophageal cancer Neg Hx    Rectal cancer Neg Hx    Stomach cancer Neg Hx     Social History Social History   Tobacco Use   Smoking status: Former    Packs/day: 0.50    Years: 10.00    Total pack years: 5.00    Types: Cigarettes    Quit date: 09/12/2012    Years since quitting: 9.3   Smokeless tobacco: Never  Vaping Use   Vaping Use: Never used  Substance Use Topics   Alcohol use: No   Drug use: No     Allergies   Patient has no known allergies.   Review of Systems Review of Systems  Musculoskeletal:  Positive for back pain (pain bilat lower costal margins chest that is worse with breathing and movement).     Physical Exam Triage Vital Signs ED Triage Vitals  Enc Vitals Group     BP 01/21/22 0911 (!) 125/54     Pulse Rate 01/21/22 0911 (!) 103     Resp 01/21/22 0911 18     Temp 01/21/22 0911 98.1 F (36.7 C)     Temp Source 01/21/22 0911 Oral     SpO2 01/21/22 0911 98 %     Weight 01/21/22 0913 180 lb (81.6 kg)     Height 01/21/22 0913 5\' 8"  (1.727 m)     Head Circumference --      Peak Flow --      Pain Score 01/21/22 0913 9     Pain Loc --      Pain Edu? --      Excl. in [place]? --    No data found.  Updated Vital Signs BP (!) 125/54 (BP Location: Left Arm)   Pulse (!) 103   Temp 98.1 F (36.7 C) (Oral)   Resp 18   Ht 5\' 8"   (1.727 m)   Wt 180 lb (81.6 kg)   SpO2 98%   BMI 27.37 kg/m   Visual Acuity Right Eye Distance:   Left Eye Distance:   Bilateral Distance:    Right Eye Near:   Left Eye Near:    Bilateral Near:     Physical Exam Constitutional:      Appearance: Normal appearance.  HENT:     Mouth/Throat:     Mouth: Mucous membranes are moist.     Pharynx: Oropharynx is clear. No posterior oropharyngeal erythema.  Cardiovascular:     Rate and Rhythm: Normal rate and regular rhythm.     Heart sounds: Normal heart sounds.  Pulmonary:     Effort: Pulmonary effort is normal.     Breath sounds: Normal breath sounds and air entry. No wheezing, rhonchi or rales.  Chest:       Comments: Chest wall: Pain is palpated along the lateral lower costal margins of the chest wall with the right being worse than the left.  There is no unusual rash on the skin, redness, or swelling.  There is no crepitus with palpation. Abdominal:     General: Abdomen is flat. Bowel sounds are normal.     Palpations: Abdomen is soft.     Tenderness: There is no abdominal tenderness. There is no guarding or rebound.  Lymphadenopathy:     Cervical: No cervical adenopathy.  Neurological:     Mental Status: She is alert.      UC Treatments / Results  Labs (all labs ordered are listed, but only abnormal results are  displayed) Labs Reviewed - No data to display  EKG   Radiology DG Chest 2 View  Result Date: 01/21/2022 CLINICAL DATA:  2 weeks chest wall pain lower later costal margin, pain worse with breathing, movement. No hx trauma or recent URI. EXAM: CHEST - 2 VIEW COMPARISON:  12/30/2021 FINDINGS: Lungs clear, mildly hyperinflated. Heart size and mediastinal contours are within normal limits. Aortic Atherosclerosis (ICD10-170.0). No effusion. Old left seventh rib fracture. IMPRESSION: No acute cardiopulmonary disease. Electronically Signed   By: Lucrezia Europe M.D.   On: 01/21/2022 09:47    Procedures Procedures  (including critical care time)  Medications Ordered in UC Medications - No data to display  Initial Impression / Assessment and Plan / UC Course  I have reviewed the triage vital signs and the nursing notes.  Pertinent labs & imaging results that were available during my care of the patient were reviewed by me and considered in my medical decision making (see chart for details).    Plan: 1.  The chest wall pain will be treated with the following: A.  Vicodin tablets, 1-2 every 6-8 hours as needed for pain relief.  (Patient advised to use with caution as this does cause drowsiness.) B.  Ibuprofen 600 mg every 8 hour basis with food to help decrease pain relief over the next several days. 2.  The chondritis will be treated with the following: A.  Prednisone 10 mg, every 8 hours for 5 days only to help reduce the acute inflammatory process. Patient advised to follow-up PCP or return if symptoms fail to improve over the next week. Final Clinical Impressions(s) / UC Diagnoses   Final diagnoses:  Chest wall pain  Chondritis     Discharge Instructions      Advised to take the Vicodin tablets, 1-2 every 6-8 hours as needed for acute pain relief.  (Be cautious with this medication as it does cause drowsiness and constipation so it is advised to use a stool softener/laxative when taking this medicine)  Advised take ibuprofen 600 mg every 8 hours on a regular basis with food to help decrease the pain. Advised take prednisone 10 mg, 1 every 8 hours for 5 days to help reduce the acute inflammatory process.  Advised follow-up PCP or return to urgent care if symptoms fail to improve.     ED Prescriptions     Medication Sig Dispense Auth. Provider   ibuprofen (ADVIL) 600 MG tablet Take 1 tablet (600 mg total) by mouth every 6 (six) hours as needed. 30 tablet Nyoka Lint, PA-C   predniSONE (DELTASONE) 10 MG tablet Take 1 tablet (10 mg total) by mouth in the morning, at noon, and at  bedtime. 15 tablet Nyoka Lint, PA-C   HYDROcodone-acetaminophen (NORCO/VICODIN) 5-325 MG tablet Take 2 tablets by mouth every 4 (four) hours as needed. 18 tablet Nyoka Lint, PA-C      I have reviewed the PDMP during this encounter.   Nyoka Lint, PA-C 01/21/22 [phone]

## 2022-01-21 NOTE — Progress Notes (Signed)
Office Visit Note  Patient: Ann Newton             Date of Birth: 1955/12/07           MRN: [id number]             PCP: Carlean Jews, NP Referring: Carlean Jews, NP Visit Date: 02/03/2022 Occupation: @GUAROCC @  Subjective:  Medication monitoring  History of Present Illness: Ann Newton is a 67 y.o. female with history of psoriatic arthritis and osteoarthritis.  Patient was initiated on Skyrizi on 12/30/2021.  She had an inadequate response to Cosentyx-initiated 10/06/2021-last dose administered on 12/01/2021.  Patient previously tried Tremfya from 08/03/2020 through 08/10/2021.  Patient had her second dose of Skyrizi this past weekend.  She has been tolerating Skyrizi without any side effects or injection site reactions.  She has noticed about an 80% improvement in her skin clearance since initiating Skyrizi. She continues to use clobetasol one to two times daily. She denies any Achilles tendinitis or plantar fasciitis at this time.  She denies any SI joint discomfort.  She denies any joint swelling at this time.  Patient reports that she was evaluated at urgent care on 12/30/2021, 01/21/2021, and 01/29/2021 for chest wall pain especially on her lower rib cage bilaterally.  She has tried prednisone, 2 oral NSAIDs, Toradol IM injection, Norco, tizanidine with minimal to no relief.  She has also tried topical agents as well as a heating pad.  She has been experiencing nocturnal symptoms.  She denies any cough, shortness of breath, pleuritic chest pain, fever, or recent infection.  She had x-rays of the ribs as well as a chest x-ray which were unremarkable for any acute abnormalities.  She was evaluated at orthopedic urgent care and has an upcoming appointment tomorrow with her orthopedist for further evaluation and management.   Activities of Daily Living:  Patient reports morning stiffness for 1 hour.   Patient Reports nocturnal pain.  Difficulty dressing/grooming:  Denies Difficulty climbing stairs: Denies Difficulty getting out of chair: Denies Difficulty using hands for taps, buttons, cutlery, and/or writing: Denies  Review of Systems  Constitutional:  Negative for fatigue.  HENT:  Negative for mouth sores and mouth dryness.   Eyes:  Negative for dryness.  Respiratory:  Negative for shortness of breath.   Cardiovascular:  Negative for chest pain and palpitations.  Gastrointestinal:  Negative for blood in stool, constipation and diarrhea.  Endocrine: Negative for increased urination.  Genitourinary:  Negative for involuntary urination.  Musculoskeletal:  Positive for joint pain, joint pain and morning stiffness. Negative for gait problem, joint swelling, myalgias, muscle weakness, muscle tenderness and myalgias.  Skin:  Positive for rash. Negative for color change, hair loss and sensitivity to sunlight.  Allergic/Immunologic: Negative for susceptible to infections.  Neurological:  Negative for dizziness and headaches.  Hematological:  Negative for swollen glands.  Psychiatric/Behavioral:  Negative for depressed mood and sleep disturbance. The patient is not nervous/anxious.     PMFS History:  Patient Active Problem List   Diagnosis Date Noted   Atopic dermatitis 07/26/2021   Elevated blood pressure reading 05/02/2021   Acute non-recurrent pansinusitis 04/07/2021   Weight loss 02/25/2021   Fever blister 02/04/2021   Edema 02/04/2021   Thyroid nodule greater than or equal to 1 cm in diameter incidentally noted on imaging study 11/20/2018   Fatigue 11/09/2018   Elevated glucose 11/09/2018   Snores 11/09/2018   Hair loss 11/09/2018   Need for influenza vaccination  11/09/2018   Thyromegaly 11/09/2018   Allergic reaction 05/17/2018   Maculopapular rash 05/17/2018   Family history of rheumatoid arthritis 03/09/2018   History of vitamin D deficiency 03/09/2018   Primary osteoarthritis of both knees 02/22/2018   Primary osteoarthritis of both  hands 02/22/2018   Neuropathy 11/22/2017   Chronic pain of both knees 11/22/2017   Need for shingles vaccine 11/22/2017   Vitamin D deficiency 01/24/2016   Smoking hx- ( less 1 ppd * 15 yrs) - Quit Jan 17 2013 12/28/2015   Elevated LDL cholesterol level 12/28/2015   Elevated vitamin B12 level 12/28/2015   h/o Mild peripheral edema- takes HCTZ for this 12/20/2015   Dietary B12 deficiency 12/20/2015   History of chickenpox ( Aug '17 )  12/20/2015   Healthcare maintenance 12/20/2015   Body mass index 28.0-28.9, adult 12/03/2015   h/o Hypertriglyceridemia 12/03/2015   Mixed hyperlipidemia 12/03/2015   Pustular psoriasis of palms and soles 10/28/2010    Past Medical History:  Diagnosis Date   Arthritis    Hyperlipidemia    diet controlled   Psoriasis    Thyroid disease    Wears contact lenses     Family History  Problem Relation Age of Onset   Kidney disease Mother    Diabetes Mother    Heart attack Father    Rheum arthritis Brother    Healthy Daughter    Healthy Son    Heart attack Maternal Grandmother    Heart attack Maternal Grandfather    Cancer Paternal Grandmother    Heart attack Paternal Grandfather    Rheum arthritis Brother    Colon cancer Neg Hx    Colon polyps Neg Hx    Esophageal cancer Neg Hx    Rectal cancer Neg Hx    Stomach cancer Neg Hx    Past Surgical History:  Procedure Laterality Date   ABDOMINAL HYSTERECTOMY  2000   part   ANTERIOR AND POSTERIOR REPAIR N/A 09/08/2016   Procedure: ANTERIOR (CYSTOCELE) AND POSTERIOR REPAIR (RECTOCELE)/Perineorrhaphy;  Surgeon: Brien Few, MD;  Location: [facility];  Service: Gynecology;  Laterality: N/A;  Requests 37min. Repair of enterocele/posterior   COLONOSCOPY     ~12 yrs ago- normal per pt    COSMETIC SURGERY  1999   breast implants   HAGLAND'S DEFORMITY EXCISION Left 09/13/2013   Procedure: LEFT FOOT: EXCISION INTERDIGITAL MORTON'S NEUROMA SINGLE EACH;  Surgeon: Yvette Rack., MD;  Location: [place];  Service: Orthopedics;  Laterality: Left;   KNEE ARTHROSCOPY  2013,2011   right/left   SHOULDER ARTHROSCOPY     rt   TUBAL LIGATION     WISDOM TOOTH EXTRACTION     Social History   Social History Narrative   Right handed    Immunization History  Administered Date(s) Administered   Influenza,inj,Quad PF,6+ Mos 12/03/2015, 11/22/2017, 11/09/2018, 12/11/2019, 02/02/2021   Moderna Sars-Covid-2 Vaccination 08/13/2019, 08/19/2019, 09/16/2019   Pneumococcal Polysaccharide-23 11/22/2017   Tdap 02/09/2009, 05/29/2019   Zoster Recombinat (Shingrix) 11/09/2018, 05/29/2019     Objective: Vital Signs: BP 113/74 (BP Location: Left Arm, Patient Position: Sitting, Cuff Size: Normal)   Pulse (!) 106   Resp 14   Ht 5\' 8"  (1.727 m)   Wt 184 lb (83.5 kg)   BMI 27.98 kg/m    Physical Exam Vitals and nursing note reviewed.  Constitutional:      Appearance: She is well-developed.  HENT:     Head: Normocephalic and atraumatic.  Eyes:  Conjunctiva/sclera: Conjunctivae normal.  Cardiovascular:     Rate and Rhythm: Normal rate and regular rhythm.     Heart sounds: Normal heart sounds.  Pulmonary:     Effort: Pulmonary effort is normal.     Breath sounds: Normal breath sounds.  Abdominal:     General: Bowel sounds are normal.     Palpations: Abdomen is soft.  Musculoskeletal:     Cervical back: Normal range of motion.  Skin:    General: Skin is warm and dry.     Capillary Refill: Capillary refill takes less than 2 seconds.  Neurological:     Mental Status: She is alert and oriented to person, place, and time.  Psychiatric:        Behavior: Behavior normal.      Musculoskeletal Exam: C-spine, thoracic spine, and lumbar spine good ROM.  No midline spinal tenderness.  No SI joint tenderness.  Tenderness to palpation over bilateral lower ribs-midline to axillary region.Marland Kitchen  No CVA tenderness.  No skin lesions or ecchymosis noted.  Shoulder joints, elbow joints, wrist  joints, Mcps, PIPs, and DIPs good ROM with no synovitis.  Complete fist formation bilaterally.  Hip joints, knee joints, and ankle joints have good ROM with no discomfort.  No warmth or effusion of knee joints.  No evidence of achilles tendonitis or plantar fasciitis.   CDAI Exam: CDAI Score: -- Patient Global: --; Provider Global: -- Swollen: --; Tender: -- Joint Exam 02/03/2022   No joint exam has been documented for this visit   There is currently no information documented on the homunculus. Go to the Rheumatology activity and complete the homunculus joint exam.  Investigation: No additional findings.  Imaging: DG Chest 2 View  Result Date: 01/21/2022 CLINICAL DATA:  2 weeks chest wall pain lower later costal margin, pain worse with breathing, movement. No hx trauma or recent URI. EXAM: CHEST - 2 VIEW COMPARISON:  12/30/2021 FINDINGS: Lungs clear, mildly hyperinflated. Heart size and mediastinal contours are within normal limits. Aortic Atherosclerosis (ICD10-170.0). No effusion. Old left seventh rib fracture. IMPRESSION: No acute cardiopulmonary disease. Electronically Signed   By: Lucrezia Europe M.D.   On: 01/21/2022 09:47    Recent Labs: Lab Results  Component Value Date   WBC 9.4 12/01/2021   HGB 13.3 12/01/2021   PLT 379 12/01/2021   NA 140 12/01/2021   K 4.4 12/01/2021   CL 104 12/01/2021   CO2 31 12/01/2021   GLUCOSE 106 (H) 12/01/2021   BUN 18 12/01/2021   CREATININE 0.83 12/01/2021   BILITOT 0.3 12/01/2021   ALKPHOS 141 (H) 01/28/2021   AST 27 12/01/2021   ALT 35 (H) 12/01/2021   PROT 6.4 12/01/2021   ALBUMIN 4.5 01/28/2021   CALCIUM 8.9 12/01/2021   GFRAA 92 07/09/2020   QFTBGOLDPLUS NEGATIVE 09/08/2021    Speciality Comments: Otezla stopped 08/01/20 Tremfya started 08/03/20 - stopped 08/10/21 Cosentyx started 10/06/21- stopped 12/01/21; Skyrizi started 12/30/21  Procedures:  No procedures performed Allergies: Patient has no known allergies.   Assessment /  Plan:     Visit Diagnoses: Psoriatic arthropathy ([place]): She has no synovitis or dactylitis on examination today.  No evidence of Achilles tendinitis or plantar fasciitis.  No SI joint tenderness.  She has noticed about an 80% improvement in her skin clearance since initiating Skyrizi on 12/30/2021.  She continues to use clobetasol cream once or twice daily on the plantar aspect of her feet.  She has been tolerating Skyrizi without any side effects, injection  site reactions, or recurrent infections. She will remain on Skyrizi as prescribed.  She was asked to notify us if she develops signs or symptoms of a flare.  She will follow-up in the office in 2 months for [name] to reassess her response.  Pustular psoriasis of palms and soles - History of pustular psoriasis on palms-resolved on Tremfya July 2022. Recurrence on plantar aspect of both feet since switching from Tremfya to Cosentyx.  Patient was initiated on Skyrizi on 12/30/2021.  80% improvement in skin clearance on the plantar aspect of both feet.  She continues to use clobetasol cream topically one to two times daily.   High risk medication use - Initiated on Silvis on 12/30/2021.  She had an inadequate response to Cosentyx-initiated 10/06/2021-last dose administered on 12/01/2021.  Patient previously tried Tremfya from 08/03/2020 through 08/10/2021. CBC and CMP updated on 12/01/21.  Orders for CBC and CMP released today.  TB gold negative 09/08/21.  Discussed the importance of holding cosentyx if she develops signs or symptoms of an infection and to resume once the infection has completely cleared.   Plan: CBC with Differential/Platelet, COMPLETE METABOLIC PANEL WITH GFR  Primary osteoarthritis of both hands: No tenderness or synovitis noted.  Complete fist formation bilaterally.  Primary osteoarthritis of both hips: Good ROM of both hip joints with no groin pain.   Primary osteoarthritis of both knees: Good ROM of both knee joints.  No warmth or  effusion noted.   Paresthesia of both feet: Asymptomatic currently.   Costochondritis -Evaluated at urgent care on 12/30/2021, 01/21/2021, and 01/29/2021.  She has been diagnosed with costochondritis which has been refractory to NSAID use.  She continues to have a dull ache in her lower ribs and occasionally has a sharp stabbing pain.  She has not had any recent infection, cough, pleuritic chest pain, or a fever.   Patient has been treated with oral prednisone, hydrocodone, tizanidine, meloxicam, and ibuprofen with minimal to no improvement.  She has also tried topical agents and a heating pad.  She was evaluated by orthopedic urgent care and has a follow-up visit with her orthopedist tomorrow for further evaluation and management.  Urgent referral to integrative therapy was placed today.  Plan: Ambulatory referral to Physical Therapy  Other medical conditions are listed as follows:   Family history of rheumatoid arthritis  History of vitamin D deficiency  h/o Hypertriglyceridemia  Smoking hx- ( less 1 ppd * 15 yrs) - Quit Jan 17 2013  Neuropathy    Orders: Orders Placed This Encounter  Procedures   CBC with Differential/Platelet   COMPLETE METABOLIC PANEL WITH GFR   Ambulatory referral to Physical Therapy   Meds ordered this encounter  Medications   tiZANidine (ZANAFLEX) 4 MG tablet    Sig: Take 1 tablet (4 mg total) by mouth at bedtime as needed for muscle spasms.    Dispense:  30 tablet    Refill:  0     Follow-Up Instructions: Return in about 3 months (around 05/05/2022) for Psoriatic arthritis, Osteoarthritis.   Gearldine Bienenstock, PA-C  Note - This record has been created using Dragon software.  Chart creation errors have been sought, but may not always  have been located. Such creation errors do not reflect on  the standard of medical care.

## 2022-01-21 NOTE — ED Triage Notes (Signed)
Patient c/o back pain that radiates to both sides under her ribs x 2 days.  No injury to the area.  The pain feels like someone is squeezing her.  Patient has taken Naproxen, Advil or Aleve.

## 2022-01-29 ENCOUNTER — Ambulatory Visit
Admission: EM | Admit: 2022-01-29 | Discharge: 2022-01-29 | Disposition: A | Discharge status: Home / Self Care | Payer: 59 | Attending: Internal Medicine | Admitting: Internal Medicine

## 2022-01-29 DIAGNOSIS — R0781 Pleurodynia: Secondary | ICD-10-CM | POA: Diagnosis not present

## 2022-01-29 MED ORDER — KETOROLAC TROMETHAMINE 30 MG/ML IJ SOLN
30.0000 mg | Freq: Once | INTRAMUSCULAR | Status: AC
  Administered 2022-01-29: 30 mg via INTRAMUSCULAR

## 2022-01-29 NOTE — ED Provider Notes (Signed)
[facility]    CSN: [id number] Arrival date & time: 01/29/22  2376      History   Chief Complaint No chief complaint on file.   HPI Ann Newton is a 67 y.o. female.   Patient presents with persistent bilateral rib pain.  She has been seen twice at previous urgent care visits for the same symptoms.  She was seen originally on 12/30/21 and had left rib x-ray.  It showed an old healed rib fracture but no acute abnormalities.  She was prescribed a muscle relaxer and naproxen with minimal improvement.  She came back on 01/21/22 for persistent symptoms.  A 2 view chest x-ray was completed that showed the old rib fracture again but no other acute abnormalities.  She was then prescribed Norco, ibuprofen, prednisone with minimal improvement.  She reports these medications gave her temporary resolution of pain but have not been very helpful.  She denies any obvious injury other than bending over to fix her hair and then when she came back up she felt the sharp pain in her left side.  She reports the pain has moved over to the right side.  She denies any coughing, nausea, vomiting, diarrhea, constipation.  Having normal bowel movements.  Denies blood in stool.  Denies any associated fever.     Past Medical History:  Diagnosis Date   Arthritis    Hyperlipidemia    diet controlled   Psoriasis    Thyroid disease    Wears contact lenses     Patient Active Problem List   Diagnosis Date Noted   Atopic dermatitis 07/26/2021   Elevated blood pressure reading 05/02/2021   Acute non-recurrent pansinusitis 04/07/2021   Weight loss 02/25/2021   Fever blister 02/04/2021   Edema 02/04/2021   Thyroid nodule greater than or equal to 1 cm in diameter incidentally noted on imaging study 11/20/2018   Fatigue 11/09/2018   Elevated glucose 11/09/2018   Snores 11/09/2018   Hair loss 11/09/2018   Need for influenza vaccination 11/09/2018   Thyromegaly 11/09/2018   Allergic reaction  05/17/2018   Maculopapular rash 05/17/2018   Family history of rheumatoid arthritis 03/09/2018   History of vitamin D deficiency 03/09/2018   Primary osteoarthritis of both knees 02/22/2018   Primary osteoarthritis of both hands 02/22/2018   Neuropathy 11/22/2017   Chronic pain of both knees 11/22/2017   Need for shingles vaccine 11/22/2017   Vitamin D deficiency 01/24/2016   Smoking hx- ( less 1 ppd * 15 yrs) - Quit Jan 17 2013 12/28/2015   Elevated LDL cholesterol level 12/28/2015   Elevated vitamin B12 level 12/28/2015   h/o Mild peripheral edema- takes HCTZ for this 12/20/2015   Dietary B12 deficiency 12/20/2015   History of chickenpox ( Aug '17 )  12/20/2015   Healthcare maintenance 12/20/2015   Body mass index 28.0-28.9, adult 12/03/2015   h/o Hypertriglyceridemia 12/03/2015   Mixed hyperlipidemia 12/03/2015   Pustular psoriasis of palms and soles 10/28/2010    Past Surgical History:  Procedure Laterality Date   ABDOMINAL HYSTERECTOMY  2000   part   ANTERIOR AND POSTERIOR REPAIR N/A 09/08/2016   Procedure: ANTERIOR (CYSTOCELE) AND POSTERIOR REPAIR (RECTOCELE)/Perineorrhaphy;  Surgeon: Olivia Mackie, MD;  Location: [facility];  Service: Gynecology;  Laterality: N/A;  Requests . Repair of enterocele/posterior   COLONOSCOPY     ~12 yrs ago- normal per pt    COSMETIC SURGERY  1999   breast implants   HAGLAND'S DEFORMITY EXCISION Left 09/13/2013  Procedure: LEFT FOOT: EXCISION INTERDIGITAL MORTON'S NEUROMA SINGLE EACH;  Surgeon: Yvette Rack., MD;  Location: [place];  Service: Orthopedics;  Laterality: Left;   KNEE ARTHROSCOPY  2013,2011   right/left   SHOULDER ARTHROSCOPY     rt   TUBAL LIGATION     WISDOM TOOTH EXTRACTION      OB History   No obstetric history on file.      Home Medications    Prior to Admission medications   Medication Sig Start Date End Date Taking? Authorizing Provider  acyclovir (ZOVIRAX) 400 MG tablet Take 1 tablet po  TID for 5 days. Begin taking at first sign of fever blister 01/28/21   Leretha Pol E, NP  calcipotriene (DOVONOX) 0.005 % ointment APPLY TO AFFECTED AREA ON THE SKIN DAILY FOR 2 WEEKS. 12/01/21   Ofilia Neas, PA-C  clobetasol ointment (TEMOVATE) 0.05 % Apply to affected area up to two times daily for up to two weeks. 12/01/21   Ofilia Neas, PA-C  diclofenac sodium (VOLTAREN) 1 % GEL 3 grams to 3 large joints up to 3 times daily 03/16/18   Bo Merino, MD  EFUDEX 5 % cream Apply topically. 12/03/20   [provider]  hydrochlorothiazide (HYDRODIURIL) 12.5 MG tablet Take 1 tablet (12.5 mg total) by mouth daily. 01/28/21   Ronnell Freshwater, NP  HYDROcodone-acetaminophen (NORCO/VICODIN) 5-325 MG tablet Take 2 tablets by mouth every 4 (four) hours as needed. 01/21/22   Nyoka Lint, PA-C  ibuprofen (ADVIL) 600 MG tablet Take 1 tablet (600 mg total) by mouth every 6 (six) hours as needed. 01/21/22   Nyoka Lint, PA-C  meloxicam (MOBIC) 7.5 MG tablet Take 7.5 mg by mouth as needed.    [provider]  Multiple Vitamins-Minerals (CENTRUM SILVER 50+WOMEN) TABS Take 1 tablet by mouth daily.    [provider]  naproxen (NAPROSYN) 375 MG tablet Take 1 tablet (375 mg total) by mouth 2 (two) times daily with a meal. 12/30/21   Jaynee Eagles, PA-C  phentermine (ADIPEX-P) 37.5 MG tablet Take 1 tablet (37.5 mg total) by mouth daily before breakfast. 07/26/21   Ronnell Freshwater, NP  predniSONE (DELTASONE) 10 MG tablet Take 1 tablet (10 mg total) by mouth in the morning, at noon, and at bedtime. 01/21/22   Nyoka Lint, PA-C  Risankizumab-rzaa [facility] PEN) 150 MG/ML SOAJ Inject 150mg  into the skin at Week 4 then every 12 weeks (Week 0 received in clinic on 12/30/21) 12/30/21   Bo Merino, MD  tiZANidine (ZANAFLEX) 4 MG tablet Take 1 tablet (4 mg total) by mouth at bedtime. 12/30/21   Jaynee Eagles, PA-C    Family History Family History  Problem Relation Age of Onset   Kidney  disease Mother    Diabetes Mother    Heart attack Father    Rheum arthritis Brother    Healthy Daughter    Healthy Son    Heart attack Maternal Grandmother    Heart attack Maternal Grandfather    Cancer Paternal Grandmother    Heart attack Paternal Grandfather    Rheum arthritis Brother    Colon cancer Neg Hx    Colon polyps Neg Hx    Esophageal cancer Neg Hx    Rectal cancer Neg Hx    Stomach cancer Neg Hx     Social History Social History   Tobacco Use   Smoking status: Former    Packs/day: 0.50    Years: 10.00  Total pack years: 5.00    Types: Cigarettes    Quit date: 09/12/2012    Years since quitting: 9.3   Smokeless tobacco: Never  Vaping Use   Vaping Use: Never used  Substance Use Topics   Alcohol use: No   Drug use: No     Allergies   Patient has no known allergies.   Review of Systems Review of Systems Per HPI  Physical Exam Triage Vital Signs ED Triage Vitals  Enc Vitals Group     BP 01/29/22 0819 (!) 147/81     Pulse Rate 01/29/22 0819 (!) 103     Resp 01/29/22 0819 19     Temp 01/29/22 0819 98.7 F (37.1 C)     Temp Source 01/29/22 0819 Oral     SpO2 01/29/22 0819 97 %     Weight --      Height --      Head Circumference --      Peak Flow --      Pain Score 01/29/22 0818 8     Pain Loc --      Pain Edu? --      Excl. in [place]? --    No data found.  Updated Vital Signs BP (!) 147/81 (BP Location: Left Arm)   Pulse (!) 103   Temp 98.7 F (37.1 C) (Oral)   Resp 19   SpO2 97%   Visual Acuity Right Eye Distance:   Left Eye Distance:   Bilateral Distance:    Right Eye Near:   Left Eye Near:    Bilateral Near:     Physical Exam Constitutional:      General: She is not in acute distress.    Appearance: Normal appearance. She is not toxic-appearing or diaphoretic.  HENT:     Head: Normocephalic and atraumatic.  Eyes:     Extraocular Movements: Extraocular movements intact.     Conjunctiva/sclera: Conjunctivae normal.   Cardiovascular:     Rate and Rhythm: Normal rate and regular rhythm.     Pulses: Normal pulses.     Heart sounds: Normal heart sounds.  Pulmonary:     Effort: Pulmonary effort is normal. No respiratory distress.     Breath sounds: Normal breath sounds.  Chest:       Comments: Tenderness to palpation to bilateral lower ribs.  No obvious swelling, discoloration, lacerations, abrasions noted.  Patient also has tenderness to left lower posterior ribs bilaterally with no obvious swelling, discoloration, lacerations, abrasions noted. Abdominal:     General: Bowel sounds are normal. There is no distension.     Palpations: Abdomen is soft.     Tenderness: There is no abdominal tenderness.  Neurological:     General: No focal deficit present.     Mental Status: She is alert and oriented to person, place, and time. Mental status is at baseline.  Psychiatric:        Mood and Affect: Mood normal.        Behavior: Behavior normal.        Thought Content: Thought content normal.        Judgment: Judgment normal.      UC Treatments / Results  Labs (all labs ordered are listed, but only abnormal results are displayed) Labs Reviewed - No data to display  EKG   Radiology No results found.  Procedures Procedures (including critical care time)  Medications Ordered in UC Medications  ketorolac (TORADOL) 30 MG/ML injection 30 mg (30 mg  Intramuscular Given 01/29/22 0911)    Initial Impression / Assessment and Plan / UC Course  I have reviewed the triage vital signs and the nursing notes.  Pertinent labs & imaging results that were available during my care of the patient were reviewed by me and considered in my medical decision making (see chart for details).     Patient appears to be having persistent bilateral rib pain.  X-rays have shown old rib fracture but unsure if this is related to patient's discomfort.  Could be muscular in nature.  Patient has been on prednisone, 2 NSAIDs,  Norco, muscle relaxer with minimal improvement.  Therefore, given symptoms have been refractory to several different types of medications, I do think that seeing a specialist would be the best next step for patient.  She reports that she has an established orthopedist so encouraged her to follow-up with them.  I also gave patient addressed and contact information for orthopedic urgent care if she feels it is necessary.  IM Toradol administered in urgent care given that patient has not taken any NSAIDs today.  Advised patient to not take any additional NSAIDs for at least 24 hours.  Patient is mildly tachycardic but suspect this is due to pain as this was present at previous urgent care visit as well.  Discussed return precautions.  Patient verbalized understanding and was agreeable with plan. Final Clinical Impressions(s) / UC Diagnoses   Final diagnoses:  Rib pain     Discharge Instructions      Given your pain has been refractory to medications, I do think that the next step is to see a specialist.  Please follow-up with your established orthopedist.  I have also given you information for an orthopedic urgent care if you feel is necessary.     ED Prescriptions   None    PDMP not reviewed this encounter.   Gustavus Bryant, [place] 01/29/22 0930

## 2022-01-29 NOTE — Discharge Instructions (Addendum)
Given your pain has been refractory to medications, I do think that the next step is to see a specialist.  Please follow-up with your established orthopedist.  I have also given you information for an orthopedic urgent care if you feel is necessary.

## 2022-01-29 NOTE — ED Triage Notes (Signed)
Pt reports being seen here recently for Chondritis. States her pains and sxs have not improved. States she still has stomach pain and is not able to sleep. Reports it has been going on for one month and has completed steroids.

## 2022-02-01 ENCOUNTER — Ambulatory Visit: Payer: 59 | Admitting: Physician Assistant

## 2022-02-03 ENCOUNTER — Ambulatory Visit: Payer: 59 | Attending: Physician Assistant | Admitting: Physician Assistant

## 2022-02-03 ENCOUNTER — Encounter: Payer: Self-pay | Admitting: Physician Assistant

## 2022-02-03 VITALS — BP 113/74 | HR 106 | Resp 14 | Ht 68.0 in | Wt 184.0 lb

## 2022-02-03 DIAGNOSIS — Z79899 Other long term (current) drug therapy: Secondary | ICD-10-CM

## 2022-02-03 DIAGNOSIS — L4050 Arthropathic psoriasis, unspecified: Secondary | ICD-10-CM | POA: Diagnosis not present

## 2022-02-03 DIAGNOSIS — G629 Polyneuropathy, unspecified: Secondary | ICD-10-CM

## 2022-02-03 DIAGNOSIS — L403 Pustulosis palmaris et plantaris: Secondary | ICD-10-CM | POA: Diagnosis not present

## 2022-02-03 DIAGNOSIS — Z8261 Family history of arthritis: Secondary | ICD-10-CM

## 2022-02-03 DIAGNOSIS — M940 Chondrocostal junction syndrome [Tietze]: Secondary | ICD-10-CM

## 2022-02-03 DIAGNOSIS — Z8639 Personal history of other endocrine, nutritional and metabolic disease: Secondary | ICD-10-CM

## 2022-02-03 DIAGNOSIS — M170 Bilateral primary osteoarthritis of knee: Secondary | ICD-10-CM

## 2022-02-03 DIAGNOSIS — M160 Bilateral primary osteoarthritis of hip: Secondary | ICD-10-CM

## 2022-02-03 DIAGNOSIS — M19042 Primary osteoarthritis, left hand: Secondary | ICD-10-CM

## 2022-02-03 DIAGNOSIS — M19041 Primary osteoarthritis, right hand: Secondary | ICD-10-CM

## 2022-02-03 DIAGNOSIS — R202 Paresthesia of skin: Secondary | ICD-10-CM

## 2022-02-03 DIAGNOSIS — E781 Pure hyperglyceridemia: Secondary | ICD-10-CM

## 2022-02-03 DIAGNOSIS — Z87891 Personal history of nicotine dependence: Secondary | ICD-10-CM

## 2022-02-03 MED ORDER — TIZANIDINE HCL 4 MG PO TABS: 4.0000 mg | ORAL_TABLET | Freq: Every evening | ORAL | 0 refills | Status: DC | PRN

## 2022-02-03 NOTE — Patient Instructions (Signed)
Standing Labs We placed an order today for your standing lab work.   Please have your standing labs drawn in April and every 3 months   Please have your labs drawn 2 weeks prior to your appointment so that the provider can discuss your lab results at your appointment.  Please note that you may see your imaging and lab results in [place] before we have reviewed them. We will contact you once all results are reviewed. Please allow our office up to 72 hours to thoroughly review all of the results before contacting the office for clarification of your results.  Lab hours are:   Monday through Thursday from 8:00 am -12:30 pm and 1:00 pm-5:00 pm and Friday from 8:00 am-12:00 pm.  Please be advised, all patients with office appointments requiring lab work will take precedent over walk-in lab work.   Labs are drawn by [facility]. Please bring your co-pay at the time of your lab draw.  You may receive a bill from [facility] for your lab work.  Please note if you are on Hydroxychloroquine and and an order has been placed for a Hydroxychloroquine level, you will need to have it drawn 4 hours or more after your last dose.  If you wish to have your labs drawn at another location, please call the office 24 hours in advance so we can fax the orders.  The office is located at [address] [place], [place], [place] [address] No appointment is necessary.    If you have any questions regarding directions or hours of operation,  please call [phone].   As a reminder, please drink plenty of water prior to coming for your lab work. Thanks!

## 2022-02-04 ENCOUNTER — Other Ambulatory Visit: Payer: Self-pay | Admitting: Family Medicine

## 2022-02-04 DIAGNOSIS — S22000A Wedge compression fracture of unspecified thoracic vertebra, initial encounter for closed fracture: Secondary | ICD-10-CM

## 2022-02-04 LAB — COMPLETE METABOLIC PANEL WITH GFR
AG Ratio: 1.4 (calc) (ref 1.0–2.5)
ALT: 22 U/L (ref 6–29)
AST: 17 U/L (ref 10–35)
Albumin: 4.2 g/dL (ref 3.6–5.1)
Alkaline phosphatase (APISO): 111 U/L (ref 37–153)
BUN: 20 mg/dL (ref 7–25)
CO2: 28 mmol/L (ref 20–32)
Calcium: 9.6 mg/dL (ref 8.6–10.4)
Chloride: 101 mmol/L (ref 98–110)
Creat: 0.88 mg/dL (ref 0.50–1.05)
Globulin: 3 g/dL (calc) (ref 1.9–3.7)
Glucose, Bld: 113 mg/dL — ABNORMAL HIGH (ref 65–99)
Potassium: 4.4 mmol/L (ref 3.5–5.3)
Sodium: 139 mmol/L (ref 135–146)
Total Bilirubin: 0.4 mg/dL (ref 0.2–1.2)
Total Protein: 7.2 g/dL (ref 6.1–8.1)
eGFR: 73 mL/min/{1.73_m2} (ref >=60)

## 2022-02-04 LAB — CBC WITH DIFFERENTIAL/PLATELET
Absolute Monocytes: 838 cells/uL (ref 200–950)
Basophils Absolute: 30 cells/uL (ref 0–200)
Basophils Relative: 0.3 %
Eosinophils Absolute: 172 cells/uL (ref 15–500)
Eosinophils Relative: 1.7 %
HCT: 40.7 % (ref 35.0–45.0)
Hemoglobin: 13.8 g/dL (ref 11.7–15.5)
Lymphs Abs: 3202 cells/uL (ref 850–3900)
MCH: 30.2 pg (ref 27.0–33.0)
MCHC: 33.9 g/dL (ref 32.0–36.0)
MCV: 89.1 fL (ref 80.0–100.0)
MPV: 10.5 fL (ref 7.5–12.5)
Monocytes Relative: 8.3 %
Neutro Abs: 5858 cells/uL (ref 1500–7800)
Neutrophils Relative %: 58 %
Platelets: 421 10*3/uL — ABNORMAL HIGH (ref 140–400)
RBC: 4.57 10*6/uL (ref 3.80–5.10)
RDW: 12.6 % (ref 11.0–15.0)
Total Lymphocyte: 31.7 %
WBC: 10.1 10*3/uL (ref 3.8–10.8)

## 2022-02-04 NOTE — Progress Notes (Signed)
Glucose is elevated-113. Rest of CMP WNL.  Plt count is slightly elevated-421.  Rest of CBC WNL.

## 2022-02-09 ENCOUNTER — Ambulatory Visit: Payer: 59 | Admitting: Physician Assistant

## 2022-02-09 ENCOUNTER — Ambulatory Visit
Admission: RE | Admit: 2022-02-09 | Discharge: 2022-02-09 | Disposition: A | Discharge status: Home / Self Care | Payer: 59 | Source: Ambulatory Visit | Attending: Family Medicine | Admitting: Family Medicine

## 2022-02-09 DIAGNOSIS — S22000A Wedge compression fracture of unspecified thoracic vertebra, initial encounter for closed fracture: Secondary | ICD-10-CM

## 2022-02-14 ENCOUNTER — Other Ambulatory Visit: Payer: Self-pay | Admitting: Neurosurgery

## 2022-02-15 ENCOUNTER — Other Ambulatory Visit: Payer: Self-pay

## 2022-02-15 ENCOUNTER — Encounter (HOSPITAL_COMMUNITY): Payer: Self-pay | Admitting: Neurosurgery

## 2022-02-15 NOTE — Progress Notes (Signed)
Ann Newton denies chest pain or shortness of breath.  Patient de  Mrs. Mohon's PCP is Leretha Pol, NP;  Rheumatologist  is with [facility].

## 2022-02-17 ENCOUNTER — Encounter (HOSPITAL_COMMUNITY): Payer: Self-pay | Admitting: Neurosurgery

## 2022-02-17 NOTE — Progress Notes (Signed)
PCP - Leretha Pol, NP Cardiologist - n/a Rheumatology - Hazel Sams, PA-C  Chest x-ray - 01/21/22 (2V) EKG -  Stress Test - n/a ECHO - n/a Cardiac Cath - 02/01/02  ICD Pacemaker/Loop - n/a  Sleep Study -  Yes, home study on 07/12/20 CPAP -  none-neg sleep study test   Diabetes - n/a  ERAS: Clear liquids til 9 AM DOS  STOP now taking any Mobic, Aspirin (unless otherwise instructed by your surgeon), Aleve, Naproxen, Ibuprofen, Motrin, Advil, Goody's, BC's, all herbal medications, fish oil, and all vitamins.   Coronavirus Screening Do you have any of the following symptoms:  Cough yes/no: No Fever (>100.37F)  yes/no: No Runny nose yes/no: No Sore throat yes/no: No Difficulty breathing/shortness of breath  yes/no: No  Have you traveled in the last 14 days and where? yes/no: No  Patient verbalized understanding of instructions that were given via phone.

## 2022-02-18 ENCOUNTER — Encounter (HOSPITAL_COMMUNITY)
Admission: RE | Disposition: A | Discharge status: Home / Self Care | Payer: Self-pay | Source: Home / Self Care | Attending: Neurosurgery

## 2022-02-18 ENCOUNTER — Ambulatory Visit (HOSPITAL_COMMUNITY)
Admission: RE | Admit: 2022-02-18 | Discharge: 2022-02-18 | Disposition: A | Discharge status: Home / Self Care | Payer: 59 | Attending: Neurosurgery | Admitting: Neurosurgery

## 2022-02-18 ENCOUNTER — Other Ambulatory Visit: Payer: Self-pay

## 2022-02-18 ENCOUNTER — Ambulatory Visit (HOSPITAL_COMMUNITY): Payer: 59

## 2022-02-18 ENCOUNTER — Ambulatory Visit (HOSPITAL_BASED_OUTPATIENT_CLINIC_OR_DEPARTMENT_OTHER): Payer: 59 | Admitting: Anesthesiology

## 2022-02-18 ENCOUNTER — Ambulatory Visit (HOSPITAL_COMMUNITY): Payer: 59 | Admitting: Anesthesiology

## 2022-02-18 ENCOUNTER — Encounter (HOSPITAL_COMMUNITY): Payer: Self-pay | Admitting: Neurosurgery

## 2022-02-18 DIAGNOSIS — M4854XA Collapsed vertebra, not elsewhere classified, thoracic region, initial encounter for fracture: Secondary | ICD-10-CM | POA: Diagnosis not present

## 2022-02-18 DIAGNOSIS — M8008XA Age-related osteoporosis with current pathological fracture, vertebra(e), initial encounter for fracture: Secondary | ICD-10-CM | POA: Insufficient documentation

## 2022-02-18 DIAGNOSIS — Z87891 Personal history of nicotine dependence: Secondary | ICD-10-CM | POA: Insufficient documentation

## 2022-02-18 HISTORY — PX: KYPHOPLASTY: SHX5884

## 2022-02-18 HISTORY — DX: Other complications of anesthesia, initial encounter: T88.59XA

## 2022-02-18 HISTORY — DX: Arthropathic psoriasis, unspecified: L40.50

## 2022-02-18 HISTORY — DX: Herpesviral vesicular dermatitis: B00.1

## 2022-02-18 HISTORY — DX: Atopic dermatitis, unspecified: L20.9

## 2022-02-18 LAB — BASIC METABOLIC PANEL
Anion gap: 10 (ref 5–15)
BUN: 10 mg/dL (ref 8–23)
CO2: 24 mmol/L (ref 22–32)
Calcium: 8.9 mg/dL (ref 8.9–10.3)
Chloride: 103 mmol/L (ref 98–111)
Creatinine, Ser: 0.69 mg/dL (ref 0.44–1.00)
GFR, Estimated: >60 mL/min (ref >60)
Glucose, Bld: 99 mg/dL (ref 70–99)
Potassium: 3.8 mmol/L (ref 3.5–5.1)
Sodium: 137 mmol/L (ref 135–145)

## 2022-02-18 LAB — CBC
HCT: 40.6 % (ref 36.0–46.0)
Hemoglobin: 13.7 g/dL (ref 12.0–15.0)
MCH: 30.4 pg (ref 26.0–34.0)
MCHC: 33.7 g/dL (ref 30.0–36.0)
MCV: 90.2 fL (ref 80.0–100.0)
Platelets: 392 10*3/uL (ref 150–400)
RBC: 4.5 MIL/uL (ref 3.87–5.11)
RDW: 12.5 % (ref 11.5–15.5)
WBC: 7.8 10*3/uL (ref 4.0–10.5)
nRBC: 0 % (ref 0.0–0.2)

## 2022-02-18 LAB — SURGICAL PCR SCREEN
MRSA, PCR: NEGATIVE
Staphylococcus aureus: NEGATIVE

## 2022-02-18 SURGERY — KYPHOPLASTY
Anesthesia: General

## 2022-02-18 MED ORDER — IOPAMIDOL (ISOVUE-300) INJECTION 61%
INTRAVENOUS | Status: DC | PRN
  Administered 2022-02-18: 100 mL

## 2022-02-18 MED ORDER — CHLORHEXIDINE GLUCONATE CLOTH 2 % EX PADS: 6.0000 | MEDICATED_PAD | Freq: Once | CUTANEOUS | Status: DC

## 2022-02-18 MED ORDER — PROMETHAZINE HCL 25 MG/ML IJ SOLN: 6.2500 mg | INTRAMUSCULAR | Status: DC | PRN

## 2022-02-18 MED ORDER — CEFAZOLIN SODIUM-DEXTROSE 2-4 GM/100ML-% IV SOLN
INTRAVENOUS | Status: AC
  Filled 2022-02-18: qty 100

## 2022-02-18 MED ORDER — FENTANYL CITRATE (PF) 100 MCG/2ML IJ SOLN
INTRAMUSCULAR | Status: AC
  Filled 2022-02-18: qty 2

## 2022-02-18 MED ORDER — MIDAZOLAM HCL 2 MG/2ML IJ SOLN
INTRAMUSCULAR | Status: DC | PRN
  Administered 2022-02-18: 2 mg via INTRAVENOUS

## 2022-02-18 MED ORDER — 0.9 % SODIUM CHLORIDE (POUR BTL) OPTIME
TOPICAL | Status: DC | PRN
  Administered 2022-02-18: 1000 mL

## 2022-02-18 MED ORDER — PROPOFOL 10 MG/ML IV BOLUS
INTRAVENOUS | Status: AC
  Filled 2022-02-18: qty 20

## 2022-02-18 MED ORDER — FENTANYL CITRATE (PF) 250 MCG/5ML IJ SOLN
INTRAMUSCULAR | Status: AC
  Filled 2022-02-18: qty 5

## 2022-02-18 MED ORDER — ACETAMINOPHEN 500 MG PO TABS
1000.0000 mg | ORAL_TABLET | Freq: Once | ORAL | Status: AC
  Administered 2022-02-18: 1000 mg via ORAL
  Filled 2022-02-18: qty 2

## 2022-02-18 MED ORDER — ORAL CARE MOUTH RINSE: 15.0000 mL | Freq: Once | OROMUCOSAL | Status: AC

## 2022-02-18 MED ORDER — MIDAZOLAM HCL 2 MG/2ML IJ SOLN
INTRAMUSCULAR | Status: AC
  Filled 2022-02-18: qty 2

## 2022-02-18 MED ORDER — FENTANYL CITRATE (PF) 250 MCG/5ML IJ SOLN
INTRAMUSCULAR | Status: DC | PRN
  Administered 2022-02-18: 100 ug via INTRAVENOUS

## 2022-02-18 MED ORDER — LIDOCAINE-EPINEPHRINE 0.5 %-1:200000 IJ SOLN
INTRAMUSCULAR | Status: DC | PRN
  Administered 2022-02-18: 10 mL

## 2022-02-18 MED ORDER — LACTATED RINGERS IV SOLN: INTRAVENOUS | Status: DC

## 2022-02-18 MED ORDER — CEFAZOLIN SODIUM-DEXTROSE 2-4 GM/100ML-% IV SOLN
2.0000 g | INTRAVENOUS | Status: AC
  Administered 2022-02-18: 2 g via INTRAVENOUS

## 2022-02-18 MED ORDER — LIDOCAINE-EPINEPHRINE 0.5 %-1:200000 IJ SOLN
INTRAMUSCULAR | Status: AC
  Filled 2022-02-18: qty 1

## 2022-02-18 MED ORDER — FENTANYL CITRATE (PF) 100 MCG/2ML IJ SOLN
25.0000 ug | INTRAMUSCULAR | Status: DC | PRN
  Administered 2022-02-18 (×3): 50 ug via INTRAVENOUS

## 2022-02-18 MED ORDER — CHLORHEXIDINE GLUCONATE 0.12 % MT SOLN
15.0000 mL | Freq: Once | OROMUCOSAL | Status: AC
  Administered 2022-02-18: 15 mL via OROMUCOSAL
  Filled 2022-02-18: qty 15

## 2022-02-18 MED ORDER — LIDOCAINE 2% (20 MG/ML) 5 ML SYRINGE
INTRAMUSCULAR | Status: DC | PRN
  Administered 2022-02-18: 100 mg via INTRAVENOUS

## 2022-02-18 MED ORDER — PROPOFOL 10 MG/ML IV BOLUS
INTRAVENOUS | Status: DC | PRN
  Administered 2022-02-18: 200 mg via INTRAVENOUS

## 2022-02-18 MED ORDER — PHENYLEPHRINE 80 MCG/ML (10ML) SYRINGE FOR IV PUSH (FOR BLOOD PRESSURE SUPPORT)
PREFILLED_SYRINGE | INTRAVENOUS | Status: DC | PRN
  Administered 2022-02-18: 240 ug via INTRAVENOUS
  Administered 2022-02-18: 160 ug via INTRAVENOUS

## 2022-02-18 MED ORDER — ROCURONIUM BROMIDE 10 MG/ML (PF) SYRINGE
PREFILLED_SYRINGE | INTRAVENOUS | Status: DC | PRN
  Administered 2022-02-18: 10 mg via INTRAVENOUS
  Administered 2022-02-18: 50 mg via INTRAVENOUS

## 2022-02-18 MED ORDER — SUGAMMADEX SODIUM 200 MG/2ML IV SOLN
INTRAVENOUS | Status: DC | PRN
  Administered 2022-02-18: 200 mg via INTRAVENOUS

## 2022-02-18 MED ORDER — AMISULPRIDE (ANTIEMETIC) 5 MG/2ML IV SOLN: 10.0000 mg | Freq: Once | INTRAVENOUS | Status: DC | PRN

## 2022-02-18 MED ORDER — PHENYLEPHRINE HCL-NACL 20-0.9 MG/250ML-% IV SOLN
INTRAVENOUS | Status: DC | PRN
  Administered 2022-02-18: 50 ug/min via INTRAVENOUS

## 2022-02-18 SURGICAL SUPPLY — 35 items
BAG COUNTER SPONGE SURGICOUNT (BAG) ×1 IMPLANT
BLADE CLIPPER SURG (BLADE) IMPLANT
BLADE SURG 15 STRL LF DISP TIS (BLADE) ×1 IMPLANT
BLADE SURG 15 STRL SS (BLADE) ×1
CEMENT KYPHON C01A KIT/MIXER (Cement) IMPLANT
DERMABOND ADVANCED .7 DNX12 (GAUZE/BANDAGES/DRESSINGS) ×1 IMPLANT
DRAPE C-ARM 42X72 X-RAY (DRAPES) ×1 IMPLANT
DRAPE HALF SHEET 40X57 (DRAPES) ×1 IMPLANT
DRAPE LAPAROTOMY 100X72X124 (DRAPES) ×1 IMPLANT
DRAPE SURG 17X23 STRL (DRAPES) ×1 IMPLANT
DRAPE WARM FLUID 44X44 (DRAPES) ×1 IMPLANT
DURAPREP 26ML APPLICATOR (WOUND CARE) ×1 IMPLANT
GAUZE 4X4 16PLY ~~LOC~~+RFID DBL (SPONGE) ×1 IMPLANT
GLOVE ECLIPSE 6.5 STRL STRAW (GLOVE) ×1 IMPLANT
GLOVE EXAM NITRILE XL STR (GLOVE) IMPLANT
GOWN STRL REUS W/ TWL LRG LVL3 (GOWN DISPOSABLE) ×2 IMPLANT
GOWN STRL REUS W/ TWL XL LVL3 (GOWN DISPOSABLE) IMPLANT
GOWN STRL REUS W/TWL 2XL LVL3 (GOWN DISPOSABLE) IMPLANT
GOWN STRL REUS W/TWL LRG LVL3 (GOWN DISPOSABLE) ×2
GOWN STRL REUS W/TWL XL LVL3 (GOWN DISPOSABLE)
KIT BASIN OR (CUSTOM PROCEDURE TRAY) ×1 IMPLANT
KIT TURNOVER KIT B (KITS) ×1 IMPLANT
NDL HYPO 25X1 1.5 SAFETY (NEEDLE) ×1 IMPLANT
NEEDLE HYPO 25X1 1.5 SAFETY (NEEDLE) ×1 IMPLANT
NS IRRIG 1000ML POUR BTL (IV SOLUTION) ×1 IMPLANT
PACK BASIC III (CUSTOM PROCEDURE TRAY) ×1
PACK SRG BSC III STRL LF ECLPS (CUSTOM PROCEDURE TRAY) ×1 IMPLANT
PAD ARMBOARD 7.5X6 YLW CONV (MISCELLANEOUS) ×3 IMPLANT
SPECIMEN JAR SMALL (MISCELLANEOUS) IMPLANT
STAPLER SKIN PROX WIDE 3.9 (STAPLE) ×1 IMPLANT
SUT VIC AB 3-0 SH 8-18 (SUTURE) ×1 IMPLANT
SYR CONTROL 10ML LL (SYRINGE) ×2 IMPLANT
TOWEL GREEN STERILE (TOWEL DISPOSABLE) ×1 IMPLANT
TOWEL GREEN STERILE FF (TOWEL DISPOSABLE) ×1 IMPLANT
TRAY KYPHOPAK 15/3 ONESTEP 1ST (MISCELLANEOUS) IMPLANT

## 2022-02-18 NOTE — Anesthesia Preprocedure Evaluation (Addendum)
Anesthesia Evaluation  Patient identified by MRN, date of birth, ID band Patient awake    Reviewed: Allergy & Precautions, NPO status , Patient's Chart, lab work & pertinent test results  History of Anesthesia Complications Negative for: history of anesthetic complications  Airway Mallampati: II  TM Distance: >3 FB Neck ROM: Full    Dental no notable dental hx. (+) Dental Advisory Given   Pulmonary former smoker   Pulmonary exam normal        Cardiovascular negative cardio ROS Normal cardiovascular exam     Neuro/Psych negative neurological ROS     GI/Hepatic negative GI ROS, Neg liver ROS,,,  Endo/Other  negative endocrine ROS    Renal/GU negative Renal ROS     Musculoskeletal negative musculoskeletal ROS (+)    Abdominal   Peds  Hematology negative hematology ROS (+)   Anesthesia Other Findings   Reproductive/Obstetrics                             Anesthesia Physical Anesthesia Plan  ASA: 2  Anesthesia Plan: General   Post-op Pain Management: Tylenol PO (pre-op)* and Toradol IV (intra-op)*   Induction: Intravenous  PONV Risk Score and Plan: 3 and Ondansetron, Dexamethasone and Midazolam  Airway Management Planned: Oral ETT  Additional Equipment:   Intra-op Plan:   Post-operative Plan: Extubation in OR  Informed Consent: I have reviewed the patients History and Physical, chart, labs and discussed the procedure including the risks, benefits and alternatives for the proposed anesthesia with the patient or authorized representative who has indicated his/her understanding and acceptance.     Dental advisory given  Plan Discussed with: Anesthesiologist and CRNA  Anesthesia Plan Comments:        Anesthesia Quick Evaluation

## 2022-02-18 NOTE — Progress Notes (Signed)
Patient ambulated in PACU, tolerated well, gait steady, able to urinate, no brace needed per Dr. Christella Noa. Dr. Christella Noa also reports he sent pain prescription to pharmacy on file, not on [facility] due to being sent through his office. This was relayed to husband when giving discharge information.

## 2022-02-18 NOTE — Op Note (Signed)
02/18/2022  5:53 PM  PATIENT:  Ann Newton  67 y.o. female With compression fractures at T9, and 10 PRE-OPERATIVE DIAGNOSIS:  Thoracic compression fracture  POST-OPERATIVE DIAGNOSIS:  Thoracic compression fracture  PROCEDURE:  Procedure(s): THORACIC NINE, THORACIC TEN KYPHOPLASTY  SURGEON:  Surgeon(s): Ashok Pall, MD  ANESTHESIA:   general  EBL:  Total I/O In: 1100 [I.V.:1000; IV Piggyback:100] Out: -   BLOOD ADMINISTERED:none  COUNT:per nursing  SPECIMEN:  No Specimen  DICTATION: Ann Newton was taken to the operating room, intubated and placed under a general anesthetic without difficulty. She was positioned prone on the operating room table with all pressure points properly padded. Her back was prepped and draped in a sterile manner. With fluoroscopy I localized the T9, and T10 pedicles bilaterally. I injected lidocaine into the entry sites on both the left and right sides. I started by making a stab incision on the left side and entering the T9, and T10 pedicles with fluoroscopic guidance. Once good position was obtained, I drilled into the vertebral body. I then placed the kyphoplasty balloon into the T9 and T10 vertebra and inflated the balloon. I then inserted methylmethacrylate into the vertebral body under fluoroscopic guidance. I achieved a good fill of the cavity, staying within the confines of the vertebral body. I removed the instrumentation from the vertebral body, and the final films looked good. I closed the stab incision with vicryl suture and used Dermabond for a sterile dressing. Marland Kitchen    PLAN OF CARE: Admit to inpatient   PATIENT DISPOSITION:  PACU - hemodynamically stable.   Delay start of Pharmacological VTE agent (>24hrs) due to surgical blood loss or risk of bleeding:  yes

## 2022-02-18 NOTE — Anesthesia Procedure Notes (Signed)
Procedure Name: Intubation Date/Time: 02/18/2022 4:22 PM  Performed by: Ester Rink, CRNAPre-anesthesia Checklist: Patient identified, Emergency Drugs available, Suction available and Patient being monitored Patient Re-evaluated:Patient Re-evaluated prior to induction Oxygen Delivery Method: Circle system utilized Preoxygenation: Pre-oxygenation with 100% oxygen Induction Type: IV induction Ventilation: Mask ventilation without difficulty Laryngoscope Size: Mac and 4 Grade View: Grade I Tube type: Oral Tube size: 7.0 mm Number of attempts: 1 Airway Equipment and Method: Stylet and Oral airway Placement Confirmation: ETT inserted through vocal cords under direct vision, positive ETCO2 and breath sounds checked- equal and bilateral Secured at: 22 cm Tube secured with: Tape Dental Injury: Teeth and Oropharynx as per pre-operative assessment

## 2022-02-18 NOTE — H&P (Signed)
BP 138/70   Pulse 95   Temp 98.9 F (37.2 C) (Oral)   Resp 18   Ht 5\' 8"  (1.727 m)   Wt 81.6 kg   SpO2 98%   BMI 27.37 kg/m  Ann Newton is a 67 y.o. female With pain due to thoracic compression fractures at T9, and T10 She has no neurological symptoms. She does report pain. No Known Allergies Past Medical History:  Diagnosis Date   Arthritis    Atopic dermatitis    Complication of anesthesia    woke up early during colonoscopy and dental implant   Fever blister    Hyperlipidemia    diet controlled   Psoriasis    Psoriatic arthritis ([place])    Thyroid disease    as a child, no problems as an adult   Wears contact lenses    Past Surgical History:  Procedure Laterality Date   ABDOMINAL HYSTERECTOMY  2000   part   ANTERIOR AND POSTERIOR REPAIR N/A 09/08/2016   Procedure: ANTERIOR (CYSTOCELE) AND POSTERIOR REPAIR (RECTOCELE)/Perineorrhaphy;  Surgeon: Brien Few, MD;  Location: [facility];  Service: Gynecology;  Laterality: N/A;  Requests 25min. Repair of enterocele/posterior   COLONOSCOPY     ~12 yrs ago- normal per pt    COSMETIC SURGERY  1999   breast implants   dental implants     with sedation   HAGLAND'S DEFORMITY EXCISION Left 09/13/2013   Procedure: LEFT FOOT: EXCISION INTERDIGITAL MORTON'S NEUROMA SINGLE EACH;  Surgeon: Yvette Rack., MD;  Location: [facility];  Service: Orthopedics;  Laterality: Left;   KNEE ARTHROSCOPY  2013,2011   right/left   SHOULDER ARTHROSCOPY     rt   TUBAL LIGATION     WISDOM TOOTH EXTRACTION     Family History  Problem Relation Age of Onset   Kidney disease Mother    Diabetes Mother    Heart attack Father    Rheum arthritis Brother    Healthy Daughter    Healthy Son    Heart attack Maternal Grandmother    Heart attack Maternal Grandfather    Cancer Paternal Grandmother    Heart attack Paternal Grandfather    Rheum arthritis Brother    Colon cancer Neg Hx    Colon polyps Neg Hx    Esophageal cancer  Neg Hx    Rectal cancer Neg Hx    Stomach cancer Neg Hx    . Social History   Socioeconomic History   Marital status: Married    Spouse name: Not on file   Number of children: Not on file   Years of education: Not on file   Highest education level: Not on file  Occupational History   Not on file  Tobacco Use   Smoking status: Former    Packs/day: 0.50    Years: 10.00    Total pack years: 5.00    Types: Cigarettes    Quit date: 09/12/2012    Years since quitting: 9.4    Passive exposure: Current   Smokeless tobacco: Never  Vaping Use   Vaping Use: Never used  Substance and Sexual Activity   Alcohol use: No   Drug use: No   Sexual activity: Yes    Birth control/protection: Surgical    Comment: hysterectomy  Other Topics Concern   Not on file  Social History Narrative   Right handed    Social Determinants of Health   Financial Resource Strain: Not on file  Food Insecurity: Not  on file  Transportation Needs: Not on file  Physical Activity: Not on file  Stress: Not on file  Social Connections: Not on file  Intimate Partner Violence: Not on file   Physical Exam Constitutional:      Appearance: Normal appearance. She is normal weight.  HENT:     Head: Normocephalic and atraumatic.     Mouth/Throat:     Mouth: Mucous membranes are moist.  Eyes:     Extraocular Movements: Extraocular movements intact.     Pupils: Pupils are equal, round, and reactive to light.  Cardiovascular:     Rate and Rhythm: Normal rate and regular rhythm.  Pulmonary:     Effort: Pulmonary effort is normal.  Abdominal:     General: Abdomen is flat.     Palpations: Abdomen is soft.  Musculoskeletal:        General: Normal range of motion.     Cervical back: Normal range of motion.  Skin:    General: Skin is warm and dry.  Neurological:     General: No focal deficit present.     Mental Status: She is alert and oriented to person, place, and time.  Psychiatric:        Mood and  Affect: Mood normal.    OR for compression fractures, kyphoplasty

## 2022-02-18 NOTE — Transfer of Care (Signed)
Immediate Anesthesia Transfer of Care Note  Patient: Ann Newton  Procedure(s) Performed: THORACIC NINE, THORACIC TEN KYPHOPLASTY  Patient Location: PACU  Anesthesia Type:General  Level of Consciousness: drowsy and patient cooperative  Airway & Oxygen Therapy: Patient Spontanous Breathing and Patient connected to nasal cannula oxygen  Post-op Assessment: Report given to RN and Post -op Vital signs reviewed and stable  Post vital signs: Reviewed and stable  Last Vitals:  Vitals Value Taken Time  BP    Temp    Pulse 86 02/18/22 1753  Resp 13 02/18/22 1753  SpO2 100 % 02/18/22 1753  Vitals shown include unvalidated device data.  Last Pain:  Vitals:   02/18/22 1023  TempSrc:   PainSc: 10-Worst pain ever         Complications: No notable events documented.

## 2022-02-18 NOTE — Anesthesia Postprocedure Evaluation (Signed)
Anesthesia Post Note  Patient: Ann Newton  Procedure(s) Performed: THORACIC NINE, THORACIC TEN KYPHOPLASTY     Patient location during evaluation: PACU Anesthesia Type: General Level of consciousness: awake and alert, patient cooperative and oriented Pain management: pain level controlled Vital Signs Assessment: post-procedure vital signs reviewed and stable Respiratory status: spontaneous breathing, nonlabored ventilation and respiratory function stable Cardiovascular status: blood pressure returned to baseline and stable Postop Assessment: no apparent nausea or vomiting Anesthetic complications: no   No notable events documented.  Last Vitals:  Vitals:   02/18/22 1755 02/18/22 1810  BP: (!) 147/92 (!) 148/87  Pulse: 87 74  Resp: 20 17  Temp: 36.6 C   SpO2: 100% 97%    Last Pain:  Vitals:   02/18/22 1810  TempSrc:   PainSc: 6                  Appolonia Ackert,E. Koda Routon

## 2022-02-19 ENCOUNTER — Encounter (HOSPITAL_COMMUNITY): Payer: Self-pay | Admitting: Neurosurgery

## 2022-02-19 ENCOUNTER — Other Ambulatory Visit: Payer: 59

## 2022-03-14 ENCOUNTER — Ambulatory Visit
Admission: RE | Admit: 2022-03-14 | Discharge: 2022-03-14 | Disposition: A | Discharge status: Home / Self Care | Payer: 59 | Source: Ambulatory Visit | Attending: Obstetrics and Gynecology | Admitting: Obstetrics and Gynecology

## 2022-03-14 DIAGNOSIS — E2839 Other primary ovarian failure: Secondary | ICD-10-CM

## 2022-03-18 ENCOUNTER — Other Ambulatory Visit: Payer: 59

## 2022-04-21 NOTE — Progress Notes (Unsigned)
Office Visit Note  Patient: Ann Newton             Date of Birth: July 17, 1955           MRN: [id number]             PCP: Carlean Jews, NP Referring: Carlean Jews, NP Visit Date: 05/05/2022 Occupation: @  Subjective:  Medication monitoring   History of Present Illness: Ann Newton is a 67 y.o. female with history of psoriatic arthritis.  Patient is prescribed skyrizi 150 mg sq injections every 3 months. Initiated on Everly on 12/30/2021.   CBC and BMP updated on 02/18/22.  TB gold negative on 09/09/21.  Discussed the importance of holding skyrizi if she develops signs or symptoms of an infection and to resume once the infection has completely cleared.    DEXA updated on 03/14/22: The BMD measured at Femur Neck Left is 0.719 g/cm2 with a T-score of -2.3.  Activities of Daily Living:  Patient reports morning stiffness for *** {minute/hour:19697}.   Patient {ACTIONS;DENIES/REPORTS:21021675::"Denies"} nocturnal pain.  Difficulty dressing/grooming: {ACTIONS;DENIES/REPORTS:21021675::"Denies"} Difficulty climbing stairs: {ACTIONS;DENIES/REPORTS:21021675::"Denies"} Difficulty getting out of chair: {ACTIONS;DENIES/REPORTS:21021675::"Denies"} Difficulty using hands for taps, buttons, cutlery, and/or writing: {ACTIONS;DENIES/REPORTS:21021675::"Denies"}  No Rheumatology ROS completed.   PMFS History:  Patient Active Problem List   Diagnosis Date Noted   Atopic dermatitis 07/26/2021   Elevated blood pressure reading 05/02/2021   Acute non-recurrent pansinusitis 04/07/2021   Weight loss 02/25/2021   Fever blister 02/04/2021   Edema 02/04/2021   Thyroid nodule greater than or equal to 1 cm in diameter incidentally noted on imaging study 11/20/2018   Fatigue 11/09/2018   Elevated glucose 11/09/2018   Snores 11/09/2018   Hair loss 11/09/2018   Need for influenza vaccination 11/09/2018   Thyromegaly 11/09/2018   Allergic reaction 05/17/2018   Maculopapular  rash 05/17/2018   Family history of rheumatoid arthritis 03/09/2018   History of vitamin D deficiency 03/09/2018   Primary osteoarthritis of both knees 02/22/2018   Primary osteoarthritis of both hands 02/22/2018   Neuropathy 11/22/2017   Chronic pain of both knees 11/22/2017   Need for shingles vaccine 11/22/2017   Vitamin D deficiency 01/24/2016   Smoking hx- ( less 1 ppd * 15 yrs) - Quit Jan 17 2013 12/28/2015   Elevated LDL cholesterol level 12/28/2015   Elevated vitamin B12 level 12/28/2015   h/o Mild peripheral edema- takes HCTZ for this 12/20/2015   Dietary B12 deficiency 12/20/2015   History of chickenpox ( Aug '17 )  12/20/2015   Healthcare maintenance 12/20/2015   Body mass index 28.0-28.9, adult 12/03/2015   h/o Hypertriglyceridemia 12/03/2015   Mixed hyperlipidemia 12/03/2015   Pustular psoriasis of palms and soles 10/28/2010    Past Medical History:  Diagnosis Date   Arthritis    Atopic dermatitis    Complication of anesthesia    woke up early during colonoscopy and dental implant   Fever blister    Hyperlipidemia    diet controlled   Psoriasis    Psoriatic arthritis (HCC)    Thyroid disease    as a child, no problems as an adult   Wears contact lenses     Family History  Problem Relation Age of Onset   Kidney disease Mother    Diabetes Mother    Heart attack Father    Rheum arthritis Brother    Healthy Daughter    Healthy Son    Heart attack Maternal Grandmother    Heart attack  Maternal Grandfather    Cancer Paternal Grandmother    Heart attack Paternal Grandfather    Rheum arthritis Brother    Colon cancer Neg Hx    Colon polyps Neg Hx    Esophageal cancer Neg Hx    Rectal cancer Neg Hx    Stomach cancer Neg Hx    Past Surgical History:  Procedure Laterality Date   ABDOMINAL HYSTERECTOMY  2000   part   ANTERIOR AND POSTERIOR REPAIR N/A 09/08/2016   Procedure: ANTERIOR (CYSTOCELE) AND POSTERIOR REPAIR (RECTOCELE)/Perineorrhaphy;  Surgeon:  Olivia Mackie, MD;  Location: [facility];  Service: Gynecology;  Laterality: N/A;  Requests . Repair of enterocele/posterior   COLONOSCOPY     ~12 yrs ago- normal per pt    COSMETIC SURGERY  1999   breast implants   dental implants     with sedation   HAGLAND'S DEFORMITY EXCISION Left 09/13/2013   Procedure: LEFT FOOT: EXCISION INTERDIGITAL MORTON'S NEUROMA SINGLE EACH;  Surgeon: Thera Flake., MD;  Location: [facility];  Service: Orthopedics;  Laterality: Left;   KNEE ARTHROSCOPY  2013,2011   right/left   KYPHOPLASTY N/A 02/18/2022   Procedure: THORACIC NINE, THORACIC TEN KYPHOPLASTY;  Surgeon: Coletta Memos, MD;  Location: [facility];  Service: Neurosurgery;  Laterality: N/A;  RM 19   SHOULDER ARTHROSCOPY     rt   TUBAL LIGATION     WISDOM TOOTH EXTRACTION     Social History   Social History Narrative   Right handed    Immunization History  Administered Date(s) Administered   Influenza,inj,Quad PF,6+ Mos 12/03/2015, 11/22/2017, 11/09/2018, 12/11/2019, 02/02/2021   Moderna Sars-Covid-2 Vaccination 08/13/2019, 08/19/2019, 09/16/2019   Pneumococcal Polysaccharide-23 11/22/2017   Tdap 02/09/2009, 05/29/2019   Zoster Recombinat (Shingrix) 11/09/2018, 05/29/2019     Objective: Vital Signs: There were no vitals taken for this visit.   Physical Exam Vitals and nursing note reviewed.  Constitutional:      Appearance: She is well-developed.  HENT:     Head: Normocephalic and atraumatic.  Eyes:     Conjunctiva/sclera: Conjunctivae normal.  Cardiovascular:     Rate and Rhythm: Normal rate and regular rhythm.     Heart sounds: Normal heart sounds.  Pulmonary:     Effort: Pulmonary effort is normal.     Breath sounds: Normal breath sounds.  Abdominal:     General: Bowel sounds are normal.     Palpations: Abdomen is soft.  Musculoskeletal:     Cervical back: Normal range of motion.  Lymphadenopathy:     Cervical: No cervical adenopathy.  Skin:    General: Skin  is warm and dry.     Capillary Refill: Capillary refill takes less than 2 seconds.  Neurological:     Mental Status: She is alert and oriented to person, place, and time.  Psychiatric:        Behavior: Behavior normal.      Musculoskeletal Exam: ***  CDAI Exam: CDAI Score: -- Patient Global: --; Provider Global: -- Swollen: --; Tender: -- Joint Exam 05/05/2022   No joint exam has been documented for this visit   There is currently no information documented on the homunculus. Go to the Rheumatology activity and complete the homunculus joint exam.  Investigation: No additional findings.  Imaging: No results found.  Recent Labs: Lab Results  Component Value Date   WBC 7.8 02/18/2022   HGB 13.7 02/18/2022   PLT 392 02/18/2022   NA 137 02/18/2022   K 3.8  02/18/2022   CL 103 02/18/2022   CO2 24 02/18/2022   GLUCOSE 99 02/18/2022   BUN 10 02/18/2022   CREATININE 0.69 02/18/2022   BILITOT 0.4 02/03/2022   ALKPHOS 141 (H) 01/28/2021   AST 17 02/03/2022   ALT 22 02/03/2022   PROT 7.2 02/03/2022   ALBUMIN 4.5 01/28/2021   CALCIUM 8.9 02/18/2022   GFRAA 92 07/09/2020   QFTBGOLDPLUS NEGATIVE 09/08/2021    Speciality Comments: Otezla stopped 08/01/20 Tremfya started 08/03/20 - stopped 08/10/21 Cosentyx started 10/06/21- stopped 12/01/21; Skyrizi started 12/30/21  Procedures:  No procedures performed Allergies: Patient has no known allergies.   Assessment / Plan:     Visit Diagnoses: Psoriatic arthropathy  Pustular psoriasis of palms and soles  High risk medication use  Primary osteoarthritis of both hands  Primary osteoarthritis of both hips  Primary osteoarthritis of both knees  Paresthesia of both feet  Family history of rheumatoid arthritis  History of vitamin D deficiency  h/o Hypertriglyceridemia  Smoking hx- ( less 1 ppd * 15 yrs) - Quit Jan 17 2013  Neuropathy  Orders: No orders of the defined types were placed in this encounter.  No orders  of the defined types were placed in this encounter.   Face-to-face time spent with patient was *** minutes. Greater than 50% of time was spent in counseling and coordination of care.  Follow-Up Instructions: No follow-ups on file.   Gearldine Bienenstock, PA-C  Note - This record has been created using Dragon software.  Chart creation errors have been sought, but may not always  have been located. Such creation errors do not reflect on  the standard of medical care.

## 2022-05-05 ENCOUNTER — Ambulatory Visit: Payer: 59 | Attending: Physician Assistant | Admitting: Physician Assistant

## 2022-05-05 ENCOUNTER — Encounter: Payer: Self-pay | Admitting: Physician Assistant

## 2022-05-05 VITALS — BP 121/66 | HR 106 | Resp 15 | Ht 67.0 in | Wt 181.0 lb

## 2022-05-05 DIAGNOSIS — L4050 Arthropathic psoriasis, unspecified: Secondary | ICD-10-CM | POA: Diagnosis not present

## 2022-05-05 DIAGNOSIS — M8589 Other specified disorders of bone density and structure, multiple sites: Secondary | ICD-10-CM

## 2022-05-05 DIAGNOSIS — Z9889 Other specified postprocedural states: Secondary | ICD-10-CM

## 2022-05-05 DIAGNOSIS — E559 Vitamin D deficiency, unspecified: Secondary | ICD-10-CM

## 2022-05-05 DIAGNOSIS — Z8639 Personal history of other endocrine, nutritional and metabolic disease: Secondary | ICD-10-CM

## 2022-05-05 DIAGNOSIS — M19041 Primary osteoarthritis, right hand: Secondary | ICD-10-CM

## 2022-05-05 DIAGNOSIS — Z8781 Personal history of (healed) traumatic fracture: Secondary | ICD-10-CM

## 2022-05-05 DIAGNOSIS — M160 Bilateral primary osteoarthritis of hip: Secondary | ICD-10-CM

## 2022-05-05 DIAGNOSIS — Z79899 Other long term (current) drug therapy: Secondary | ICD-10-CM

## 2022-05-05 DIAGNOSIS — G629 Polyneuropathy, unspecified: Secondary | ICD-10-CM

## 2022-05-05 DIAGNOSIS — L403 Pustulosis palmaris et plantaris: Secondary | ICD-10-CM

## 2022-05-05 DIAGNOSIS — M170 Bilateral primary osteoarthritis of knee: Secondary | ICD-10-CM

## 2022-05-05 DIAGNOSIS — M19042 Primary osteoarthritis, left hand: Secondary | ICD-10-CM

## 2022-05-05 DIAGNOSIS — Z8261 Family history of arthritis: Secondary | ICD-10-CM

## 2022-05-05 DIAGNOSIS — R202 Paresthesia of skin: Secondary | ICD-10-CM

## 2022-05-05 DIAGNOSIS — E781 Pure hyperglyceridemia: Secondary | ICD-10-CM

## 2022-05-05 DIAGNOSIS — Z87891 Personal history of nicotine dependence: Secondary | ICD-10-CM

## 2022-05-05 LAB — CBC WITH DIFFERENTIAL/PLATELET
Absolute Monocytes: 526 cells/uL (ref 200–950)
Basophils Absolute: 29 cells/uL (ref 0–200)
HCT: 42.8 % (ref 35.0–45.0)
MCH: 30 pg (ref 27.0–33.0)
MPV: 10.2 fL (ref 7.5–12.5)
Neutrophils Relative %: 54.7 %
RBC: 4.84 10*6/uL (ref 3.80–5.10)

## 2022-05-05 NOTE — Patient Instructions (Signed)
Romosozumab Injection What is this medication? ROMOSOZUMAB (roe moe SOZ ue mab) prevents and treats osteoporosis. It works by Paramedic stronger and less likely to break (fracture). It is a monoclonal antibody. This medicine may be used for other purposes; ask your health care provider or pharmacist if you have questions. COMMON BRAND NAME(S): EVENITY What should I tell my care team before I take this medication? They need to know if you have any of these conditions: Dental disease Heart attack Heart disease Kidney problems Low levels of calcium in the blood On dialysis Stroke Wear dentures An unusual or allergic reaction to romosozumab, other medications, foods, dyes or preservatives Pregnant or trying to get pregnant Breast-feeding How should I use this medication? This medication is injected under the skin. It is given by your care team in a hospital or clinic setting. A special MedGuide will be given to you by the pharmacist with each prescription and refill. Be sure to read this information carefully each time. Talk to your care team about the use of this medication in children. Special care may be needed. Overdosage: If you think you have taken too much of this medicine contact a poison control center or emergency room at once. NOTE: This medicine is only for you. Do not share this medicine with others. What if I miss a dose? Keep appointments for follow-up doses. It is important not to miss your dose. Call your care team if you are unable to keep an appointment. What may interact with this medication? Interactions are not expected. This list may not describe all possible interactions. Give your health care provider a list of all the medicines, herbs, non-prescription drugs, or dietary supplements you use. Also tell them if you smoke, drink alcohol, or use illegal drugs. Some items may interact with your medicine. What should I watch for while using this medication? Your  condition will be monitored carefully while you are receiving this medication. You may need bloodwork while taking this medication. You should make sure you get enough calcium and vitamin D while you are taking this medication. Discuss the foods you eat and the vitamins you take with your care team. Some people who take this medication have severe bone, joint, or muscle pain. This medication may also increase your risk for jaw problems or a broken thigh bone. Tell your care team right away if you have severe pain in your jaw, bones, joints, or muscles. Tell you care team if you have any pain that does not go away or that gets worse. Tell your dentist and dental surgeon that you are taking this medication. You should not have major dental surgery while on this medication. See your dentist to have a dental exam and fix any dental problems before starting this medication. Take good care of your teeth while on this medication. Make sure you see your dentist for regular follow-up appointments. What side effects may I notice from receiving this medication? Side effects that you should report to your care team as soon as possible: Allergic reactions or angioedema--skin rash, itching or hives, swelling of the face, eyes, lips, tongue, arms, or legs, trouble swallowing or breathing Heart attack--pain or tightness in the chest, shoulders, arms, or jaw, nausea, shortness of breath, cold or clammy skin, feeling faint or lightheaded Low calcium level--muscle pain or cramps, confusion, tingling, or numbness in the hands or feet Osteonecrosis of the jaw--pain, swelling, or redness in the mouth, numbness of the jaw, poor healing after dental work,  unusual discharge from the mouth, visible bones in the mouth Severe bone, joint, or muscle pain Stroke--sudden numbness or weakness of the face, arm, or leg, trouble speaking, confusion, trouble walking, loss of balance or coordination, dizziness, severe headache, change in  vision Side effects that usually do not require medical attention (report to your care team if they continue or are bothersome): Headache Joint pain Muscle spasms Pain, redness, or irritation at injection site Swelling of the ankles, hands, or feet This list may not describe all possible side effects. Call your doctor for medical advice about side effects. You may report side effects to FDA at 1-800-FDA-1088. Where should I keep my medication? This medication is given in a hospital or clinic. It will not be stored at home. NOTE: This sheet is a summary. It may not cover all possible information. If you have questions about this medicine, talk to your doctor, pharmacist, or health care provider.  2023 Elsevier/Gold Standard (2021-02-03 00:00:00) Guselkumab Injection What is this medication? GUSELKUMAB (goo ZELK ue mab) treats autoimmune conditions, such as arthritis and psoriasis. It works by slowing down an overactive immune system. It is a monoclonal antibody. This medicine may be used for other purposes; ask your health care provider or pharmacist if you have questions. COMMON BRAND NAME(S): Tremfya What should I tell my care team before I take this medication? They need to know if you have any of these conditions: Immune system problems Infection, such as a virus infection, chickenpox, cold sores, herpes, or a history of infections Recently received or scheduled to receive a vaccine Tuberculosis, a positive skin test for tuberculosis, or recent close contact with someone who has tuberculosis An unusual or allergic reaction to guselkumab, other medications, foods, dyes, or preservatives Pregnant or trying to get pregnant Breast-feeding How should I use this medication? This medication is injected under the skin. It is usually given by a care team in a hospital or clinic setting. It may also be given at home. If you get this medication at home, you will be taught how to prepare and give  it. Use exactly as directed. Take it as directed on the prescription label. Keep taking it unless your care team tells you to stop. It is important that you put your used needles and syringes in a special sharps container. Do not put them in a trash can. If you do not have a sharps container, call your pharmacist or care team to get one. This medication comes with INSTRUCTIONS FOR USE. Ask your pharmacist for directions on how to use this medication. Read the information carefully. Talk to your pharmacist or care team if you have questions. A special MedGuide will be given to you by the pharmacist with each prescription and refill. Be sure to read this information carefully each time. Talk to your care team about the use of this medication in children. Special care may be needed. Overdosage: If you think you have taken too much of this medicine contact a poison control center or emergency room at once. NOTE: This medicine is only for you. Do not share this medicine with others. What if I miss a dose? If you get this medication at a hospital or clinic: It is important not to miss your dose. Call your care team if you are unable to keep an appointment. If you give yourself this medication at home: If you miss a dose, take it as soon as you can. Then continue your normal schedule. If it is almost  time for your next dose, take only that dose. Do not take double or extra doses. Call your care team with questions. What may interact with this medication? Do not take this medication with any of the following: Live virus vaccines This medication may also interact with the following: Amoxapine Certain medications for depression, anxiety, or mental health conditions, such as amitriptyline, clomipramine, desipramine, doxepin, imipramine, maprotiline, nortriptyline, protriptyline, trimipramine Codeine Inactivated vaccines Methadone Pimozide Thioridazine This list may not describe all possible interactions.  Give your health care provider a list of all the medicines, herbs, non-prescription drugs, or dietary supplements you use. Also tell them if you smoke, drink alcohol, or use illegal drugs. Some items may interact with your medicine. What should I watch for while using this medication? Your condition will be monitored carefully while you are receiving this medication. Tell your care team if your symptoms do not start to get better or if they get worse. You will be tested for tuberculosis (TB) before you start this medication. If your care team prescribes any medication for TB, you should start taking the TB medication before starting this medication. Make sure to finish the full course of TB medication. This medication may increase your risk of getting an infection. Call your care team for advice if you get a fever, chills, sore throat, or other symptoms of a cold or flu. Do not treat yourself. Try to avoid being around people who are sick. What side effects may I notice from receiving this medication? Side effects that you should report to your care team as soon as possible: Allergic reactions--skin rash, itching, hives, swelling of the face, lips, tongue, or throat Infection--fever, chills, cough, sore throat, wounds that don't heal, pain or trouble when passing urine, general feeling of discomfort or being unwell Side effects that usually do not require medical attention (report to your care team if they continue or are bothersome): Diarrhea Headache Joint pain Pain, redness, or irritation at injection site Runny or stuffy nose Sore throat This list may not describe all possible side effects. Call your doctor for medical advice about side effects. You may report side effects to FDA at 1-800-FDA-1088. Where should I keep my medication? Keep out of the reach of children and pets. Store in the refrigerator. Do not freeze. Keep it in the original container until you are ready to use. Protect from  light. Do not shake. Remove the dose from the refrigerator about 30 minutes prior to use. Get rid of any unused medication after the expiration date on the label. To get rid of medications that are no longer needed or have expired: Take the medication to a medication take-back program. Check with your pharmacy or law enforcement to find a location. If you cannot return the medication, ask your pharmacist or care team how to get rid of this medication safely. NOTE: This sheet is a summary. It may not cover all possible information. If you have questions about this medicine, talk to your doctor, pharmacist, or health care provider.  2023 Elsevier/Gold Standard (2021-03-17 00:00:00)

## 2022-05-06 ENCOUNTER — Encounter: Payer: Self-pay | Admitting: Physician Assistant

## 2022-05-06 ENCOUNTER — Other Ambulatory Visit (HOSPITAL_COMMUNITY): Payer: Self-pay

## 2022-05-06 ENCOUNTER — Telehealth: Payer: Self-pay | Admitting: Pharmacy Technician

## 2022-05-06 ENCOUNTER — Telehealth: Payer: Self-pay | Admitting: Pharmacist

## 2022-05-06 DIAGNOSIS — Z9889 Other specified postprocedural states: Secondary | ICD-10-CM

## 2022-05-06 DIAGNOSIS — Z8781 Personal history of (healed) traumatic fracture: Secondary | ICD-10-CM

## 2022-05-06 DIAGNOSIS — M810 Age-related osteoporosis without current pathological fracture: Secondary | ICD-10-CM

## 2022-05-06 LAB — CBC WITH DIFFERENTIAL/PLATELET
Basophils Relative: 0.4 %
Eosinophils Absolute: 110 cells/uL (ref 15–500)
Eosinophils Relative: 1.5 %
Hemoglobin: 14.5 g/dL (ref 11.7–15.5)
Lymphs Abs: 2643 cells/uL (ref 850–3900)
MCHC: 33.9 g/dL (ref 32.0–36.0)
MCV: 88.4 fL (ref 80.0–100.0)
Monocytes Relative: 7.2 %
Neutro Abs: 3993 cells/uL (ref 1500–7800)
Platelets: 445 10*3/uL — ABNORMAL HIGH (ref 140–400)
RDW: 12.7 % (ref 11.0–15.0)
Total Lymphocyte: 36.2 %
WBC: 7.3 10*3/uL (ref 3.8–10.8)

## 2022-05-06 LAB — COMPLETE METABOLIC PANEL WITH GFR
AG Ratio: 1.5 (calc) (ref 1.0–2.5)
ALT: 23 U/L (ref 6–29)
AST: 20 U/L (ref 10–35)
Albumin: 4.5 g/dL (ref 3.6–5.1)
Alkaline phosphatase (APISO): 118 U/L (ref 37–153)
BUN: 15 mg/dL (ref 7–25)
CO2: 28 mmol/L (ref 20–32)
Calcium: 9.9 mg/dL (ref 8.6–10.4)
Chloride: 101 mmol/L (ref 98–110)
Creat: 0.75 mg/dL (ref 0.50–1.05)
Globulin: 3 g/dL (calc) (ref 1.9–3.7)
Glucose, Bld: 88 mg/dL (ref 65–99)
Potassium: 4.7 mmol/L (ref 3.5–5.3)
Sodium: 138 mmol/L (ref 135–146)
Total Bilirubin: 0.3 mg/dL (ref 0.2–1.2)
Total Protein: 7.5 g/dL (ref 6.1–8.1)
eGFR: 88 mL/min/{1.73_m2} (ref >=60)

## 2022-05-06 NOTE — Telephone Encounter (Addendum)
Referral for Evenity treatment plan placed for [employer].  Dose:  SQ monthly x 12 months  Chesley Mires, PharmD, MPH, BCPS, CPP Clinical Pharmacist (Rheumatology and Pulmonology)  ----- Message from Ellen Henri, CMA sent at 05/05/2022  4:24 PM EDT ----- Please apply for evenity and reapply for tremfya, per Sherron Ales, PA-C.   Consent obtained on both and sent to the scan center. Thanks!

## 2022-05-06 NOTE — Telephone Encounter (Addendum)
Per secure Epic chat from Sherron Ales, PA-C may consider starting patient sooner than 12 weeks after last Skyrizi dose due to significant worsening of PsA  Submitted a Prior Authorization request to [facility] for [facility] via CoverMyMeds. Will update once we receive a response.  Key: [id number]  Per automated response: This medication or product was previously approved on [id number] from 2021-08-02 to 2022-08-03. Unable to run test claim because patient must fill w [facility] as before  Will discuss w Sherron Ales in office next week for next steps on timing of restarting and additional loading dose  Chesley Mires, PharmD, MPH, BCPS, CPP Clinical Pharmacist (Rheumatology and Pulmonology)  ----- Message from Ellen Henri, CMA sent at 05/05/2022  4:24 PM EDT ----- Please apply for evenity and reapply for tremfya, per Sherron Ales, PA-C.   Consent obtained on both and sent to the scan center. Thanks!

## 2022-05-06 NOTE — Progress Notes (Signed)
CMP WNL  Plt count is elevated-445k--could be secondary to inflammation. Rest of CBC Wnl.

## 2022-05-06 NOTE — Telephone Encounter (Addendum)
Andrew Au note:  Auth Submission: APPROVED Site of care: Site of care: [facility] Payer: [employer] Medication & CPT/J Code(s) submitted: Evenity (Romosozumab) [id number] Route of submission (phone, fax, portal): PORTAL Phone # Fax # Auth type: Buy/Bill Units/visits requested: 12 Reference number: [id number] Approval from: 05/06/22 to 05/06/23   Patient will be scheduled as soon as possible  Evenity co-pay card: approved Id: [id number] Bin: [id number] Pcn: [id number] GR: [id number] EXP: 10/16/25

## 2022-05-09 NOTE — Telephone Encounter (Signed)
I called the patient to discuss the current treatment plan.  I spoke with Dr. Corliss Skains this morning in regards to switching the patient to tremfya.  Unfortunately she will be unable to initiate Tremfya until the end of June 2024 since her last dose of Cristy Folks was administered on 04/14/2022. Patient previously had a severe intolerance to methotrexate so this would not be a treatment option for combination therapy in the meantime.  Discussed restarting Otezla as combination therapy until she is able to initiate Tremfya.  She previously tolerated Otezla without any side effects.  H[facility] [place] helped to manage her psoriasis at that time but she was having breakthrough symptoms of PsA. Plan to give the patient a starter pack of Otezla along with a sample (maintenance dose 30 mg twice daily).  She will notify us if she cannot tolerate taking Otezla.   Sherron Ales, PA-C

## 2022-05-09 NOTE — Telephone Encounter (Signed)
Spoke with patient and advised her she could pick up samples including a starter pack and then an additional month supply. Advised patient we could provide more at that point if it is needed. Samples have been reserved in the cabinet for the patient. Patient will stop by the office tomorrow to pick up.

## 2022-05-10 NOTE — Telephone Encounter (Signed)
Patient will be able to start Tremfya on or after 07/07/2022 (which is 12 weeks from last Skyrizi dose)  Chesley Mires, PharmD, MPH, BCPS, CPP Clinical Pharmacist (Rheumatology and Pulmonology)

## 2022-05-11 NOTE — Telephone Encounter (Signed)
Patient scheduled for Tremfya re-start appointment. She states she will stop by tomorrow to pick up [facility] sample.  Chesley Mires, PharmD, MPH, BCPS, CPP Clinical Pharmacist (Rheumatology and Pulmonology)

## 2022-05-12 ENCOUNTER — Telehealth: Payer: Self-pay | Admitting: Pharmacist

## 2022-05-12 NOTE — Telephone Encounter (Signed)
Medication Samples have been provided to the patient.  Drug name: OTEZLA starter pack Qty: 27 tabs LOT: [id number] Exp.Date: 01/17/2023  Drug name: Delbert Phenix: 28 tabs x 2 packs LOT: [id number] Exp.Date: 09/17/2023  Chesley Mires, PharmD, MPH, BCPS, CPP Clinical Pharmacist (Rheumatology and Pulmonology)

## 2022-06-06 ENCOUNTER — Ambulatory Visit (INDEPENDENT_AMBULATORY_CARE_PROVIDER_SITE_OTHER): Payer: 59

## 2022-06-06 ENCOUNTER — Other Ambulatory Visit: Payer: Self-pay | Admitting: Rheumatology

## 2022-06-06 VITALS — BP 117/73 | HR 87 | Temp 97.6°F | Resp 18 | Ht 67.0 in | Wt 180.2 lb

## 2022-06-06 DIAGNOSIS — Z9889 Other specified postprocedural states: Secondary | ICD-10-CM

## 2022-06-06 DIAGNOSIS — Z79899 Other long term (current) drug therapy: Secondary | ICD-10-CM

## 2022-06-06 DIAGNOSIS — L4050 Arthropathic psoriasis, unspecified: Secondary | ICD-10-CM

## 2022-06-06 DIAGNOSIS — Z8781 Personal history of (healed) traumatic fracture: Secondary | ICD-10-CM | POA: Diagnosis not present

## 2022-06-06 DIAGNOSIS — M810 Age-related osteoporosis without current pathological fracture: Secondary | ICD-10-CM

## 2022-06-06 DIAGNOSIS — L403 Pustulosis palmaris et plantaris: Secondary | ICD-10-CM

## 2022-06-06 MED ORDER — ROMOSOZUMAB-AQQG 105 MG/1.17ML ~~LOC~~ SOSY
210.0000 mg | PREFILLED_SYRINGE | Freq: Once | SUBCUTANEOUS | Status: AC
  Administered 2022-06-06: 210 mg via SUBCUTANEOUS
  Filled 2022-06-06: qty 2.34

## 2022-06-06 NOTE — Telephone Encounter (Signed)
Patient completed first Evenity today. Added to med list.  Chesley Mires, PharmD, MPH, BCPS, CPP Clinical Pharmacist (Rheumatology and Pulmonology)

## 2022-06-06 NOTE — Patient Instructions (Signed)
Romosozumab Injection What is this medication? ROMOSOZUMAB (roe moe SOZ ue mab) prevents and treats osteoporosis. It works by making your bones stronger and less likely to break (fracture). It is a monoclonal antibody. This medicine may be used for other purposes; ask your health care provider or pharmacist if you have questions. COMMON BRAND NAME(S): EVENITY What should I tell my care team before I take this medication? They need to know if you have any of these conditions: Dental disease Heart attack Heart disease Kidney problems Low levels of calcium in the blood On dialysis Stroke Wear dentures An unusual or allergic reaction to romosozumab, other medications, foods, dyes or preservatives Pregnant or trying to get pregnant Breast-feeding How should I use this medication? This medication is injected under the skin. It is given by your care team in a hospital or clinic setting. A special MedGuide will be given to you by the pharmacist with each prescription and refill. Be sure to read this information carefully each time. Talk to your care team about the use of this medication in children. Special care may be needed. Overdosage: If you think you have taken too much of this medicine contact a poison control center or emergency room at once. NOTE: This medicine is only for you. Do not share this medicine with others. What if I miss a dose? Keep appointments for follow-up doses. It is important not to miss your dose. Call your care team if you are unable to keep an appointment. What may interact with this medication? Interactions are not expected. This list may not describe all possible interactions. Give your health care provider a list of all the medicines, herbs, non-prescription drugs, or dietary supplements you use. Also tell them if you smoke, drink alcohol, or use illegal drugs. Some items may interact with your medicine. What should I watch for while using this medication? Your  condition will be monitored carefully while you are receiving this medication. You may need bloodwork while taking this medication. You should make sure you get enough calcium and vitamin D while you are taking this medication. Discuss the foods you eat and the vitamins you take with your care team. Some people who take this medication have severe bone, joint, or muscle pain. This medication may also increase your risk for jaw problems or a broken thigh bone. Tell your care team right away if you have severe pain in your jaw, bones, joints, or muscles. Tell you care team if you have any pain that does not go away or that gets worse. Tell your dentist and dental surgeon that you are taking this medication. You should not have major dental surgery while on this medication. See your dentist to have a dental exam and fix any dental problems before starting this medication. Take good care of your teeth while on this medication. Make sure you see your dentist for regular follow-up appointments. What side effects may I notice from receiving this medication? Side effects that you should report to your care team as soon as possible: Allergic reactions or angioedema--skin rash, itching or hives, swelling of the face, eyes, lips, tongue, arms, or legs, trouble swallowing or breathing Heart attack--pain or tightness in the chest, shoulders, arms, or jaw, nausea, shortness of breath, cold or clammy skin, feeling faint or lightheaded Low calcium level--muscle pain or cramps, confusion, tingling, or numbness in the hands or feet Osteonecrosis of the jaw--pain, swelling, or redness in the mouth, numbness of the jaw, poor healing after dental work,   unusual discharge from the mouth, visible bones in the mouth Severe bone, joint, or muscle pain Stroke--sudden numbness or weakness of the face, arm, or leg, trouble speaking, confusion, trouble walking, loss of balance or coordination, dizziness, severe headache, change in  vision Side effects that usually do not require medical attention (report to your care team if they continue or are bothersome): Headache Joint pain Muscle spasms Pain, redness, or irritation at injection site Swelling of the ankles, hands, or feet This list may not describe all possible side effects. Call your doctor for medical advice about side effects. You may report side effects to FDA at 1-800-FDA-1088. Where should I keep my medication? This medication is given in a hospital or clinic. It will not be stored at home. NOTE: This sheet is a summary. It may not cover all possible information. If you have questions about this medicine, talk to your doctor, pharmacist, or health care provider.  2023 Elsevier/Gold Standard (2021-02-03 00:00:00)  

## 2022-06-06 NOTE — Progress Notes (Signed)
Diagnosis: Osteoporosis  Provider:  Chilton Greathouse MD  Procedure: Injection  Evenity, Dose: 210 mg, Site: subcutaneous, Number of injections: 2  Post Care: Observation period completed  Discharge: Condition: Good, Destination: Home . AVS Provided  Performed by:  Adriana Mccallum, RN

## 2022-06-15 NOTE — Progress Notes (Signed)
Pharmacy Note  Subjective:   Patient presents to clinic today to receive first dose of Tremfya for psoriatic arthritis and psoriasis overlap. She was previously on Tremfya but switched to [place] which led to worsening of her condition. Her last dose of Skyrizi was on 03/30/22. She has been taking Otelza in the interim so help manage PsA/pso symptoms during transition period.  She has started Evenity for osteoporosis (first dose completed on 06/06/2022). Tolerated without issue.  Patient running a fever or have signs/symptoms of infection? No  Patient currently on antibiotics for the treatment of infection? No  Patient have any upcoming invasive procedures/surgeries? No  Objective: CMP     Component Value Date/Time   NA 138 05/05/2022 1546   NA 141 01/28/2021 1502   K 4.7 05/05/2022 1546   CL 101 05/05/2022 1546   CO2 28 05/05/2022 1546   GLUCOSE 88 05/05/2022 1546   BUN 15 05/05/2022 1546   BUN 14 01/28/2021 1502   CREATININE 0.75 05/05/2022 1546   CALCIUM 9.9 05/05/2022 1546   PROT 7.5 05/05/2022 1546   PROT 7.1 01/28/2021 1502   ALBUMIN 4.5 01/28/2021 1502   AST 20 05/05/2022 1546   ALT 23 05/05/2022 1546   ALKPHOS 141 (H) 01/28/2021 1502   BILITOT 0.3 05/05/2022 1546   BILITOT 0.3 01/28/2021 1502   GFRNONAA >60 02/18/2022 1021   GFRNONAA 80 07/09/2020 1532   GFRAA 92 07/09/2020 1532    CBC    Component Value Date/Time   WBC 7.3 05/05/2022 1546   RBC 4.84 05/05/2022 1546   HGB 14.5 05/05/2022 1546   HGB 14.0 01/28/2021 1502   HCT 42.8 05/05/2022 1546   HCT 40.1 01/28/2021 1502   PLT 445 (H) 05/05/2022 1546   PLT 415 01/28/2021 1502   MCV 88.4 05/05/2022 1546   MCV 87 01/28/2021 1502   MCH 30.0 05/05/2022 1546   MCHC 33.9 05/05/2022 1546   RDW 12.7 05/05/2022 1546   RDW 12.7 01/28/2021 1502   LYMPHSABS 2,643 05/05/2022 1546   LYMPHSABS WILL FOLLOW 06/14/2016 0832   MONOABS 0.7 12/11/2019 0847   EOSABS 110 05/05/2022 1546   EOSABS WILL FOLLOW 06/14/2016  0832   BASOSABS 29 05/05/2022 1546   BASOSABS WILL FOLLOW 06/14/2016 0832    Baseline Immunosuppressant Therapy Labs TB GOLD    Latest Ref Rng & Units 09/08/2021    2:09 PM  Quantiferon TB Gold  Quantiferon TB Gold Plus NEGATIVE NEGATIVE    Hepatitis Panel    Latest Ref Rng & Units 02/14/2018    9:59 AM  Hepatitis  Hep B Surface Ag NON-REACTI NON-REACTIVE   Hep B IgM NON-REACTI NON-REACTIVE   Hep C Ab NON-REACTI NON-REACTIVE    HIV Lab Results  Component Value Date   HIV NON-REACTIVE 07/09/2020   Immunoglobulins    Latest Ref Rng & Units 07/09/2020    3:32 PM  Immunoglobulin Electrophoresis  IgA  70 - 320 mg/dL 409   IgG 811 - 9,147 mg/dL 8,295   IgM 50 - 621 mg/dL 308    SPEP    Latest Ref Rng & Units 05/05/2022    3:46 PM  Serum Protein Electrophoresis  Total Protein 6.1 - 8.1 g/dL 7.5    Chest x-ray: 06/21/76 - No acute cardiopulmonary disease  Assessment/Plan:  Reviewed importance of holding TREMFYA with signs/symptoms of an infections, if antibiotics are prescribed to treat an active infection, and with invasive procedures  Demonstrated proper injection technique with TREMFYA demo device  Patient able  to demonstrate proper injection technique using the teach back method.  Patient self injected in the right upper thigh with:  Sample Medication: Tremfya autoinjector 100mg /mL NDC: [address]-40 Lot: [id number] Expiration: 05/2023  Patient tolerated well.  Observed for 30 mins in office for adverse reaction and none noted. Patient denies itchiness and irritation. No swelling or redness noted at injection site.   Patient is to return in 1 month for labs and 6-8 weeks for follow-up appointment.  Standing orders for CBC/CMP placed.  TB gold will be monitored yearly.   TREMFYA re-approved through insurance .   Rx sent to: [facility]: [phone] .  Patient provided with pharmacy phone number and advised to call later this week to schedule shipment to  home.  For PsA-pso - Patient will continue Tremfya 100mg  subcut at Week 0 (administered in clinic today), Week 4, Week 8, then every 8 weeks  For osteoporosis, she will continue Evenity every month x 12 months total treatment duration. She receives this at [facility].  All questions encouraged and answered.  Instructed patient to call with any further questions or concerns.  Chesley Mires, PharmD, MPH, BCPS, CPP Clinical Pharmacist (Rheumatology and Pulmonology)  06/15/2022 11:42 AM

## 2022-06-15 NOTE — Patient Instructions (Addendum)
Your next TREMFYA dose is due on 07/19/2022, 08/16/2022, and every 8 weeks thereafter (starting on 10/11/22)  HOLD TREMFYA if you have signs or symptoms of an infection. You can resume once you feel better or back to your baseline. HOLD TREMFYA if you start antibiotics to treat an infection. HOLD TREMFYA around the time of surgery/procedures. Your surgeon will be able to provide recommendations on when to hold BEFORE and when you are cleared to RESUME.  Pharmacy information: Your prescription will be shipped from [facility]. Their phone number is [phone] Please call to schedule shipment and confirm address. They will mail your medication to your home.  Cost information: Your copay should be affordable. If you call the pharmacy and it is not affordable, please double-check that they are billing through your copay card as secondary coverage. That copay card information is: BIN: [id number] Group: [id number] ID: [id number]  Labs are due in 1 month then every 3 months. Lab hours are from Monday to Thursday 8am-12:30pm and 1pm-5pm and Friday 8am-12pm. You do not need an appointment if you come for labs during these times.  How to manage an injection site reaction: Remember the 5 C's: COUNTER - leave on the counter at least 30 minutes but up to overnight to bring medication to room temperature. This may help prevent stinging COLD - place something cold (like an ice gel pack or cold water bottle) on the injection site just before cleansing with alcohol. This may help reduce pain CLARITIN - use Claritin (generic name is loratadine) for the first two weeks of treatment or the day of, the day before, and the day after injecting. This will help to minimize injection site reactions CORTISONE CREAM - apply if injection site is irritated and itching CALL ME - if injection site reaction is bigger than the size of your fist, looks infected, blisters, or if you develop hives

## 2022-06-21 ENCOUNTER — Ambulatory Visit: Payer: 59 | Attending: Physician Assistant | Admitting: Pharmacist

## 2022-06-21 DIAGNOSIS — Z79899 Other long term (current) drug therapy: Secondary | ICD-10-CM

## 2022-06-21 DIAGNOSIS — L403 Pustulosis palmaris et plantaris: Secondary | ICD-10-CM

## 2022-06-21 DIAGNOSIS — L4050 Arthropathic psoriasis, unspecified: Secondary | ICD-10-CM

## 2022-06-21 DIAGNOSIS — Z7189 Other specified counseling: Secondary | ICD-10-CM

## 2022-06-21 MED ORDER — TREMFYA 100 MG/ML ~~LOC~~ SOAJ
SUBCUTANEOUS | 1 refills | Status: DC
Start: 2022-06-21 — End: 2022-08-22

## 2022-07-04 ENCOUNTER — Ambulatory Visit (INDEPENDENT_AMBULATORY_CARE_PROVIDER_SITE_OTHER): Payer: 59

## 2022-07-04 VITALS — BP 133/68 | HR 109 | Temp 98.1°F | Resp 18 | Ht 67.0 in | Wt 179.8 lb

## 2022-07-04 DIAGNOSIS — Z9889 Other specified postprocedural states: Secondary | ICD-10-CM

## 2022-07-04 DIAGNOSIS — Z8781 Personal history of (healed) traumatic fracture: Secondary | ICD-10-CM

## 2022-07-04 DIAGNOSIS — M810 Age-related osteoporosis without current pathological fracture: Secondary | ICD-10-CM

## 2022-07-04 MED ORDER — ROMOSOZUMAB-AQQG 105 MG/1.17ML ~~LOC~~ SOSY
210.0000 mg | PREFILLED_SYRINGE | Freq: Once | SUBCUTANEOUS | Status: AC
  Administered 2022-07-04: 210 mg via SUBCUTANEOUS
  Filled 2022-07-04: qty 2.34

## 2022-07-04 NOTE — Progress Notes (Signed)
Diagnosis: Osteoporosis  Provider:  Mannam, Praveen MD  Procedure: Injection  Evenity (Romosozumab-aqqg), Dose: 210 mg, Site: subcutaneous, Number of injections: 2  Post Care: Patient declined observation  Discharge: Condition: Good, Destination: Home . AVS Declined  Performed by:  Chistina Roston, RN       

## 2022-07-06 ENCOUNTER — Ambulatory Visit (INDEPENDENT_AMBULATORY_CARE_PROVIDER_SITE_OTHER): Payer: 59 | Admitting: Nurse Practitioner

## 2022-07-06 ENCOUNTER — Encounter: Payer: Self-pay | Admitting: Nurse Practitioner

## 2022-07-06 VITALS — BP 119/73 | HR 88 | Ht 67.0 in | Wt 178.1 lb

## 2022-07-06 DIAGNOSIS — Z6827 Body mass index (BMI) 27.0-27.9, adult: Secondary | ICD-10-CM

## 2022-07-06 DIAGNOSIS — R635 Abnormal weight gain: Secondary | ICD-10-CM | POA: Insufficient documentation

## 2022-07-06 MED ORDER — PHENTERMINE HCL 37.5 MG PO TABS
37.5000 mg | ORAL_TABLET | Freq: Every day | ORAL | 1 refills | Status: DC
Start: 2022-07-06 — End: 2022-09-09

## 2022-07-06 NOTE — Assessment & Plan Note (Signed)
Struggling with losing recent weight gain -new trail phentermine 37.5 mg daily.  -Continue with low calorie, high-protein diet. Incorporate exercise into daily activities.   -goal weight is between 165 and 170  -reassess in 2 to 3 months

## 2022-07-06 NOTE — Assessment & Plan Note (Signed)
Struggling with losing recent weight gain -new trail phentermine 37.5 mg daily.  -Continue with low calorie, high-protein diet. Incorporate exercise into daily activities.   -goal weight is between 165 and 170  -reassess in 2 to 3 months  

## 2022-07-06 NOTE — Progress Notes (Signed)
Established patient visit   Patient: Ann Newton   DOB: 1955-06-20   67 y.o. Female  MRN: [id number] Visit Date: 07/06/2022   Chief Complaint  Patient presents with   New Med Request   Subjective    HPI  Follow up Discuss weight management Today's weight (starting weight) 07/06/2022 - 178 She has a goal weight of below 170. States that she is most comfortable between 165 and 170.  Has reached a plateau.  Her son is getting married on 09/18/2022 and would like to get under 170 prior to then  She has been on phentermine in the past.  No negative side effects associated with taking this medication.   -She denies chest pain, chest pressure, or shortness of breath. she denies headaches or visual disturbances. She denies abdominal pain, nausea, vomiting, or changes in bowel or bladder habits.    Medications: Outpatient Medications Prior to Visit  Medication Sig   acyclovir (ZOVIRAX) 400 MG tablet Take 1 tablet po TID for 5 days. Begin taking at first sign of fever blister (Patient taking differently: Take 400 mg by mouth 3 (three) times daily as needed (fever Blisters). 5 days. Begin taking at first sign of fever blister)   calcipotriene (DOVONOX) 0.005 % ointment APPLY TO AFFECTED AREA ON THE SKIN DAILY FOR 2 WEEKS. (Patient taking differently: Apply 1 application  topically daily as needed (Psoriasis).)   clobetasol ointment (TEMOVATE) 0.05 % Apply to affected area up to two times daily for up to two weeks. (Patient taking differently: Apply 1 Application topically daily as needed (Psoriasis).)   diclofenac sodium (VOLTAREN) 1 % GEL 3 grams to 3 large joints up to 3 times daily (Patient taking differently: Apply 2 g topically daily as needed (Arthritis).)   EFUDEX 5 % cream Apply 1 application  topically daily as needed (Psoriasis).   Guselkumab (TREMFYA) 100 MG/ML SOPN Inject 100mg  into the skin at Week 4 then every 8 weeks. (Week 0 dose received in clinic on 06/21/22)    hydrochlorothiazide (HYDRODIURIL) 12.5 MG tablet Take 1 tablet (12.5 mg total) by mouth daily. (Patient taking differently: Take 12.5 mg by mouth daily as needed (Fluid).)   Multiple Vitamins-Minerals (CENTRUM SILVER 50+WOMEN) TABS Take 1 tablet by mouth daily.   Romosozumab-aqqg (EVENITY) 105 MG/1. SOSY injection Inject 210 mg into the skin every 30 (thirty) days.   [DISCONTINUED] ibuprofen (ADVIL) 600 MG tablet Take 1 tablet (600 mg total) by mouth every 6 (six) hours as needed. (Patient taking differently: Take 600 mg by mouth every 6 (six) hours as needed for moderate pain or mild pain.)   No facility-administered medications prior to visit.    Review of Systems See HPI    Last CBC Lab Results  Component Value Date   WBC 7.3 05/05/2022   HGB 14.5 05/05/2022   HCT 42.8 05/05/2022   MCV 88.4 05/05/2022   MCH 30.0 05/05/2022   RDW 12.7 05/05/2022   PLT 445 (H) 05/05/2022   Last metabolic panel Lab Results  Component Value Date   GLUCOSE 88 05/05/2022   NA 138 05/05/2022   K 4.7 05/05/2022   CL 101 05/05/2022   CO2 28 05/05/2022   BUN 15 05/05/2022   CREATININE 0.75 05/05/2022   EGFR 88 05/05/2022   CALCIUM 9.9 05/05/2022   PROT 7.5 05/05/2022   ALBUMIN 4.5 01/28/2021   LABGLOB 2.6 01/28/2021   AGRATIO 1.7 01/28/2021   BILITOT 0.3 05/05/2022   ALKPHOS 141 (H) 01/28/2021   AST 20  05/05/2022   ALT 23 05/05/2022   ANIONGAP 10 02/18/2022   Last lipids Lab Results  Component Value Date   CHOL 325 (H) 01/28/2021   HDL 51 01/28/2021   LDLCALC 232 (H) 01/28/2021   LDLDIRECT 161.0 05/29/2019   TRIG 211 (H) 01/28/2021   CHOLHDL 6.4 (H) 01/28/2021   Last hemoglobin A1c Lab Results  Component Value Date   HGBA1C 5.5 01/28/2021   Last thyroid functions Lab Results  Component Value Date   TSH 1.040 01/28/2021       Objective     Today's Vitals   07/06/22 1016 07/06/22 1236  BP: 119/73   Pulse: (Abnormal) 104 88  SpO2: 100%   Weight: 178 lb 1.9 oz (80.8  kg)   Height: 5\' 7"  (1.702 m)    Body mass index is 27.9 kg/m.  BP Readings from Last 3 Encounters:  07/06/22 119/73  07/04/22 133/68  06/06/22 117/73    Wt Readings from Last 3 Encounters:  07/06/22 178 lb 1.9 oz (80.8 kg)  07/04/22 179 lb 12.8 oz (81.6 kg)  06/06/22 180 lb 3.2 oz (81.7 kg)    Physical Exam Vitals and nursing note reviewed.  Constitutional:      Appearance: Normal appearance. She is well-developed.  HENT:     Head: Normocephalic and atraumatic.     Nose: Nose normal.     Mouth/Throat:     Mouth: Mucous membranes are moist.     Pharynx: Oropharynx is clear.  Eyes:     Extraocular Movements: Extraocular movements intact.     Conjunctiva/sclera: Conjunctivae normal.     Pupils: Pupils are equal, round, and reactive to light.  Neck:     Vascular: No carotid bruit.  Cardiovascular:     Rate and Rhythm: Normal rate and regular rhythm.     Pulses: Normal pulses.     Heart sounds: Normal heart sounds.  Pulmonary:     Effort: Pulmonary effort is normal.     Breath sounds: Normal breath sounds.  Abdominal:     Palpations: Abdomen is soft.  Musculoskeletal:        General: Normal range of motion.     Cervical back: Normal range of motion and neck supple.  Lymphadenopathy:     Cervical: No cervical adenopathy.  Skin:    General: Skin is warm and dry.     Capillary Refill: Capillary refill takes less than 2 seconds.  Neurological:     General: No focal deficit present.     Mental Status: She is alert and oriented to person, place, and time.  Psychiatric:        Mood and Affect: Mood normal.        Behavior: Behavior normal.        Thought Content: Thought content normal.        Judgment: Judgment normal.      Assessment & Plan    Abnormal weight gain Assessment & Plan: Struggling with losing recent weight gain -new trail phentermine 37.5 mg daily.  -Continue with low calorie, high-protein diet. Incorporate exercise into daily activities.   -goal  weight is between 165 and 170  -reassess in 2 to 3 months    BMI 27.0-27.9,adult Assessment & Plan: Struggling with losing recent weight gain -new trail phentermine 37.5 mg daily.  -Continue with low calorie, high-protein diet. Incorporate exercise into daily activities.   -goal weight is between 165 and 170  -reassess in 2 to 3 months   Orders: -  Phentermine HCl; Take 1 tablet (37.5 mg total) by mouth daily before breakfast.  Dispense: 30 tablet; Refill: 1     Return in about 3 months (around 10/06/2022) for routine - weight management.         Carlean Jews, NP  [facility] [phone] (phone) [phone] (fax)  [facility]

## 2022-07-07 ENCOUNTER — Ambulatory Visit: Payer: 59 | Admitting: Pharmacist

## 2022-07-18 NOTE — Progress Notes (Deleted)
Office Visit Note  Patient: Ann Newton             Date of Birth: 09-21-1955           MRN: [id number]             PCP: Carlean Jews, NP Referring: Carlean Jews, NP Visit Date: 08/01/2022 Occupation: @GUAROCC @  Subjective:  Medication monitoring   History of Present Illness: Ann Newton is a 67 y.o. female with history of psoriatic arthritis and osteoporosis.  Patient remains on Tremfya 100 mg sq injections every 8 weeks.   CBC and CMP updated on 05/05/22. Orders for CBC and CMP released today.  TB gold negative on 09/08/21.  Discussed the importance of holding tremfya if she develops signs or symptoms of an infection and to resume once the infection has completely cleared.   Evenity?   Activities of Daily Living:  Patient reports morning stiffness for *** {minute/hour:19697}.   Patient {ACTIONS;DENIES/REPORTS:21021675::"Denies"} nocturnal pain.  Difficulty dressing/grooming: {ACTIONS;DENIES/REPORTS:21021675::"Denies"} Difficulty climbing stairs: {ACTIONS;DENIES/REPORTS:21021675::"Denies"} Difficulty getting out of chair: {ACTIONS;DENIES/REPORTS:21021675::"Denies"} Difficulty using hands for taps, buttons, cutlery, and/or writing: {ACTIONS;DENIES/REPORTS:21021675::"Denies"}  No Rheumatology ROS completed.   PMFS History:  Patient Active Problem List   Diagnosis Date Noted   Abnormal weight gain 07/06/2022   Age-related osteoporosis without current pathological fracture 05/06/2022   History of kyphoplasty 05/06/2022   History of vertebral fracture 05/06/2022   Atopic dermatitis 07/26/2021   Elevated blood pressure reading 05/02/2021   Acute non-recurrent pansinusitis 04/07/2021   Weight loss 02/25/2021   Fever blister 02/04/2021   Edema 02/04/2021   Thyroid nodule greater than or equal to 1 cm in diameter incidentally noted on imaging study 11/20/2018   Fatigue 11/09/2018   Elevated glucose 11/09/2018   Snores 11/09/2018   Hair loss 11/09/2018    Need for influenza vaccination 11/09/2018   Thyromegaly 11/09/2018   Allergic reaction 05/17/2018   Maculopapular rash 05/17/2018   Family history of rheumatoid arthritis 03/09/2018   History of vitamin D deficiency 03/09/2018   Primary osteoarthritis of both knees 02/22/2018   Primary osteoarthritis of both hands 02/22/2018   Neuropathy 11/22/2017   Chronic pain of both knees 11/22/2017   Need for shingles vaccine 11/22/2017   Vitamin D deficiency 01/24/2016   Smoking hx- ( less 1 ppd * 15 yrs) - Quit Jan 17 2013 12/28/2015   Elevated LDL cholesterol level 12/28/2015   Elevated vitamin B12 level 12/28/2015   h/o Mild peripheral edema- takes HCTZ for this 12/20/2015   Dietary B12 deficiency 12/20/2015   History of chickenpox ( Aug '17 )  12/20/2015   Healthcare maintenance 12/20/2015   BMI 27.0-27.9,adult 12/03/2015   h/o Hypertriglyceridemia 12/03/2015   Mixed hyperlipidemia 12/03/2015   Pustular psoriasis of palms and soles 10/28/2010    Past Medical History:  Diagnosis Date   Arthritis    Atopic dermatitis    Complication of anesthesia    woke up early during colonoscopy and dental implant   Fever blister    Hyperlipidemia    diet controlled   Psoriasis    Psoriatic arthritis (HCC)    Thyroid disease    as a child, no problems as an adult   Wears contact lenses     Family History  Problem Relation Age of Onset   Kidney disease Mother    Diabetes Mother    Heart attack Father    Rheum arthritis Brother    Healthy Daughter    Healthy Son  Heart attack Maternal Grandmother    Heart attack Maternal Grandfather    Cancer Paternal Grandmother    Heart attack Paternal Grandfather    Rheum arthritis Brother    Colon cancer Neg Hx    Colon polyps Neg Hx    Esophageal cancer Neg Hx    Rectal cancer Neg Hx    Stomach cancer Neg Hx    Past Surgical History:  Procedure Laterality Date   ABDOMINAL HYSTERECTOMY  2000   part   ANTERIOR AND POSTERIOR REPAIR N/A  09/08/2016   Procedure: ANTERIOR (CYSTOCELE) AND POSTERIOR REPAIR (RECTOCELE)/Perineorrhaphy;  Surgeon: Olivia Mackie, MD;  Location: [facility];  Service: Gynecology;  Laterality: N/A;  Requests . Repair of enterocele/posterior   COLONOSCOPY     ~12 yrs ago- normal per pt    COSMETIC SURGERY  1999   breast implants   dental implants     with sedation   HAGLAND'S DEFORMITY EXCISION Left 09/13/2013   Procedure: LEFT FOOT: EXCISION INTERDIGITAL MORTON'S NEUROMA SINGLE EACH;  Surgeon: Thera Flake., MD;  Location: [facility];  Service: Orthopedics;  Laterality: Left;   KNEE ARTHROSCOPY  2013,2011   right/left   KYPHOPLASTY N/A 02/18/2022   Procedure: THORACIC NINE, THORACIC TEN KYPHOPLASTY;  Surgeon: Coletta Memos, MD;  Location: [facility];  Service: Neurosurgery;  Laterality: N/A;  RM 19   SHOULDER ARTHROSCOPY     rt   TUBAL LIGATION     WISDOM TOOTH EXTRACTION     Social History   Social History Narrative   Right handed    Immunization History  Administered Date(s) Administered   Influenza,inj,Quad PF,6+ Mos 12/03/2015, 11/22/2017, 11/09/2018, 12/11/2019, 02/02/2021   Moderna Sars-Covid-2 Vaccination 08/13/2019, 08/19/2019, 09/16/2019   Pneumococcal Polysaccharide-23 11/22/2017   Tdap 02/09/2009, 05/29/2019   Zoster Recombinant(Shingrix) 11/09/2018, 05/29/2019     Objective: Vital Signs: There were no vitals taken for this visit.   Physical Exam Vitals and nursing note reviewed.  Constitutional:      Appearance: She is well-developed.  HENT:     Head: Normocephalic and atraumatic.  Eyes:     Conjunctiva/sclera: Conjunctivae normal.  Cardiovascular:     Rate and Rhythm: Normal rate and regular rhythm.     Heart sounds: Normal heart sounds.  Pulmonary:     Effort: Pulmonary effort is normal.     Breath sounds: Normal breath sounds.  Abdominal:     General: Bowel sounds are normal.     Palpations: Abdomen is soft.  Musculoskeletal:     Cervical back:  Normal range of motion.  Lymphadenopathy:     Cervical: No cervical adenopathy.  Skin:    General: Skin is warm and dry.     Capillary Refill: Capillary refill takes less than 2 seconds.  Neurological:     Mental Status: She is alert and oriented to person, place, and time.  Psychiatric:        Behavior: Behavior normal.      Musculoskeletal Exam: ***  CDAI Exam: CDAI Score: -- Patient Global: --; Provider Global: -- Swollen: --; Tender: -- Joint Exam 08/01/2022   No joint exam has been documented for this visit   There is currently no information documented on the homunculus. Go to the Rheumatology activity and complete the homunculus joint exam.  Investigation: No additional findings.  Imaging: No results found.  Recent Labs: Lab Results  Component Value Date   WBC 7.3 05/05/2022   HGB 14.5 05/05/2022   PLT 445 (H) 05/05/2022  NA 138 05/05/2022   K 4.7 05/05/2022   CL 101 05/05/2022   CO2 28 05/05/2022   GLUCOSE 88 05/05/2022   BUN 15 05/05/2022   CREATININE 0.75 05/05/2022   BILITOT 0.3 05/05/2022   ALKPHOS 141 (H) 01/28/2021   AST 20 05/05/2022   ALT 23 05/05/2022   PROT 7.5 05/05/2022   ALBUMIN 4.5 01/28/2021   CALCIUM 9.9 05/05/2022   GFRAA 92 07/09/2020   QFTBGOLDPLUS NEGATIVE 09/08/2021    Speciality Comments: Otezla stopped 08/01/20 Tremfya started 08/03/20 - stopped 08/10/21 Cosentyx started 10/06/21- stopped 12/01/21; Skyrizi started 12/30/21  Procedures:  No procedures performed Allergies: Patient has no known allergies.   Assessment / Plan:     Visit Diagnoses: Psoriatic arthropathy (HCC)  Pustular psoriasis of palms and soles  High risk medication use  Age-related osteoporosis without current pathological fracture  History of kyphoplasty  History of vertebral fracture  History of vitamin D deficiency  Primary osteoarthritis of both hands  Primary osteoarthritis of both hips  Primary osteoarthritis of both  knees  Paresthesia of both feet  Family history of rheumatoid arthritis  h/o Hypertriglyceridemia  Smoking hx- ( less 1 ppd * 15 yrs) - Quit Jan 17 2013  Neuropathy  Orders: No orders of the defined types were placed in this encounter.  No orders of the defined types were placed in this encounter.   Face-to-face time spent with patient was *** minutes. Greater than 50% of time was spent in counseling and coordination of care.  Follow-Up Instructions: No follow-ups on file.   Gearldine Bienenstock, PA-C  Note - This record has been created using Dragon software.  Chart creation errors have been sought, but may not always  have been located. Such creation errors do not reflect on  the standard of medical care.

## 2022-08-01 ENCOUNTER — Ambulatory Visit: Payer: 59

## 2022-08-01 ENCOUNTER — Ambulatory Visit: Payer: 59 | Admitting: Physician Assistant

## 2022-08-01 VITALS — BP 122/76 | HR 87 | Temp 98.5°F | Resp 16 | Ht 67.0 in | Wt 174.8 lb

## 2022-08-01 DIAGNOSIS — R202 Paresthesia of skin: Secondary | ICD-10-CM

## 2022-08-01 DIAGNOSIS — Z87891 Personal history of nicotine dependence: Secondary | ICD-10-CM

## 2022-08-01 DIAGNOSIS — Z8781 Personal history of (healed) traumatic fracture: Secondary | ICD-10-CM

## 2022-08-01 DIAGNOSIS — M19041 Primary osteoarthritis, right hand: Secondary | ICD-10-CM

## 2022-08-01 DIAGNOSIS — Z8261 Family history of arthritis: Secondary | ICD-10-CM

## 2022-08-01 DIAGNOSIS — Z9889 Other specified postprocedural states: Secondary | ICD-10-CM

## 2022-08-01 DIAGNOSIS — L403 Pustulosis palmaris et plantaris: Secondary | ICD-10-CM

## 2022-08-01 DIAGNOSIS — Z79899 Other long term (current) drug therapy: Secondary | ICD-10-CM

## 2022-08-01 DIAGNOSIS — E781 Pure hyperglyceridemia: Secondary | ICD-10-CM

## 2022-08-01 DIAGNOSIS — M160 Bilateral primary osteoarthritis of hip: Secondary | ICD-10-CM

## 2022-08-01 DIAGNOSIS — M810 Age-related osteoporosis without current pathological fracture: Secondary | ICD-10-CM

## 2022-08-01 DIAGNOSIS — L4050 Arthropathic psoriasis, unspecified: Secondary | ICD-10-CM

## 2022-08-01 DIAGNOSIS — Z8639 Personal history of other endocrine, nutritional and metabolic disease: Secondary | ICD-10-CM

## 2022-08-01 DIAGNOSIS — M170 Bilateral primary osteoarthritis of knee: Secondary | ICD-10-CM

## 2022-08-01 DIAGNOSIS — G629 Polyneuropathy, unspecified: Secondary | ICD-10-CM

## 2022-08-01 MED ORDER — ROMOSOZUMAB-AQQG 105 MG/1.17ML ~~LOC~~ SOSY
210.0000 mg | PREFILLED_SYRINGE | Freq: Once | SUBCUTANEOUS | Status: AC
  Administered 2022-08-01: 210 mg via SUBCUTANEOUS

## 2022-08-01 NOTE — Progress Notes (Signed)
Diagnosis: Osteoporosis  Provider:  Chilton Greathouse MD  Procedure: Injection  Evenity (Romosozumab-aqqg), Dose: 210 mg, Site: subcutaneous, Number of injections: 2  Right arm 105 and left arm 105  Post Care: Patient declined observation.  Discharge: Condition: Good, Destination: Home . AVS Declined  Performed by:  Rico Ala, LPN

## 2022-08-08 ENCOUNTER — Other Ambulatory Visit: Payer: Self-pay | Admitting: Physician Assistant

## 2022-08-08 NOTE — Telephone Encounter (Signed)
Last Fill: 12/01/2021  Next Visit: Due July 2024. Message sent to the front to schedule.   Last Visit: 05/05/2022  Dx: Pustular psoriasis of palms and soles   Current Dose per office note on 05/05/2022: not discussed   Okay to refill Clobetasol Cream?

## 2022-08-08 NOTE — Telephone Encounter (Signed)
Please schedule patient a follow up visit. Patient due July 2024. Thanks!  

## 2022-08-10 NOTE — Progress Notes (Unsigned)
Office Visit Note  Patient: Ann Newton             Date of Birth: 1955-09-10           MRN: [id number]             PCP: Carlean Jews, NP Referring: Carlean Jews, NP Visit Date: 08/17/2022 Occupation: @GUAROCC @  Subjective:  Medication monitoring   History of Present Illness: Ann Newton is a 67 y.o. female with history of psoriatic arthritis and osteoporosis.  Patient was restarted on Tremfya on 06/21/2022.  She has completed the loading dose and her last dose was administered yesterday.  She is tolerating Tremfya without any side effects or injection site reactions.  Patient has noticed a 90% improvement in her symptoms since restarting tremfya.  The palmar psoriasis has cleared and the plantar psoriasis is improving.  She denies any joint swelling at this time.  She continues to have discomfort in both knee joints due to underlying osteoarthritis.  She recently completed Visco gel injections for both knees performed by her orthopedist.  She denies any Achilles tendinitis or plantar fasciitis.  She denies any SI joint pain. Patient is currently on Evenity monthly injections for osteoporosis management.  She is also taking a calcium vitamin D supplement daily.  She has not noticed any side effects with Evenity thus far.    Activities of Daily Living:  Patient reports morning stiffness for 0 minutes.   Patient Reports nocturnal pain.  Difficulty dressing/grooming: Denies Difficulty climbing stairs: Reports Difficulty getting out of chair: Denies Difficulty using hands for taps, buttons, cutlery, and/or writing: Denies  Review of Systems  Constitutional:  Negative for fatigue.  HENT:  Negative for mouth sores and mouth dryness.   Eyes:  Negative for dryness.  Respiratory:  Negative for shortness of breath.   Cardiovascular:  Negative for chest pain and palpitations.  Gastrointestinal:  Negative for blood in stool, constipation and diarrhea.  Endocrine: Negative for  increased urination.  Genitourinary:  Negative for involuntary urination.  Musculoskeletal:  Positive for joint pain, gait problem and joint pain. Negative for joint swelling, myalgias, muscle weakness, morning stiffness, muscle tenderness and myalgias.  Skin:  Positive for rash. Negative for color change, hair loss and sensitivity to sunlight.  Allergic/Immunologic: Negative for susceptible to infections.  Neurological:  Negative for dizziness and headaches.  Hematological:  Negative for swollen glands.  Psychiatric/Behavioral:  Negative for depressed mood and sleep disturbance. The patient is not nervous/anxious.     PMFS History:  Patient Active Problem List   Diagnosis Date Noted   Abnormal weight gain 07/06/2022   Age-related osteoporosis without current pathological fracture 05/06/2022   History of kyphoplasty 05/06/2022   History of vertebral fracture 05/06/2022   Atopic dermatitis 07/26/2021   Elevated blood pressure reading 05/02/2021   Acute non-recurrent pansinusitis 04/07/2021   Weight loss 02/25/2021   Fever blister 02/04/2021   Edema 02/04/2021   Thyroid nodule greater than or equal to 1 cm in diameter incidentally noted on imaging study 11/20/2018   Fatigue 11/09/2018   Elevated glucose 11/09/2018   Snores 11/09/2018   Hair loss 11/09/2018   Need for influenza vaccination 11/09/2018   Thyromegaly 11/09/2018   Allergic reaction 05/17/2018   Maculopapular rash 05/17/2018   Family history of rheumatoid arthritis 03/09/2018   History of vitamin D deficiency 03/09/2018   Primary osteoarthritis of both knees 02/22/2018   Primary osteoarthritis of both hands 02/22/2018   Neuropathy 11/22/2017  Chronic pain of both knees 11/22/2017   Need for shingles vaccine 11/22/2017   Vitamin D deficiency 01/24/2016   Smoking hx- ( less 1 ppd * 15 yrs) - Quit Jan 17 2013 12/28/2015   Elevated LDL cholesterol level 12/28/2015   Elevated vitamin B12 level 12/28/2015   h/o Mild  peripheral edema- takes HCTZ for this 12/20/2015   Dietary B12 deficiency 12/20/2015   History of chickenpox ( Aug '17 )  12/20/2015   Healthcare maintenance 12/20/2015   BMI 27.0-27.9,adult 12/03/2015   h/o Hypertriglyceridemia 12/03/2015   Mixed hyperlipidemia 12/03/2015   Pustular psoriasis of palms and soles 10/28/2010    Past Medical History:  Diagnosis Date   Arthritis    Atopic dermatitis    Complication of anesthesia    woke up early during colonoscopy and dental implant   Fever blister    Hyperlipidemia    diet controlled   Psoriasis    Psoriatic arthritis (HCC)    Thyroid disease    as a child, no problems as an adult   Wears contact lenses     Family History  Problem Relation Age of Onset   Kidney disease Mother    Diabetes Mother    Heart attack Father    Rheum arthritis Brother    Rheum arthritis Brother    Heart attack Maternal Grandmother    Heart attack Maternal Grandfather    Cancer Paternal Grandmother    Heart attack Paternal Grandfather    Healthy Daughter    Healthy Son    Colon cancer Neg Hx    Colon polyps Neg Hx    Esophageal cancer Neg Hx    Rectal cancer Neg Hx    Stomach cancer Neg Hx    Past Surgical History:  Procedure Laterality Date   ABDOMINAL HYSTERECTOMY  2000   part   ANTERIOR AND POSTERIOR REPAIR N/A 09/08/2016   Procedure: ANTERIOR (CYSTOCELE) AND POSTERIOR REPAIR (RECTOCELE)/Perineorrhaphy;  Surgeon: Olivia Mackie, MD;  Location: [facility];  Service: Gynecology;  Laterality: N/A;  Requests . Repair of enterocele/posterior   COLONOSCOPY     ~12 yrs ago- normal per pt    COSMETIC SURGERY  1999   breast implants   dental implants     with sedation   HAGLAND'S DEFORMITY EXCISION Left 09/13/2013   Procedure: LEFT FOOT: EXCISION INTERDIGITAL MORTON'S NEUROMA SINGLE EACH;  Surgeon: Thera Flake., MD;  Location: [facility];  Service: Orthopedics;  Laterality: Left;   KNEE ARTHROSCOPY  2013,2011   right/left    KYPHOPLASTY N/A 02/18/2022   Procedure: THORACIC NINE, THORACIC TEN KYPHOPLASTY;  Surgeon: Coletta Memos, MD;  Location: [facility];  Service: Neurosurgery;  Laterality: N/A;  RM 19   SHOULDER ARTHROSCOPY     rt   TUBAL LIGATION     WISDOM TOOTH EXTRACTION     Social History   Social History Narrative   Right handed    Immunization History  Administered Date(s) Administered   Influenza,inj,Quad PF,6+ Mos 12/03/2015, 11/22/2017, 11/09/2018, 12/11/2019, 02/02/2021   Moderna Sars-Covid-2 Vaccination 08/13/2019, 08/19/2019, 09/16/2019   Pneumococcal Polysaccharide-23 11/22/2017   Tdap 02/09/2009, 05/29/2019   Zoster Recombinant(Shingrix) 11/09/2018, 05/29/2019     Objective: Vital Signs: BP 124/74 (BP Location: Left Arm, Patient Position: Sitting, Cuff Size: Normal)   Pulse (!) 112   Resp 15   Ht 5\' 7"  (1.702 m)   Wt 173 lb 6.4 oz (78.7 kg)   BMI 27.16 kg/m    Physical Exam Vitals and  nursing note reviewed.  Constitutional:      Appearance: She is well-developed.  HENT:     Head: Normocephalic and atraumatic.  Eyes:     Conjunctiva/sclera: Conjunctivae normal.  Cardiovascular:     Rate and Rhythm: Normal rate and regular rhythm.     Heart sounds: Normal heart sounds.  Pulmonary:     Effort: Pulmonary effort is normal.     Breath sounds: Normal breath sounds.  Abdominal:     General: Bowel sounds are normal.     Palpations: Abdomen is soft.  Musculoskeletal:     Cervical back: Normal range of motion.  Skin:    General: Skin is warm and dry.     Capillary Refill: Capillary refill takes less than 2 seconds.     Comments: Mild scaling and erythema noted on plantar aspect of both feet-improving.  No open pustules noted.    Neurological:     Mental Status: She is alert and oriented to person, place, and time.  Psychiatric:        Behavior: Behavior normal.     Musculoskeletal Exam: C-spine, thoracic spine, lumbar spine have good range of motion.  No midline spinal  tenderness.  No SI joint tenderness.  Shoulder joints, elbow joints, wrist joints, MCPs, PIPs, DIPs have good range of motion with no synovitis.  Complete fist formation bilaterally.  Hip joints have good range of motion with no groin pain.  Knee joints have good range of motion with no warmth or effusion.  Bilateral knee crepitus noted. Ankle joints have good range of motion with no tenderness or joint swelling.  No evidence of achilles tendonitis or plantar fasciitis.   CDAI Exam: CDAI Score: -- Patient Global: --; Provider Global: -- Swollen: --; Tender: -- Joint Exam 08/17/2022   No joint exam has been documented for this visit   There is currently no information documented on the homunculus. Go to the Rheumatology activity and complete the homunculus joint exam.  Investigation: No additional findings.  Imaging: No results found.  Recent Labs: Lab Results  Component Value Date   WBC 7.3 05/05/2022   HGB 14.5 05/05/2022   PLT 445 (H) 05/05/2022   NA 138 05/05/2022   K 4.7 05/05/2022   CL 101 05/05/2022   CO2 28 05/05/2022   GLUCOSE 88 05/05/2022   BUN 15 05/05/2022   CREATININE 0.75 05/05/2022   BILITOT 0.3 05/05/2022   ALKPHOS 141 (H) 01/28/2021   AST 20 05/05/2022   ALT 23 05/05/2022   PROT 7.5 05/05/2022   ALBUMIN 4.5 01/28/2021   CALCIUM 9.9 05/05/2022   GFRAA 92 07/09/2020   QFTBGOLDPLUS NEGATIVE 09/08/2021    Speciality Comments: Otezla stopped 08/01/20 Tremfya started 08/03/20 - stopped 08/10/21 Cosentyx started 10/06/21- stopped 12/01/21; Skyrizi started 12/30/21  Procedures:  No procedures performed Allergies: Patient has no known allergies.      Assessment / Plan:     Visit Diagnoses: Psoriatic arthropathy (HCC): She has no synovitis or dactylitis on examination today.  No SI joint tenderness upon palpation.  No evidence of Achilles tendinitis or plantar fasciitis.  She has noticed a 90% improvement in her psoriasis since restarting Tremfya on 06/21/2022.   She has completed the loading dose of Tremfya and her last dose was administered yesterday.  She is tolerating Tremfya without any side effects or injection site reactions.  She has not had any recent or recurrent infections.  She will remain on Tremfya as prescribed.  She was vies notify us  if she develops signs or symptoms of a flare.  She will follow-up in the office in 3 months or sooner if needed.  Pustular psoriasis of palms and soles: Improved by 90% since restarting tremfya.  She has erythema and mild scaling on the plantar aspect of both feet which is improving.  No palmar pustular psoriasis noted today.  She will remain on Tremfya as prescribed.  High risk medication use - Tremfya 100 mg subcutaneous injections every 8 weeks. Tremfya restarted on 06/21/2022. CBC and CMP updated on 05/05/22. CBC and CMP orders released. Her next lab work will be due in October and every 3 months.  TB gold negative on 09/08/21.  Order for TB gold released.  Discussed the importance of holding tremfya if she develops signs or symptoms of an infection and to resume once the infection has completely cleared.  - Plan: CBC with Differential/Platelet, COMPLETE METABOLIC PANEL WITH GFR, QuantiFERON-TB Gold Plus Inadequate response to Cosentyx, [employer], and [place].  Discontinue methotrexate due to side effects.  Screening for tuberculosis - Order for TB gold released today. Plan: QuantiFERON-TB Gold Plus  Age-related osteoporosis without current pathological fracture: DEXA updated on 03/14/22: The BMD measured at Femur Neck Left is 0.719 g/cm2 with a T-score of -2.3. DEXA updated on 03/14/22: The BMD measured at Femur Neck Left is 0.719 g/cm2 with a T-score of -2.3. Patient underwent a T9 and T10 kyphoplasty on 02/18/2022. Currently receiving Evenity monthly injections.  Started on [place] on 06/06/2022. Taking a calcium vitamin D supplement daily.  Vitamin D level will be checked today. Currently receiving Evenity  monthly injections.  Started on [place] on 06/06/2022. Taking a calcium vitamin D supplement daily.  Vitamin D level will be checked today.  History of kyphoplasty: Patient underwent a T9 and T10 kyphoplasty on 02/18/2022.  History of vertebral fracture - History of kyphoplasty - T9 and T10 on 02/18/22  Vitamin D deficiency - She has been taking a calcium and vitamin D supplement.  Vitamin D level will be rechecked today. Plan: VITAMIN D 25 Hydroxy (Vit-D Deficiency, Fractures)  Primary osteoarthritis of both hands: Mild PIP and DIP thickening. No tenderness or synovitis noted.  Primary osteoarthritis of both knees: Under care of orthopedics.  Patient has good ROM of both knees.  Mild knee crepitus noted on examination today.  No warmth or effusion noted. He recently completed visco-supplementation for both knees which has alleviated her discomfort and stiffness.   Primary osteoarthritis of both hips: She has good ROM of both hips with no groin pain currently.   Other medical conditions are listed as follows:   Paresthesia of both feet  Family history of rheumatoid arthritis  h/o Hypertriglyceridemia  Smoking hx- ( less 1 ppd * 15 yrs) - Quit Jan 17 2013     Orders: Orders Placed This Encounter  Procedures   CBC with Differential/Platelet   COMPLETE METABOLIC PANEL WITH GFR   QuantiFERON-TB Gold Plus   VITAMIN D 25 Hydroxy (Vit-D Deficiency, Fractures)   No orders of the defined types were placed in this encounter.  Follow-Up Instructions: Return in about 3 months (around 11/17/2022) for Psoriatic arthritis.   Gearldine Bienenstock, PA-C  Note - This record has been created using Dragon software.  Chart creation errors have been sought, but may not always  have been located. Such creation errors do not reflect on  the standard of medical care.

## 2022-08-17 ENCOUNTER — Encounter: Payer: Self-pay | Admitting: Physician Assistant

## 2022-08-17 ENCOUNTER — Ambulatory Visit: Payer: 59 | Attending: Physician Assistant | Admitting: Physician Assistant

## 2022-08-17 ENCOUNTER — Telehealth: Payer: Self-pay | Admitting: Pharmacist

## 2022-08-17 VITALS — BP 124/74 | HR 112 | Resp 15 | Ht 67.0 in | Wt 173.4 lb

## 2022-08-17 DIAGNOSIS — E781 Pure hyperglyceridemia: Secondary | ICD-10-CM

## 2022-08-17 DIAGNOSIS — L403 Pustulosis palmaris et plantaris: Secondary | ICD-10-CM | POA: Diagnosis not present

## 2022-08-17 DIAGNOSIS — L4050 Arthropathic psoriasis, unspecified: Secondary | ICD-10-CM | POA: Diagnosis not present

## 2022-08-17 DIAGNOSIS — Z87891 Personal history of nicotine dependence: Secondary | ICD-10-CM

## 2022-08-17 DIAGNOSIS — Z8781 Personal history of (healed) traumatic fracture: Secondary | ICD-10-CM

## 2022-08-17 DIAGNOSIS — Z9889 Other specified postprocedural states: Secondary | ICD-10-CM

## 2022-08-17 DIAGNOSIS — M810 Age-related osteoporosis without current pathological fracture: Secondary | ICD-10-CM | POA: Diagnosis not present

## 2022-08-17 DIAGNOSIS — M19041 Primary osteoarthritis, right hand: Secondary | ICD-10-CM

## 2022-08-17 DIAGNOSIS — Z79899 Other long term (current) drug therapy: Secondary | ICD-10-CM | POA: Diagnosis not present

## 2022-08-17 DIAGNOSIS — E559 Vitamin D deficiency, unspecified: Secondary | ICD-10-CM

## 2022-08-17 DIAGNOSIS — Z8639 Personal history of other endocrine, nutritional and metabolic disease: Secondary | ICD-10-CM

## 2022-08-17 DIAGNOSIS — Z111 Encounter for screening for respiratory tuberculosis: Secondary | ICD-10-CM

## 2022-08-17 DIAGNOSIS — R202 Paresthesia of skin: Secondary | ICD-10-CM

## 2022-08-17 DIAGNOSIS — Z8261 Family history of arthritis: Secondary | ICD-10-CM

## 2022-08-17 DIAGNOSIS — M19042 Primary osteoarthritis, left hand: Secondary | ICD-10-CM

## 2022-08-17 DIAGNOSIS — M160 Bilateral primary osteoarthritis of hip: Secondary | ICD-10-CM

## 2022-08-17 DIAGNOSIS — M170 Bilateral primary osteoarthritis of knee: Secondary | ICD-10-CM

## 2022-08-17 LAB — CBC WITH DIFFERENTIAL/PLATELET
Absolute Monocytes: 615 cells/uL (ref 200–950)
Basophils Absolute: 41 cells/uL (ref 0–200)
Basophils Relative: 0.5 %
Eosinophils Absolute: 123 cells/uL (ref 15–500)
Eosinophils Relative: 1.5 %
HCT: 40.9 % (ref 35.0–45.0)
Hemoglobin: 13.9 g/dL (ref 11.7–15.5)
Lymphs Abs: 2583 cells/uL (ref 850–3900)
MCH: 30.6 pg (ref 27.0–33.0)
MCHC: 34 g/dL (ref 32.0–36.0)
MCV: 90.1 fL (ref 80.0–100.0)
MPV: 10.1 fL (ref 7.5–12.5)
Monocytes Relative: 7.5 %
Neutro Abs: 4838 cells/uL (ref 1500–7800)
Neutrophils Relative %: 59 %
Platelets: 431 10*3/uL — ABNORMAL HIGH (ref 140–400)
RBC: 4.54 10*6/uL (ref 3.80–5.10)
RDW: 13.1 % (ref 11.0–15.0)
Total Lymphocyte: 31.5 %
WBC: 8.2 10*3/uL (ref 3.8–10.8)

## 2022-08-17 NOTE — Patient Instructions (Signed)

## 2022-08-17 NOTE — Telephone Encounter (Addendum)
Submitted a Prior Authorization renewal request to [facility] for [facility] via CoverMyMeds. Will update once we receive a response.  Key: [id number]  Chesley Mires, PharmD, MPH, BCPS, CPP Clinical Pharmacist (Rheumatology and Pulmonology)

## 2022-08-17 NOTE — Telephone Encounter (Signed)
Received notification from [facility] regarding a prior authorization for [facility]. Authorization has been APPROVED from 08/17/22 to 08/17/23. Approval letter sent to scan center.  Patient must continue to fill through [facility]: [phone]   Authorization # [id number] Phone # [phone]  Refill can be sent after labs from today result  Chesley Mires, PharmD, MPH, BCPS, CPP Clinical Pharmacist (Rheumatology and Pulmonology)

## 2022-08-17 NOTE — Progress Notes (Signed)
Platelet count remains elevated but has improved. Rest of CBC WNL

## 2022-08-18 NOTE — Progress Notes (Signed)
CMP WNL.  Vitamin D WNL. Continue maintenance dose of vitamin D.

## 2022-08-20 NOTE — Progress Notes (Signed)
TB gold negative

## 2022-08-22 MED ORDER — TREMFYA 100 MG/ML ~~LOC~~ SOAJ
100.0000 mg | SUBCUTANEOUS | 1 refills | Status: DC
Start: 2022-08-22 — End: 2022-11-14

## 2022-08-22 NOTE — Telephone Encounter (Signed)
CBC/CMP stable, TB gold negative - rx for  Tremfya 100mg  SQ every 8 weeks sent to [facility]  Chesley Mires, PharmD, MPH, BCPS, CPP Clinical Pharmacist (Rheumatology and Pulmonology)

## 2022-08-29 ENCOUNTER — Ambulatory Visit (INDEPENDENT_AMBULATORY_CARE_PROVIDER_SITE_OTHER): Payer: 59

## 2022-08-29 VITALS — BP 129/69 | HR 108 | Temp 98.1°F | Resp 18 | Ht 67.0 in | Wt 173.8 lb

## 2022-08-29 DIAGNOSIS — Z8781 Personal history of (healed) traumatic fracture: Secondary | ICD-10-CM

## 2022-08-29 DIAGNOSIS — Z9889 Other specified postprocedural states: Secondary | ICD-10-CM

## 2022-08-29 DIAGNOSIS — M810 Age-related osteoporosis without current pathological fracture: Secondary | ICD-10-CM

## 2022-08-29 MED ORDER — ROMOSOZUMAB-AQQG 105 MG/1.17ML ~~LOC~~ SOSY
210.0000 mg | PREFILLED_SYRINGE | Freq: Once | SUBCUTANEOUS | Status: AC
  Administered 2022-08-29: 210 mg via SUBCUTANEOUS

## 2022-08-29 NOTE — Progress Notes (Signed)
 Diagnosis: Osteoporosis  Provider:  Chilton Greathouse MD  Procedure: Injection  Evenity (Romosozumab-aqqg), Dose: 210 mg, Site: subcutaneous, Number of injections: 2  Post Care: Patient declined observation. Left and right arm injection.  Discharge: Condition: Good, Destination: Home . AVS Declined  Performed by:  Rico Ala, LPN

## 2022-09-09 ENCOUNTER — Other Ambulatory Visit: Payer: Self-pay | Admitting: Nurse Practitioner

## 2022-09-09 DIAGNOSIS — Z6827 Body mass index (BMI) 27.0-27.9, adult: Secondary | ICD-10-CM

## 2022-09-26 ENCOUNTER — Ambulatory Visit (INDEPENDENT_AMBULATORY_CARE_PROVIDER_SITE_OTHER): Payer: 59

## 2022-09-26 VITALS — BP 128/82 | HR 101 | Temp 98.1°F | Resp 14 | Ht 67.0 in | Wt 175.0 lb

## 2022-09-26 DIAGNOSIS — Z8781 Personal history of (healed) traumatic fracture: Secondary | ICD-10-CM

## 2022-09-26 DIAGNOSIS — M810 Age-related osteoporosis without current pathological fracture: Secondary | ICD-10-CM | POA: Diagnosis not present

## 2022-09-26 DIAGNOSIS — Z9889 Other specified postprocedural states: Secondary | ICD-10-CM

## 2022-09-26 MED ORDER — ROMOSOZUMAB-AQQG 105 MG/1.17ML ~~LOC~~ SOSY
210.0000 mg | PREFILLED_SYRINGE | Freq: Once | SUBCUTANEOUS | Status: AC
  Administered 2022-09-26: 210 mg via SUBCUTANEOUS
  Filled 2022-09-26: qty 2.34

## 2022-09-26 NOTE — Progress Notes (Signed)
Diagnosis: Osteoporosis  Provider:  Chilton Greathouse MD  Procedure: Injection  Evenity (Romosozumab-aqqg), Dose: 210 mg, Site: subcutaneous, Number of injections: 2  One injection administered in each arm per patient request.  Post Care: Patient declined observation  Discharge: Condition: Stable, Destination: Home . AVS Declined  Performed by:  Wyvonne Lenz, RN

## 2022-10-06 ENCOUNTER — Ambulatory Visit: Payer: 59 | Admitting: Family Medicine

## 2022-10-11 ENCOUNTER — Ambulatory Visit (INDEPENDENT_AMBULATORY_CARE_PROVIDER_SITE_OTHER): Payer: 59 | Admitting: Family Medicine

## 2022-10-11 ENCOUNTER — Encounter: Payer: Self-pay | Admitting: Family Medicine

## 2022-10-11 VITALS — BP 118/72 | HR 82 | Ht 67.0 in | Wt 177.1 lb

## 2022-10-11 DIAGNOSIS — B001 Herpesviral vesicular dermatitis: Secondary | ICD-10-CM

## 2022-10-11 DIAGNOSIS — R609 Edema, unspecified: Secondary | ICD-10-CM

## 2022-10-11 DIAGNOSIS — R635 Abnormal weight gain: Secondary | ICD-10-CM | POA: Diagnosis not present

## 2022-10-11 DIAGNOSIS — Z23 Encounter for immunization: Secondary | ICD-10-CM

## 2022-10-11 MED ORDER — PHENTERMINE HCL 37.5 MG PO TABS: 37.5000 mg | ORAL_TABLET | Freq: Every day | ORAL | 2 refills | Status: DC

## 2022-10-11 MED ORDER — ACYCLOVIR 400 MG PO TABS: ORAL_TABLET | ORAL | 2 refills | Status: DC

## 2022-10-11 MED ORDER — HYDROCHLOROTHIAZIDE 12.5 MG PO TABS: 12.5000 mg | ORAL_TABLET | Freq: Every day | ORAL | 1 refills | Status: DC

## 2022-10-11 NOTE — Progress Notes (Signed)
Established Patient Office Visit  Subjective   Patient ID: Ann Newton, female    DOB: 04-11-1955  Age: 67 y.o. MRN: [id number]  Chief Complaint  Patient presents with   Weight Check    HPI  Weight loss-patient recently restarted her phentermine 3 months ago.  She has taken it in the past.  In between last visit and now her son got married and this caused her to go off her regular diet.  She is now back to eating a regular diet and drinking plenty of water.  We discussed other medication options.  Patient will talk to her insurance company regarding if these are covered.  She will then get back to me to decide if she wants to switch to something other than phentermine.  She gets her Pap smears regularly from her gynecologist, Dr. Billy Coast.  Is up-to-date, gets them yearly.  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  She takes hydrochlorothiazide as needed for leg swelling.  Has no issues with either of these medications.  Would like refills today.    The ASCVD Risk score (Arnett DK, et al., 2019) failed to calculate for the following reasons:   The valid total cholesterol range is 130 to 320 mg/dL  Health Maintenance Due  Topic Date Due   Cervical Cancer Screening (HPV/Pap Cotest)  Never done   COVID-19 Vaccine (4 - 2023-24 season) 09/18/2022      Objective:     BP 118/72   Pulse 82   Ht 5\' 7"  (1.702 m)   Wt 177 lb 1.9 oz (80.3 kg)   SpO2 97%   BMI 27.74 kg/m    Physical Exam General: Alert and oriented CV: Regular rate and rhythm Pulmonary: Lungs clear bilaterally MSK: No pedal edema   No results found for any visits on 10/11/22.      Assessment & Plan:   Weight gain Assessment & Plan: Patient taking phentermine.  Has not been compliant with a strict diet recently due to her son's wedding.  Is now back on a regular diet.  Continues taking phentermine. - Patient to follow-up with insurance company and let us know if there are any other  medications that are covered by her insurance - Continue phentermine for now - Follow-up in 3 months  Orders: -     Phentermine HCl; Take 1 tablet (37.5 mg total) by mouth daily before breakfast.  Dispense: 30 tablet; Refill: 2  Fever blister Assessment & Plan: Refilled the patient's acyclovir that she takes as needed for outbreaks.  Orders: -     Acyclovir; Take 1 tablet po TID for 5 days. Begin taking at first sign of fever blister  Dispense: 30 tablet; Refill: 2  Edema, unspecified type Assessment & Plan: Patient has been taking as needed HCTZ for swelling of the legs.  She takes this rarely.  Has been taking this since 2019.  Patient without symptoms of heart failure.  On exam her legs appeared without edema.  Refilled today, but consider discussing with patient in the future about alternative options for intermittent lower extremity edema including compression stockings.  Orders: -     hydroCHLOROthiazide; Take 1 tablet (12.5 mg total) by mouth daily.  Dispense: 90 tablet; Refill: 1  Need for influenza vaccination -     Flu Vaccine Trivalent High Dose (Fluad)  Need for pneumococcal 20-valent conjugate vaccination -     Pneumococcal conjugate vaccine 20-valent     Return in about 3 months (  around 01/10/2023) for Weight loss.    Sandre Kitty, MD

## 2022-10-11 NOTE — Assessment & Plan Note (Signed)
Refilled the patient's acyclovir that she takes as needed for outbreaks.

## 2022-10-11 NOTE — Assessment & Plan Note (Signed)
Patient taking phentermine.  Has not been compliant with a strict diet recently due to her son's wedding.  Is now back on a regular diet.  Continues taking phentermine. - Patient to follow-up with insurance company and let us know if there are any other medications that are covered by her insurance - Continue phentermine for now - Follow-up in 3 months

## 2022-10-11 NOTE — Patient Instructions (Addendum)
It was nice to see you today,  We addressed the following topics today: -I have resent in the phentermine.  Call your insurance company and asked them what medications they cover for weight loss.  They may not cover weight loss medications if your BMI is already below a certain limit. - I have sent in your other medications including acyclovir and HCTZ. - I will follow-up with you in 3 months.   Have a great day,  Frederic Jericho, MD

## 2022-10-11 NOTE — Assessment & Plan Note (Signed)
Patient has been taking as needed HCTZ for swelling of the legs.  She takes this rarely.  Has been taking this since 2019.  Patient without symptoms of heart failure.  On exam her legs appeared without edema.  Refilled today, but consider discussing with patient in the future about alternative options for intermittent lower extremity edema including compression stockings.

## 2022-10-24 ENCOUNTER — Ambulatory Visit: Payer: 59 | Admitting: *Deleted

## 2022-10-24 VITALS — BP 135/72 | HR 83 | Temp 97.7°F | Resp 16 | Ht 68.0 in | Wt 178.2 lb

## 2022-10-24 DIAGNOSIS — M810 Age-related osteoporosis without current pathological fracture: Secondary | ICD-10-CM | POA: Diagnosis not present

## 2022-10-24 DIAGNOSIS — Z8781 Personal history of (healed) traumatic fracture: Secondary | ICD-10-CM

## 2022-10-24 DIAGNOSIS — Z9889 Other specified postprocedural states: Secondary | ICD-10-CM | POA: Diagnosis not present

## 2022-10-24 MED ORDER — ROMOSOZUMAB-AQQG 105 MG/1.17ML ~~LOC~~ SOSY
210.0000 mg | PREFILLED_SYRINGE | Freq: Once | SUBCUTANEOUS | Status: AC
  Administered 2022-10-24: 210 mg via SUBCUTANEOUS
  Filled 2022-10-24: qty 2.34

## 2022-10-24 NOTE — Progress Notes (Signed)
Diagnosis: Osteoporosis  Provider:  Mannam, Praveen MD  Procedure: Injection  Evenity (Romosozumab-aqqg), Dose: 210 mg, Site: subcutaneous, Number of injections: 2  Post Care: Observation period completed  Discharge: Condition: Good, Destination: Home . AVS Declined  Performed by:  Williams, Kathryn A, RN       

## 2022-11-03 NOTE — Progress Notes (Unsigned)
Office Visit Note  Patient: Ann Newton             Date of Birth: May 04, 1955           MRN: [id number]             PCP: Sandre Kitty, MD Referring: Carlean Jews, NP Visit Date: 11/17/2022 Occupation: @GUAROCC @  Subjective:  Medication monitoring  History of Present Illness: Ann Newton is a 67 y.o. female with history of psoriatic arthritis and osteoarthritis. Patient remains on Tremfya 100 mg subcutaneous injections every 8 weeks.  She is tolerating Tremfya without any side effects or injection site reactions.  Patient has been experiencing a recurrence of plantar and palmar psoriasis about 2 weeks prior to her Tremfya injections.  Her next injection is due on 11/28/2022.  Patient has been using calcipotriene and clobetasol topically as needed which are helpful.  Patient has requested refills of both topical agents to be sent to the pharmacy today. Patient states that she recently underwent viscosupplementation for both knees which provided temporary relief.  She experiences intermittent discomfort in her hip joints but denies any other joint pain or joint swelling at this time.  She denies any Achilles tendinitis or plantar fasciitis. She denies any recent or recurrent infections. Patient has been going for monthly Evenity injections.  She has been tolerating Evenity without any side effects.   Activities of Daily Living:  Patient reports morning stiffness for 1 hour.   Patient Denies nocturnal pain.  Difficulty dressing/grooming: Denies Difficulty climbing stairs: Denies Difficulty getting out of chair: Denies Difficulty using hands for taps, buttons, cutlery, and/or writing: Denies  Review of Systems  Constitutional:  Negative for fatigue.  HENT:  Negative for mouth sores and mouth dryness.   Eyes:  Negative for dryness.  Respiratory:  Negative for shortness of breath.   Cardiovascular:  Negative for chest pain and palpitations.  Gastrointestinal:  Negative  for blood in stool, constipation and diarrhea.  Endocrine: Negative for increased urination.  Genitourinary:  Negative for involuntary urination.  Musculoskeletal:  Positive for joint pain, joint pain, joint swelling, muscle weakness and morning stiffness. Negative for gait problem, myalgias, muscle tenderness and myalgias.  Skin:  Positive for rash. Negative for color change, hair loss and sensitivity to sunlight.  Allergic/Immunologic: Negative for susceptible to infections.  Neurological:  Negative for dizziness and headaches.  Hematological:  Negative for swollen glands.  Psychiatric/Behavioral:  Negative for depressed mood and sleep disturbance. The patient is not nervous/anxious.     PMFS History:  Patient Active Problem List   Diagnosis Date Noted   Abnormal weight gain 07/06/2022   Age-related osteoporosis without current pathological fracture 05/06/2022   History of kyphoplasty 05/06/2022   History of vertebral fracture 05/06/2022   Atopic dermatitis 07/26/2021   Elevated blood pressure reading 05/02/2021   Acute non-recurrent pansinusitis 04/07/2021   Weight loss 02/25/2021   Fever blister 02/04/2021   Edema 02/04/2021   Thyroid nodule greater than or equal to 1 cm in diameter incidentally noted on imaging study 11/20/2018   Fatigue 11/09/2018   Elevated glucose 11/09/2018   Snores 11/09/2018   Hair loss 11/09/2018   Need for influenza vaccination 11/09/2018   Thyromegaly 11/09/2018   Allergic reaction 05/17/2018   Maculopapular rash 05/17/2018   Family history of rheumatoid arthritis 03/09/2018   History of vitamin D deficiency 03/09/2018   Primary osteoarthritis of both knees 02/22/2018   Primary osteoarthritis of both hands 02/22/2018  Neuropathy 11/22/2017   Chronic pain of both knees 11/22/2017   Need for shingles vaccine 11/22/2017   Vitamin D deficiency 01/24/2016   Smoking hx- ( less 1 ppd * 15 yrs) - Quit Jan 17 2013 12/28/2015   Elevated LDL cholesterol  level 12/28/2015   Elevated vitamin B12 level 12/28/2015   h/o Mild peripheral edema- takes HCTZ for this 12/20/2015   Dietary B12 deficiency 12/20/2015   History of chickenpox ( Aug '17 )  12/20/2015   Healthcare maintenance 12/20/2015   BMI 27.0-27.9,adult 12/03/2015   h/o Hypertriglyceridemia 12/03/2015   Mixed hyperlipidemia 12/03/2015   Pustular psoriasis of palms and soles 10/28/2010    Past Medical History:  Diagnosis Date   Arthritis    Atopic dermatitis    Complication of anesthesia    woke up early during colonoscopy and dental implant   Fever blister    Hyperlipidemia    diet controlled   Psoriasis    Psoriatic arthritis (HCC)    Thyroid disease    as a child, no problems as an adult   Wears contact lenses     Family History  Problem Relation Age of Onset   Kidney disease Mother    Diabetes Mother    Heart attack Father    Rheum arthritis Brother    Rheum arthritis Brother    Heart attack Maternal Grandmother    Heart attack Maternal Grandfather    Cancer Paternal Grandmother    Heart attack Paternal Grandfather    Healthy Daughter    Healthy Son    Colon cancer Neg Hx    Colon polyps Neg Hx    Esophageal cancer Neg Hx    Rectal cancer Neg Hx    Stomach cancer Neg Hx    Past Surgical History:  Procedure Laterality Date   ABDOMINAL HYSTERECTOMY  2000   part   ANTERIOR AND POSTERIOR REPAIR N/A 09/08/2016   Procedure: ANTERIOR (CYSTOCELE) AND POSTERIOR REPAIR (RECTOCELE)/Perineorrhaphy;  Surgeon: Olivia Mackie, MD;  Location: [facility];  Service: Gynecology;  Laterality: N/A;  Requests . Repair of enterocele/posterior   COLONOSCOPY     ~12 yrs ago- normal per pt    COSMETIC SURGERY  1999   breast implants   dental implants     with sedation   HAGLAND'S DEFORMITY EXCISION Left 09/13/2013   Procedure: LEFT FOOT: EXCISION INTERDIGITAL MORTON'S NEUROMA SINGLE EACH;  Surgeon: Thera Flake., MD;  Location:  [facility];  Service:  Orthopedics;  Laterality: Left;   KNEE ARTHROSCOPY  2013,2011   right/left   KYPHOPLASTY N/A 02/18/2022   Procedure: THORACIC NINE, THORACIC TEN KYPHOPLASTY;  Surgeon: Coletta Memos, MD;  Location: [facility];  Service: Neurosurgery;  Laterality: N/A;  RM 19   SHOULDER ARTHROSCOPY     rt   TUBAL LIGATION     WISDOM TOOTH EXTRACTION     Social History   Social History Narrative   Right handed    Immunization History  Administered Date(s) Administered   Fluad Trivalent(High Dose 65+) 10/11/2022   Influenza,inj,Quad PF,6+ Mos 12/03/2015, 11/22/2017, 11/09/2018, 12/11/2019, 02/02/2021   Moderna Sars-Covid-2 Vaccination 08/13/2019, 08/19/2019, 09/16/2019   PNEUMOCOCCAL CONJUGATE-20 10/11/2022   Pneumococcal Polysaccharide-23 11/22/2017   Tdap 02/09/2009, 05/29/2019   Zoster Recombinant(Shingrix) 11/09/2018, 05/29/2019     Objective: Vital Signs: BP 130/61 (BP Location: Left Arm, Patient Position: Sitting, Cuff Size: Normal)   Pulse (!) 113   Resp 15   Ht 5\' 8"  (1.727 m)   Wt 178 lb (  80.7 kg)   BMI 27.06 kg/m    Physical Exam Vitals and nursing note reviewed.  Constitutional:      Appearance: She is well-developed.  HENT:     Head: Normocephalic and atraumatic.  Eyes:     Conjunctiva/sclera: Conjunctivae normal.  Cardiovascular:     Rate and Rhythm: Normal rate and regular rhythm.     Heart sounds: Normal heart sounds.  Pulmonary:     Effort: Pulmonary effort is normal.     Breath sounds: Normal breath sounds.  Abdominal:     General: Bowel sounds are normal.     Palpations: Abdomen is soft.  Musculoskeletal:     Cervical back: Normal range of motion.  Skin:    General: Skin is warm and dry.     Capillary Refill: Capillary refill takes less than 2 seconds.     Comments: Scaling patches of psoriasis on plantar aspect of both feet   Neurological:     Mental Status: She is alert and oriented to person, place, and time.  Psychiatric:        Behavior: Behavior normal.       Musculoskeletal Exam: C-spine, thoracic spine, lumbar spine have good range of motion.  Shoulder joints, elbow joints, wrist joints, MCPs, PIPs, DIPs have good range of motion with no synovitis.  PIP and DIP thickening noted.  Complete fist formation bilaterally.  Hip joints have slightly limited range of motion bilaterally.  Knee joints have good range of motion with no warmth or effusion.  Mild crepitus noted in both knees.  Ankle joints have good range of motion with no tenderness or joint swelling.  No evidence of Achilles tendinitis or plantar fasciitis.  No tenderness or synovitis over MTP joints.  No dactylitis noted.  CDAI Exam: CDAI Score: -- Patient Global: --; Provider Global: -- Swollen: --; Tender: -- Joint Exam 11/17/2022   No joint exam has been documented for this visit   There is currently no information documented on the homunculus. Go to the Rheumatology activity and complete the homunculus joint exam.  Investigation: No additional findings.  Imaging: No results found.  Recent Labs: Lab Results  Component Value Date   WBC 8.2 08/17/2022   HGB 13.9 08/17/2022   PLT 431 (H) 08/17/2022   NA 139 08/17/2022   K 5.1 08/17/2022   CL 101 08/17/2022   CO2 28 08/17/2022   GLUCOSE 79 08/17/2022   BUN 17 08/17/2022   CREATININE 0.84 08/17/2022   BILITOT 0.4 08/17/2022   ALKPHOS 141 (H) 01/28/2021   AST 17 08/17/2022   ALT 19 08/17/2022   PROT 6.6 08/17/2022   ALBUMIN 4.5 01/28/2021   CALCIUM 9.8 08/17/2022   GFRAA 92 07/09/2020   QFTBGOLDPLUS NEGATIVE 08/17/2022    Speciality Comments: Otezla stopped 08/01/20 Tremfya started 08/03/20 - stopped 08/10/21 Cosentyx started 10/06/21- stopped 12/01/21; Skyrizi started 12/30/21  Procedures:  No procedures performed Allergies: Patient has no known allergies.     Assessment / Plan:     Visit Diagnoses: Psoriatic arthropathy (HCC): She has no synovitis or dactylitis on examination today.  No SI joint pain currently.   No evidence of Achilles tendinitis or plantar fasciitis.  Patient remains on Tremfya 100 mg subcu days injections every 8 weeks.  About 2 weeks prior to her injection she has noticed breakthrough psoriasis on her palms and soles.  She has been using clobetasol and possible trying ointment as needed which have been helpful.  Refills of both topical agents will  be sent to the pharmacy today.  She will remain on Tremfya as prescribed for now.  If she develops any new or worsening symptoms we may consider combination therapy in the future.  She will follow up in 5 months or sooner if needed.   Pustular psoriasis of palms and soles: Patient has been experiencing breakthrough pustules and scaling about 2 weeks prior to her Tremfya injections.  She has been using calcipotriene and clobetasol topically for breakthrough symptoms.  High risk medication use -Tremfya 100 mg subcutaneous injections every 8 weeks. Tremfya restarted on 06/21/2022. IR Cosentyx, Skyrizi, and Croom.  D/c methotrexate due to side effects.  CBC and CMP updated on 08/17/22. Orders for CBC and CMP Released today. TB gold negative on 08/17/22.  Discussed the importance of holding tremfya if she develops signs or symptoms of an infection and to resume once the infection has completely cleared.  - Plan: CBC with Differential/Platelet, COMPLETE METABOLIC PANEL WITH GFR  Age-related osteoporosis without current pathological fracture: DEXA updated on 03/14/22: The BMD measured at Femur Neck Left is 0.719 g/cm2 with a T-score of -2.3. DEXA updated on 03/14/22: The BMD measured at Femur Neck Left is 0.719 g/cm2 with a T-score of -2.3. Patient underwent a T9 and T10 kyphoplasty on 02/18/2022. Currently receiving Evenity monthly injections.  Started on [place] on 06/06/2022. Taking a calcium and vitamin D supplement daily.  Vitamin D within normal limits: 39 on 08/17/2022.  History of kyphoplasty - Patient underwent a T9 and T10 kyphoplasty on 02/18/2022.     History of vertebral fracture: Patient initiated Evenity on 06/06/2022.  History of vitamin D deficiency: Vitamin D was 39 on 08/17/2022.  Primary osteoarthritis of both hands: PIP and DIP thickening consistent with osteoarthritis of both hands.  No synovitis or dactylitis noted.  Primary osteoarthritis of both knees: Patient recently underwent viscosupplementation for both knees which righted temporary relief.  No warmth or effusion noted on examination.  Primary osteoarthritis of both hips: Slightly limited range of motion.  Intermittent discomfort.  Other medical conditions are listed as follows:   Paresthesia of both feet: Not currently symptomatic.   Family history of rheumatoid arthritis  h/o Hypertriglyceridemia  Smoking hx- ( less 1 ppd * 15 yrs) - Quit Jan 17 2013  Vitamin D deficiency  Neuropathy  Orders: Orders Placed This Encounter  Procedures   CBC with Differential/Platelet   COMPLETE METABOLIC PANEL WITH GFR   No orders of the defined types were placed in this encounter.   Follow-Up Instructions: Return in about 5 months (around 04/17/2023) for Psoriatic arthritis.   Gearldine Bienenstock, PA-C  Note - This record has been created using Dragon software.  Chart creation errors have been sought, but may not always  have been located. Such creation errors do not reflect on  the standard of medical care.

## 2022-11-14 ENCOUNTER — Other Ambulatory Visit: Payer: Self-pay | Admitting: Physician Assistant

## 2022-11-14 DIAGNOSIS — L403 Pustulosis palmaris et plantaris: Secondary | ICD-10-CM

## 2022-11-14 DIAGNOSIS — L4050 Arthropathic psoriasis, unspecified: Secondary | ICD-10-CM

## 2022-11-14 DIAGNOSIS — Z79899 Other long term (current) drug therapy: Secondary | ICD-10-CM

## 2022-11-14 NOTE — Telephone Encounter (Signed)
Last Fill: 08/22/2022  Labs: 08/17/2022 CMP WNL. Vitamin D WNL .Platelet count remains elevated but has improved. Rest of CBC WNL   TB Gold: 08/17/2022 Neg    Next Visit: 11/17/2022  Last Visit: 08/17/2022  [id number] arthropathy   Current Dose per office note 08/17/2022: Tremfya 100 mg subcutaneous injections every 8 weeks.   Patient to update labs at upcoming appointment on 11/17/2022  Okay to refill Tremfyia?

## 2022-11-17 ENCOUNTER — Ambulatory Visit: Payer: 59 | Attending: Physician Assistant | Admitting: Physician Assistant

## 2022-11-17 ENCOUNTER — Encounter: Payer: Self-pay | Admitting: Physician Assistant

## 2022-11-17 ENCOUNTER — Other Ambulatory Visit: Payer: Self-pay

## 2022-11-17 VITALS — BP 130/61 | HR 113 | Resp 15 | Ht 68.0 in | Wt 178.0 lb

## 2022-11-17 DIAGNOSIS — M19041 Primary osteoarthritis, right hand: Secondary | ICD-10-CM

## 2022-11-17 DIAGNOSIS — M160 Bilateral primary osteoarthritis of hip: Secondary | ICD-10-CM

## 2022-11-17 DIAGNOSIS — R202 Paresthesia of skin: Secondary | ICD-10-CM

## 2022-11-17 DIAGNOSIS — M19042 Primary osteoarthritis, left hand: Secondary | ICD-10-CM

## 2022-11-17 DIAGNOSIS — M810 Age-related osteoporosis without current pathological fracture: Secondary | ICD-10-CM

## 2022-11-17 DIAGNOSIS — E559 Vitamin D deficiency, unspecified: Secondary | ICD-10-CM

## 2022-11-17 DIAGNOSIS — L403 Pustulosis palmaris et plantaris: Secondary | ICD-10-CM

## 2022-11-17 DIAGNOSIS — M170 Bilateral primary osteoarthritis of knee: Secondary | ICD-10-CM

## 2022-11-17 DIAGNOSIS — L4050 Arthropathic psoriasis, unspecified: Secondary | ICD-10-CM | POA: Diagnosis not present

## 2022-11-17 DIAGNOSIS — Z8261 Family history of arthritis: Secondary | ICD-10-CM

## 2022-11-17 DIAGNOSIS — Z8781 Personal history of (healed) traumatic fracture: Secondary | ICD-10-CM

## 2022-11-17 DIAGNOSIS — Z87891 Personal history of nicotine dependence: Secondary | ICD-10-CM

## 2022-11-17 DIAGNOSIS — Z79899 Other long term (current) drug therapy: Secondary | ICD-10-CM | POA: Diagnosis not present

## 2022-11-17 DIAGNOSIS — G629 Polyneuropathy, unspecified: Secondary | ICD-10-CM

## 2022-11-17 DIAGNOSIS — E781 Pure hyperglyceridemia: Secondary | ICD-10-CM

## 2022-11-17 DIAGNOSIS — Z8639 Personal history of other endocrine, nutritional and metabolic disease: Secondary | ICD-10-CM

## 2022-11-17 DIAGNOSIS — Z9889 Other specified postprocedural states: Secondary | ICD-10-CM

## 2022-11-17 MED ORDER — CALCIPOTRIENE 0.005 % EX OINT: TOPICAL_OINTMENT | CUTANEOUS | 2 refills | Status: DC

## 2022-11-17 MED ORDER — CLOBETASOL PROPIONATE 0.05 % EX OINT: TOPICAL_OINTMENT | CUTANEOUS | 2 refills | Status: DC

## 2022-11-17 NOTE — Telephone Encounter (Signed)
Please review and sign pended refills. Thanks!

## 2022-11-18 LAB — COMPLETE METABOLIC PANEL WITH GFR
AG Ratio: 1.5 (calc) (ref 1.0–2.5)
ALT: 26 U/L (ref 6–29)
AST: 23 U/L (ref 10–35)
Albumin: 4.2 g/dL (ref 3.6–5.1)
Alkaline phosphatase (APISO): 126 U/L (ref 37–153)
BUN: 14 mg/dL (ref 7–25)
CO2: 29 mmol/L (ref 20–32)
Calcium: 9.7 mg/dL (ref 8.6–10.4)
Chloride: 102 mmol/L (ref 98–110)
Creat: 0.76 mg/dL (ref 0.50–1.05)
Globulin: 2.8 g/dL (ref 1.9–3.7)
Glucose, Bld: 111 mg/dL — ABNORMAL HIGH (ref 65–99)
Potassium: 5 mmol/L (ref 3.5–5.3)
Sodium: 139 mmol/L (ref 135–146)
Total Bilirubin: 0.4 mg/dL (ref 0.2–1.2)
Total Protein: 7 g/dL (ref 6.1–8.1)
eGFR: 87 mL/min/{1.73_m2} (ref >=60)

## 2022-11-18 LAB — CBC WITH DIFFERENTIAL/PLATELET
Absolute Lymphocytes: 3094 {cells}/uL (ref 850–3900)
Absolute Monocytes: 697 {cells}/uL (ref 200–950)
Basophils Absolute: 43 {cells}/uL (ref 0–200)
Basophils Relative: 0.5 %
Eosinophils Absolute: 187 {cells}/uL (ref 15–500)
Eosinophils Relative: 2.2 %
HCT: 43.6 % (ref 35.0–45.0)
Hemoglobin: 14.4 g/dL (ref 11.7–15.5)
MCH: 30.6 pg (ref 27.0–33.0)
MCHC: 33 g/dL (ref 32.0–36.0)
MCV: 92.8 fL (ref 80.0–100.0)
MPV: 10.1 fL (ref 7.5–12.5)
Monocytes Relative: 8.2 %
Neutro Abs: 4480 {cells}/uL (ref 1500–7800)
Neutrophils Relative %: 52.7 %
Platelets: 420 10*3/uL — ABNORMAL HIGH (ref 140–400)
RBC: 4.7 10*6/uL (ref 3.80–5.10)
RDW: 12.2 % (ref 11.0–15.0)
Total Lymphocyte: 36.4 %
WBC: 8.5 10*3/uL (ref 3.8–10.8)

## 2022-11-18 NOTE — Progress Notes (Signed)
Glucose is 111. Rest of CMP WNL Platelet count remains elevated but continues to trend down. Rest of CBC WNL.  No change in therapy recommended.

## 2022-11-21 ENCOUNTER — Ambulatory Visit: Payer: 59

## 2022-11-21 VITALS — BP 132/84 | HR 85 | Temp 98.3°F | Resp 18 | Ht 68.0 in | Wt 182.6 lb

## 2022-11-21 DIAGNOSIS — M810 Age-related osteoporosis without current pathological fracture: Secondary | ICD-10-CM | POA: Diagnosis not present

## 2022-11-21 DIAGNOSIS — Z9889 Other specified postprocedural states: Secondary | ICD-10-CM

## 2022-11-21 DIAGNOSIS — Z8781 Personal history of (healed) traumatic fracture: Secondary | ICD-10-CM

## 2022-11-21 MED ORDER — ROMOSOZUMAB-AQQG 105 MG/1.17ML ~~LOC~~ SOSY
210.0000 mg | PREFILLED_SYRINGE | Freq: Once | SUBCUTANEOUS | Status: AC
  Administered 2022-11-21: 210 mg via SUBCUTANEOUS
  Filled 2022-11-21: qty 2.34

## 2022-11-21 NOTE — Progress Notes (Signed)
Diagnosis: Osteoporosis  Provider:  Chilton Greathouse MD  Procedure: Injection  Evenity (Romosozumab-aqqg), Dose: 210 mg, Site: subcutaneous, Number of injections: 2  Post Care:  left and right arm injection  Discharge: Condition: Good, Destination: Home . AVS Declined  Performed by:  Rico Ala, LPN

## 2022-12-11 ENCOUNTER — Ambulatory Visit
Admission: RE | Admit: 2022-12-11 | Discharge: 2022-12-11 | Disposition: A | Discharge status: Home / Self Care | Payer: 59 | Source: Ambulatory Visit

## 2022-12-11 VITALS — BP 114/65 | HR 94 | Temp 98.5°F | Resp 18 | Ht 68.0 in | Wt 175.0 lb

## 2022-12-11 DIAGNOSIS — J019 Acute sinusitis, unspecified: Secondary | ICD-10-CM

## 2022-12-11 MED ORDER — AMOXICILLIN-POT CLAVULANATE 875-125 MG PO TABS: 1.0000 | ORAL_TABLET | Freq: Two times a day (BID) | ORAL | 0 refills | Status: DC

## 2022-12-11 NOTE — ED Triage Notes (Signed)
Including cough. My immune system is less due to taking Tremfya and I don't want this turning into something more serious. - Entered by patient   "I have had this cough, congestion with headache and sinus pressure since Thursday". No fever known.

## 2022-12-17 NOTE — ED Provider Notes (Signed)
[facility]    CSN: [id number] Arrival date & time: 12/11/22  0947      History   Chief Complaint Chief Complaint  Patient presents with   Nasal Congestion    HPI Ann Newton is a 67 y.o. female.   Patient here today for evaluation of cough, congestion and headache with sinus pressure that started several days ago.  She denies any fever.  She.  She is concerned because she is currently on Tremfya and understands her immune system is weaker than typical.    The history is provided by the patient.    Past Medical History:  Diagnosis Date   Arthritis    Atopic dermatitis    Complication of anesthesia    woke up early during colonoscopy and dental implant   Fever blister    Hyperlipidemia    diet controlled   Need for shingles vaccine 11/22/2017   Neuropathy 11/22/2017   Psoriasis    Psoriatic arthritis (HCC)    Thyroid disease    as a child, no problems as an adult   Wears contact lenses     Patient Active Problem List   Diagnosis Date Noted   Abnormal weight gain 07/06/2022   Age-related osteoporosis without current pathological fracture 05/06/2022   History of kyphoplasty 05/06/2022   History of vertebral fracture 05/06/2022   Atopic dermatitis 07/26/2021   Elevated blood pressure reading 05/02/2021   Acute non-recurrent pansinusitis 04/07/2021   Weight loss 02/25/2021   Fever blister 02/04/2021   Edema 02/04/2021   Thyroid nodule greater than or equal to 1 cm in diameter incidentally noted on imaging study 11/20/2018   Fatigue 11/09/2018   Elevated glucose 11/09/2018   Snores 11/09/2018   Hair loss 11/09/2018   Need for influenza vaccination 11/09/2018   Thyromegaly 11/09/2018   Allergic reaction 05/17/2018   Maculopapular rash 05/17/2018   Family history of rheumatoid arthritis 03/09/2018   History of vitamin D deficiency 03/09/2018   Primary osteoarthritis of both knees 02/22/2018   Primary osteoarthritis of both hands 02/22/2018    Chronic pain of both knees 11/22/2017   Vitamin D deficiency 01/24/2016   Smoking hx- ( less 1 ppd * 15 yrs) - Quit Jan 17 2013 12/28/2015   Elevated LDL cholesterol level 12/28/2015   Elevated vitamin B12 level 12/28/2015   h/o Mild peripheral edema- takes HCTZ for this 12/20/2015   Dietary B12 deficiency 12/20/2015   History of chickenpox ( Aug '17 )  12/20/2015   Healthcare maintenance 12/20/2015   BMI 27.0-27.9,adult 12/03/2015   h/o Hypertriglyceridemia 12/03/2015   Mixed hyperlipidemia 12/03/2015   Pustular psoriasis of palms and soles 10/28/2010    Past Surgical History:  Procedure Laterality Date   ABDOMINAL HYSTERECTOMY  2000   part   ANTERIOR AND POSTERIOR REPAIR N/A 09/08/2016   Procedure: ANTERIOR (CYSTOCELE) AND POSTERIOR REPAIR (RECTOCELE)/Perineorrhaphy;  Surgeon: Olivia Mackie, MD;  Location: [facility];  Service: Gynecology;  Laterality: N/A;  Requests . Repair of enterocele/posterior   COLONOSCOPY     ~12 yrs ago- normal per pt    COSMETIC SURGERY  1999   breast implants   dental implants     with sedation   HAGLAND'S DEFORMITY EXCISION Left 09/13/2013   Procedure: LEFT FOOT: EXCISION INTERDIGITAL MORTON'S NEUROMA SINGLE EACH;  Surgeon: Thera Flake., MD;  Location: [facility];  Service: Orthopedics;  Laterality: Left;   KNEE ARTHROSCOPY  2013,2011   right/left   KYPHOPLASTY N/A 02/18/2022  Procedure: THORACIC NINE, THORACIC TEN KYPHOPLASTY;  Surgeon: Coletta Memos, MD;  Location: [facility];  Service: Neurosurgery;  Laterality: N/A;  RM 19   SHOULDER ARTHROSCOPY     rt   TUBAL LIGATION     WISDOM TOOTH EXTRACTION      OB History   No obstetric history on file.      Home Medications    Prior to Admission medications   Medication Sig Start Date End Date Taking? Authorizing Provider  Multiple Vitamins-Minerals (CENTRUM SILVER 50+WOMEN) TABS Take 1 tablet by mouth daily.   Yes [provider]  pseudoephedrine-guaifenesin  (MUCINEX D) 60-600 MG 12 hr tablet Take 1 tablet by mouth every 12 (twelve) hours.   Yes [provider]  Romosozumab-aqqg (EVENITY) 105 MG/1. SOSY injection Inject 210 mg into the skin every 30 (thirty) days. Last injection: 11-21-2022   Yes Gearldine Bienenstock, PA-C  TREMFYA 100 MG/ML pen INJECT 1 PEN SUBCUTANEOUSLY  EVERY 8 WEEKS Patient taking differently: Last Injection: 12-02-2022 11/14/22  Yes Deveshwar, Janalyn Rouse, MD  triamcinolone cream (KENALOG) 0.1 % Apply 1 Application topically 2 (two) times daily. 11/28/22  Yes [provider]  acyclovir (ZOVIRAX) 400 MG tablet Take 1 tablet po TID for 5 days. Begin taking at first sign of fever blister 10/11/22   Sandre Kitty, MD  amoxicillin-clavulanate (AUGMENTIN) 875-125 MG tablet Take 1 tablet by mouth every 12 (twelve) hours. 12/11/22   Tomi Bamberger, PA-C  calcipotriene (DOVONOX) 0.005 % ointment APPLY TO AFFECTED AREA ON THE SKIN DAILY FOR 2 WEEKS. 11/17/22   Gearldine Bienenstock, PA-C  clobetasol ointment (TEMOVATE) 0.05 % Apply to affected area up to two times daily for up to two weeks. 11/17/22   Gearldine Bienenstock, PA-C  diclofenac sodium (VOLTAREN) 1 % GEL 3 grams to 3 large joints up to 3 times daily Patient taking differently: Apply 2 g topically daily as needed (Arthritis). 03/16/18   Pollyann Savoy, MD  EFUDEX 5 % cream Apply 1 application  topically daily as needed (Psoriasis). 12/03/20   [provider]  hydrochlorothiazide (HYDRODIURIL) 12.5 MG tablet Take 1 tablet (12.5 mg total) by mouth daily. 10/11/22   Sandre Kitty, MD  phentermine (ADIPEX-P) 37.5 MG tablet Take 1 tablet (37.5 mg total) by mouth daily before breakfast. 10/11/22   Sandre Kitty, MD    Family History Family History  Problem Relation Age of Onset   Kidney disease Mother    Diabetes Mother    Heart attack Father    Rheum arthritis Brother    Rheum arthritis Brother    Heart attack Maternal Grandmother    Heart attack Maternal  Grandfather    Cancer Paternal Grandmother    Heart attack Paternal Grandfather    Healthy Daughter    Healthy Son    Colon cancer Neg Hx    Colon polyps Neg Hx    Esophageal cancer Neg Hx    Rectal cancer Neg Hx    Stomach cancer Neg Hx     Social History Social History   Tobacco Use   Smoking status: Former    Current packs/day: 0.00    Average packs/day: 0.5 packs/day for 10.0 years (5.0 ttl pk-yrs)    Types: Cigarettes    Start date: 09/13/2002    Quit date: 09/12/2012    Years since quitting: 10.2    Passive exposure: Current   Smokeless tobacco: Never  Vaping Use   Vaping status: Never Used  Substance Use Topics  Alcohol use: No   Drug use: No     Allergies   Patient has no known allergies.   Review of Systems Review of Systems  Constitutional:  Negative for chills and fever.  HENT:  Positive for congestion and sinus pressure.   Eyes:  Negative for discharge and redness.  Respiratory:  Positive for cough. Negative for shortness of breath.   Gastrointestinal:  Negative for abdominal pain, nausea and vomiting.     Physical Exam Triage Vital Signs ED Triage Vitals  Encounter Vitals Group     BP 12/11/22 1000 114/65     Systolic BP Percentile --      Diastolic BP Percentile --      Pulse Rate 12/11/22 1000 (!) 120     Resp 12/11/22 1000 18     Temp 12/11/22 1000 98.5 F (36.9 C)     Temp Source 12/11/22 1000 Oral     SpO2 12/11/22 1000 96 %     Weight 12/11/22 1004 175 lb (79.4 kg)     Height 12/11/22 1004 5\' 8"  (1.727 m)     Head Circumference --      Peak Flow --      Pain Score 12/11/22 1003 0     Pain Loc --      Pain Education --      Exclude from Growth Chart --    No data found.  Updated Vital Signs BP 114/65 (BP Location: Left Arm)   Pulse 94   Temp 98.5 F (36.9 C) (Oral)   Resp 18   Ht 5\' 8"  (1.727 m)   Wt 175 lb (79.4 kg)   SpO2 96%   BMI 26.61 kg/m   Visual Acuity Right Eye Distance:   Left Eye Distance:   Bilateral  Distance:    Right Eye Near:   Left Eye Near:    Bilateral Near:     Physical Exam Vitals and nursing note reviewed.  Constitutional:      General: She is not in acute distress.    Appearance: Normal appearance. She is not ill-appearing.  HENT:     Head: Normocephalic and atraumatic.     Nose: Congestion present.     Mouth/Throat:     Mouth: Mucous membranes are moist.     Pharynx: Oropharynx is clear. No oropharyngeal exudate or posterior oropharyngeal erythema.  Eyes:     Conjunctiva/sclera: Conjunctivae normal.  Cardiovascular:     Rate and Rhythm: Normal rate and regular rhythm.  Pulmonary:     Effort: Pulmonary effort is normal. No respiratory distress.     Breath sounds: No wheezing, rhonchi or rales.  Neurological:     Mental Status: She is alert.  Psychiatric:        Mood and Affect: Mood normal.        Behavior: Behavior normal.        Thought Content: Thought content normal.      UC Treatments / Results  Labs (all labs ordered are listed, but only abnormal results are displayed) Labs Reviewed - No data to display  EKG   Radiology No results found.  Procedures Procedures (including critical care time)  Medications Ordered in UC Medications - No data to display  Initial Impression / Assessment and Plan / UC Course  I have reviewed the triage vital signs and the nursing notes.  Pertinent labs & imaging results that were available during my care of the patient were reviewed by me and considered in  my medical decision making (see chart for details).    Will treat to cover sinusitis with Augmentin.  Recommend follow-up if no gradual improvement with any further concerns.  Final Clinical Impressions(s) / UC Diagnoses   Final diagnoses:  Acute sinusitis, recurrence not specified, unspecified location   Discharge Instructions   None    ED Prescriptions     Medication Sig Dispense Auth. Provider   amoxicillin-clavulanate (AUGMENTIN) 875-125 MG  tablet  (Status: Discontinued) Take 1 tablet by mouth every 12 (twelve) hours. 14 tablet Erma Pinto F, PA-C   amoxicillin-clavulanate (AUGMENTIN) 875-125 MG tablet Take 1 tablet by mouth every 12 (twelve) hours. 14 tablet Tomi Bamberger, PA-C      PDMP not reviewed this encounter.   Tomi Bamberger, PA-C 12/17/22 [phone]

## 2022-12-19 ENCOUNTER — Ambulatory Visit: Payer: 59

## 2022-12-19 VITALS — BP 130/80 | HR 106 | Temp 97.9°F | Resp 20 | Ht 68.0 in | Wt 180.8 lb

## 2022-12-19 DIAGNOSIS — Z8781 Personal history of (healed) traumatic fracture: Secondary | ICD-10-CM

## 2022-12-19 DIAGNOSIS — Z9889 Other specified postprocedural states: Secondary | ICD-10-CM | POA: Diagnosis not present

## 2022-12-19 DIAGNOSIS — M810 Age-related osteoporosis without current pathological fracture: Secondary | ICD-10-CM

## 2022-12-19 MED ORDER — ROMOSOZUMAB-AQQG 105 MG/1.17ML ~~LOC~~ SOSY
210.0000 mg | PREFILLED_SYRINGE | Freq: Once | SUBCUTANEOUS | Status: AC
Start: 2022-12-19 — End: 2022-12-19
  Administered 2022-12-19: 210 mg via SUBCUTANEOUS
  Filled 2022-12-19: qty 2.34

## 2022-12-19 NOTE — Progress Notes (Signed)
Diagnosis: Osteoporosis  Provider:  Mannam, Praveen MD  Procedure: Injection  Evenity (Romosozumab-aqqg), Dose: 210 mg, Site: subcutaneous, Number of injections: 2  Post Care:     Discharge: Condition: Good, Destination: Home . AVS Declined  Performed by:  Camaron Cammack, RN       

## 2023-01-03 ENCOUNTER — Ambulatory Visit: Payer: 59 | Admitting: Family Medicine

## 2023-01-23 ENCOUNTER — Ambulatory Visit: Payer: 59

## 2023-01-23 VITALS — BP 130/83 | HR 113 | Temp 98.3°F | Resp 14 | Ht 68.0 in | Wt 183.6 lb

## 2023-01-23 DIAGNOSIS — Z8781 Personal history of (healed) traumatic fracture: Secondary | ICD-10-CM

## 2023-01-23 DIAGNOSIS — M810 Age-related osteoporosis without current pathological fracture: Secondary | ICD-10-CM | POA: Diagnosis not present

## 2023-01-23 DIAGNOSIS — Z9889 Other specified postprocedural states: Secondary | ICD-10-CM

## 2023-01-23 MED ORDER — ROMOSOZUMAB-AQQG 105 MG/1.17ML ~~LOC~~ SOSY
210.0000 mg | PREFILLED_SYRINGE | Freq: Once | SUBCUTANEOUS | Status: AC
  Administered 2023-01-23: 210 mg via SUBCUTANEOUS

## 2023-01-23 NOTE — Progress Notes (Signed)
 Diagnosis: Osteoporosis  Provider:  Mannam, Praveen MD  Procedure: Injection  Evenity  (Romosozumab -aqqg), Dose: 210 mg, Site: subcutaneous, Number of injections: 2  Injection Site(s): Left arm and Right arm  Post Care:  left and right arm injections  Discharge: Condition: Good, Destination: Home . AVS Declined  Performed by:  Lauran Pollard, LPN

## 2023-01-24 ENCOUNTER — Ambulatory Visit
Admission: RE | Admit: 2023-01-24 | Discharge: 2023-01-24 | Disposition: A | Discharge status: Home / Self Care | Payer: 59 | Source: Ambulatory Visit | Attending: Family Medicine | Admitting: Family Medicine

## 2023-01-24 ENCOUNTER — Other Ambulatory Visit: Payer: Self-pay

## 2023-01-24 VITALS — BP 131/83 | HR 112 | Temp 98.8°F | Resp 18

## 2023-01-24 DIAGNOSIS — R0981 Nasal congestion: Secondary | ICD-10-CM

## 2023-01-24 DIAGNOSIS — J22 Unspecified acute lower respiratory infection: Secondary | ICD-10-CM

## 2023-01-24 MED ORDER — AMOXICILLIN-POT CLAVULANATE 875-125 MG PO TABS: 1.0000 | ORAL_TABLET | Freq: Two times a day (BID) | ORAL | 0 refills | Status: AC

## 2023-01-24 MED ORDER — PREDNISONE 20 MG PO TABS: 20.0000 mg | ORAL_TABLET | Freq: Every day | ORAL | 0 refills | Status: AC

## 2023-01-24 NOTE — ED Provider Notes (Signed)
 [facility]    CSN: [id number] Arrival date & time: 01/24/23  1445      History   Chief Complaint Chief Complaint  Patient presents with   Nasal Congestion    With cough - Entered by patient    HPI Ann Newton is a 68 y.o. female.   HPI Patient here today with worsening cough and nasal congestion x 2 weeks.  Patient was seen out of town and treated with Augmentin  for upper respiratory infection.  She completed 4 days of a 5-day treatment and reports that her husband took 2 doses of her medication and she has not completely gotten better.  She reports that the cough is persistent and occasionally productive.  She has had worsening sinus pressure and nasal congestion.  She was prescribed Tessalon Perles and reports that she has been taking them consistently with minimal relief of cough. Past Medical History:  Diagnosis Date   Arthritis    Atopic dermatitis    Complication of anesthesia    woke up early during colonoscopy and dental implant   Fever blister    Hyperlipidemia    diet controlled   Need for shingles vaccine 11/22/2017   Neuropathy 11/22/2017   Psoriasis    Psoriatic arthritis (HCC)    Thyroid  disease    as a child, no problems as an adult   Wears contact lenses     Patient Active Problem List   Diagnosis Date Noted   Abnormal weight gain 07/06/2022   Age-related osteoporosis without current pathological fracture 05/06/2022   History of kyphoplasty 05/06/2022   History of vertebral fracture 05/06/2022   Atopic dermatitis 07/26/2021   Elevated blood pressure reading 05/02/2021   Acute non-recurrent pansinusitis 04/07/2021   Weight loss 02/25/2021   Fever blister 02/04/2021   Edema 02/04/2021   Thyroid  nodule greater than or equal to 1 cm in diameter incidentally noted on imaging study 11/20/2018   Fatigue 11/09/2018   Elevated glucose 11/09/2018   Snores 11/09/2018   Hair loss 11/09/2018   Need for influenza vaccination 11/09/2018    Thyromegaly 11/09/2018   Allergic reaction 05/17/2018   Maculopapular rash 05/17/2018   Family history of rheumatoid arthritis 03/09/2018   History of vitamin D  deficiency 03/09/2018   Primary osteoarthritis of both knees 02/22/2018   Primary osteoarthritis of both hands 02/22/2018   Chronic pain of both knees 11/22/2017   Vitamin D  deficiency 01/24/2016   Smoking hx- ( less 1 ppd * 15 yrs) - Quit Jan 17 2013 12/28/2015   Elevated LDL cholesterol level 12/28/2015   Elevated vitamin B12 level 12/28/2015   h/o Mild peripheral edema- takes HCTZ for this 12/20/2015   Dietary B12 deficiency 12/20/2015   History of chickenpox ( Aug '17 )  12/20/2015   Healthcare maintenance 12/20/2015   BMI 27.0-27.9,adult 12/03/2015   h/o Hypertriglyceridemia 12/03/2015   Mixed hyperlipidemia 12/03/2015   Pustular psoriasis of palms and soles 10/28/2010    Past Surgical History:  Procedure Laterality Date   ABDOMINAL HYSTERECTOMY  2000   part   ANTERIOR AND POSTERIOR REPAIR N/A 09/08/2016   Procedure: ANTERIOR (CYSTOCELE) AND POSTERIOR REPAIR (RECTOCELE)/Perineorrhaphy;  Surgeon: Gorge Ade, MD;  Location: [facility];  Service: Gynecology;  Laterality: N/A;  Requests . Repair of enterocele/posterior   COLONOSCOPY     ~12 yrs ago- normal per pt    COSMETIC SURGERY  1999   breast implants   dental implants     with sedation   HAGLAND'S DEFORMITY  EXCISION Left 09/13/2013   Procedure: LEFT FOOT: EXCISION INTERDIGITAL MORTON'S NEUROMA SINGLE EACH;  Surgeon: LELON JONETTA Shari Mickey., MD;  Location: [facility];  Service: Orthopedics;  Laterality: Left;   KNEE ARTHROSCOPY  2013,2011   right/left   KYPHOPLASTY N/A 02/18/2022   Procedure: THORACIC NINE, THORACIC TEN KYPHOPLASTY;  Surgeon: Gillie Duncans, MD;  Location: [facility];  Service: Neurosurgery;  Laterality: N/A;  RM 19   SHOULDER ARTHROSCOPY     rt   TUBAL LIGATION     WISDOM TOOTH EXTRACTION      OB History   No obstetric history on  file.      Home Medications    Prior to Admission medications   Medication Sig Start Date End Date Taking? Authorizing Provider  amoxicillin -clavulanate (AUGMENTIN ) 875-125 MG tablet Take 1 tablet by mouth 2 (two) times daily for 3 days. 01/24/23 01/27/23 Yes Arloa Suzen RAMAN, NP  predniSONE  (DELTASONE ) 20 MG tablet Take 1 tablet (20 mg total) by mouth daily with breakfast for 5 days. 01/24/23 01/29/23 Yes Arloa Suzen RAMAN, NP  acyclovir  (ZOVIRAX ) 400 MG tablet Take 1 tablet po TID for 5 days. Begin taking at first sign of fever blister 10/11/22   Chandra Toribio POUR, MD  calcipotriene  (DOVONOX) 0.005 % ointment APPLY TO AFFECTED AREA ON THE SKIN DAILY FOR 2 WEEKS. 11/17/22   Cheryl Waddell HERO, PA-C  clobetasol  ointment (TEMOVATE ) 0.05 % Apply to affected area up to two times daily for up to two weeks. 11/17/22   Cheryl Waddell HERO, PA-C  diclofenac  sodium (VOLTAREN ) 1 % GEL 3 grams to 3 large joints up to 3 times daily Patient taking differently: Apply 2 g topically daily as needed (Arthritis). 03/16/18   Dolphus Reiter, MD  EFUDEX 5 % cream Apply 1 application  topically daily as needed (Psoriasis). 12/03/20   [provider]  hydrochlorothiazide  (HYDRODIURIL ) 12.5 MG tablet Take 1 tablet (12.5 mg total) by mouth daily. 10/11/22   Chandra Toribio POUR, MD  Multiple Vitamins-Minerals (CENTRUM SILVER 50+WOMEN) TABS Take 1 tablet by mouth daily.    [provider]  phentermine  (ADIPEX-P ) 37.5 MG tablet Take 1 tablet (37.5 mg total) by mouth daily before breakfast. 10/11/22   Chandra Toribio POUR, MD  pseudoephedrine-guaifenesin (MUCINEX D) 60-600 MG 12 hr tablet Take 1 tablet by mouth every 12 (twelve) hours.    [provider]  Romosozumab -aqqg (EVENITY ) 105 MG/1. SOSY injection Inject 210 mg into the skin every 30 (thirty) days. Last injection: 11-21-2022    Cheryl Waddell HERO, PA-C  TREMFYA  100 MG/ML pen INJECT 1 PEN SUBCUTANEOUSLY  EVERY 8 WEEKS Patient taking differently: Last Injection:  12-02-2022 11/14/22   Dolphus Reiter, MD  triamcinolone  cream (KENALOG ) 0.1 % Apply 1 Application topically 2 (two) times daily. 11/28/22   [provider]    Family History Family History  Problem Relation Age of Onset   Kidney disease Mother    Diabetes Mother    Heart attack Father    Rheum arthritis Brother    Rheum arthritis Brother    Heart attack Maternal Grandmother    Heart attack Maternal Grandfather    Cancer Paternal Grandmother    Heart attack Paternal Grandfather    Healthy Daughter    Healthy Son    Colon cancer Neg Hx    Colon polyps Neg Hx    Esophageal cancer Neg Hx    Rectal cancer Neg Hx    Stomach cancer Neg Hx     Social  History Social History   Tobacco Use   Smoking status: Former    Current packs/day: 0.00    Average packs/day: 0.5 packs/day for 10.0 years (5.0 ttl pk-yrs)    Types: Cigarettes    Start date: 09/13/2002    Quit date: 09/12/2012    Years since quitting: 10.3    Passive exposure: Current   Smokeless tobacco: Never  Vaping Use   Vaping status: Never Used  Substance Use Topics   Alcohol use: No   Drug use: No     Allergies   Patient has no known allergies.   Review of Systems Review of Systems Pertinent negatives listed in HPI   Physical Exam Triage Vital Signs ED Triage Vitals  Encounter Vitals Group     BP      Systolic BP Percentile      Diastolic BP Percentile      Pulse      Resp      Temp      Temp src      SpO2      Weight      Height      Head Circumference      Peak Flow      Pain Score      Pain Loc      Pain Education      Exclude from Growth Chart    No data found.  Updated Vital Signs BP 131/83 (BP Location: Right Arm)   Pulse (!) 112   Temp 98.8 F (37.1 C) (Oral)   Resp 18   SpO2 95%   Visual Acuity Right Eye Distance:   Left Eye Distance:   Bilateral Distance:    Right Eye Near:   Left Eye Near:    Bilateral Near:     Physical Exam Constitutional:       Appearance: She is well-developed. She is ill-appearing.  HENT:     Head: Normocephalic and atraumatic.     Right Ear: Tympanic membrane and ear canal normal.     Left Ear: Tympanic membrane and ear canal normal.     Nose: Congestion and rhinorrhea present.     Mouth/Throat:     Mouth: No oral lesions.     Pharynx: Uvula midline. Pharyngeal swelling and uvula swelling present. No oropharyngeal exudate or posterior oropharyngeal erythema.     Tonsils: No tonsillar exudate or tonsillar abscesses. 0 on the right. 0 on the left.  Eyes:     Extraocular Movements: Extraocular movements intact.     Conjunctiva/sclera: Conjunctivae normal.     Pupils: Pupils are equal, round, and reactive to light.  Cardiovascular:     Rate and Rhythm: Normal rate and regular rhythm.  Pulmonary:     Effort: Pulmonary effort is normal. No respiratory distress.     Breath sounds: Normal breath sounds and air entry. No stridor.  Musculoskeletal:     Cervical back: Normal range of motion and neck supple.  Lymphadenopathy:     Cervical: No cervical adenopathy.  Skin:    General: Skin is warm and dry.     Capillary Refill: Capillary refill takes less than 2 seconds.  Neurological:     General: No focal deficit present.     Mental Status: She is alert and oriented to person, place, and time.  Psychiatric:        Mood and Affect: Mood normal.        Behavior: Behavior normal.      UC Treatments /  Results  Labs (all labs ordered are listed, but only abnormal results are displayed) Labs Reviewed - No data to display  EKG   Radiology No results found.  Procedures Procedures (including critical care time)  Medications Ordered in UC Medications - No data to display  Initial Impression / Assessment and Plan / UC Course  I have reviewed the triage vital signs and the nursing notes.  Pertinent labs & imaging results that were available during my care of the patient were reviewed by me and considered  in my medical decision making (see chart for details).    1. Lower respiratory infection (Primary) - amoxicillin -clavulanate (AUGMENTIN ) 875-125 MG tablet; Take 1 tablet by mouth 2 (two) times daily for 3 days.  Dispense: 6 tablet; Refill: 0 Medication additional 3 days as patient did not complete the entire course of medication that was initially prescribed to her. -For persistent productive cough,  predniSONE  (DELTASONE ) 20 MG tablet; Take 1 tablet (20 mg total) by mouth daily with breakfast for 5 days.  Dispense: 5 tablet; Refill: 0   2. Sinus congestion - predniSONE  (DELTASONE ) 20 MG tablet; Take 1 tablet (20 mg total) by mouth daily with breakfast for 5 days.  Dispense: 5 tablet; Refill: 0 - amoxicillin -clavulanate (AUGMENTIN ) 875-125 MG tablet; Take 1 tablet by mouth 2 (two) times daily for 3 days.  Dispense: 6 tablet; Refill: 0   Final Clinical Impressions(s) / UC Diagnoses   Final diagnoses:  Lower respiratory infection  Sinus congestion   Discharge Instructions   None    ED Prescriptions     Medication Sig Dispense Auth. Provider   predniSONE  (DELTASONE ) 20 MG tablet Take 1 tablet (20 mg total) by mouth daily with breakfast for 5 days. 5 tablet Arloa Suzen RAMAN, NP   amoxicillin -clavulanate (AUGMENTIN ) 875-125 MG tablet Take 1 tablet by mouth 2 (two) times daily for 3 days. 6 tablet Arloa Suzen RAMAN, NP      PDMP not reviewed this encounter.   Arloa Suzen RAMAN, NP 01/24/23 1743

## 2023-01-24 NOTE — ED Triage Notes (Signed)
 Pt here for cough and congestion x 1 week; pt sts given augmentin last week but still not improved; pt sts some worse today

## 2023-02-20 ENCOUNTER — Ambulatory Visit: Payer: 59

## 2023-02-20 VITALS — BP 131/82 | HR 120 | Temp 98.7°F | Resp 18 | Ht 68.0 in | Wt 188.8 lb

## 2023-02-20 DIAGNOSIS — M810 Age-related osteoporosis without current pathological fracture: Secondary | ICD-10-CM | POA: Diagnosis not present

## 2023-02-20 DIAGNOSIS — Z8781 Personal history of (healed) traumatic fracture: Secondary | ICD-10-CM

## 2023-02-20 DIAGNOSIS — Z9889 Other specified postprocedural states: Secondary | ICD-10-CM | POA: Diagnosis not present

## 2023-02-20 MED ORDER — ROMOSOZUMAB-AQQG 105 MG/1.17ML ~~LOC~~ SOSY
210.0000 mg | PREFILLED_SYRINGE | Freq: Once | SUBCUTANEOUS | Status: AC
  Administered 2023-02-20: 210 mg via SUBCUTANEOUS

## 2023-02-20 NOTE — Progress Notes (Signed)
Diagnosis: Osteoporosis  Provider:  Chilton Greathouse MD  Procedure: Injection  Evenity (Romosozumab-aqqg), Dose: 210 mg, Site: subcutaneous, Number of injections: 2  Injection Site(s): Left arm and right arm  Post Care:  left and right arm injection  Discharge: Condition: Good, Destination: Home . AVS Declined  Performed by:  Rico Ala, LPN

## 2023-03-06 ENCOUNTER — Other Ambulatory Visit: Payer: Self-pay | Admitting: Rheumatology

## 2023-03-06 ENCOUNTER — Other Ambulatory Visit: Payer: Self-pay | Admitting: *Deleted

## 2023-03-06 DIAGNOSIS — L4050 Arthropathic psoriasis, unspecified: Secondary | ICD-10-CM

## 2023-03-06 DIAGNOSIS — L403 Pustulosis palmaris et plantaris: Secondary | ICD-10-CM

## 2023-03-06 DIAGNOSIS — Z79899 Other long term (current) drug therapy: Secondary | ICD-10-CM

## 2023-03-06 NOTE — Telephone Encounter (Signed)
Last Fill: 11/14/2022  Labs: 11/17/2022 Glucose is 111. Rest of CMP WNL Platelet count remains elevated but continues to trend down. Rest of CBC WNL.  TB Gold: 08/17/2022 Neg    Next Visit: 04/17/2023  Last Visit: 11/17/2022  [id number] arthropathy   Current Dose per office note 11/17/2022: Tremfya 100 mg subcutaneous injections every 8 weeks.   Patient advised she is due to update labs. Patient states she plans on trying to come today to update.   Okay to refill Tremfyia?

## 2023-03-07 LAB — CBC WITH DIFFERENTIAL/PLATELET
Absolute Lymphocytes: 3504 {cells}/uL (ref 850–3900)
Absolute Monocytes: 637 {cells}/uL (ref 200–950)
Basophils Absolute: 27 {cells}/uL (ref 0–200)
Basophils Relative: 0.3 %
Eosinophils Absolute: 200 {cells}/uL (ref 15–500)
Eosinophils Relative: 2.2 %
HCT: 40 % (ref 35.0–45.0)
Hemoglobin: 13.6 g/dL (ref 11.7–15.5)
MCH: 30.6 pg (ref 27.0–33.0)
MCHC: 34 g/dL (ref 32.0–36.0)
MCV: 90.1 fL (ref 80.0–100.0)
MPV: 10.2 fL (ref 7.5–12.5)
Monocytes Relative: 7 %
Neutro Abs: 4732 {cells}/uL (ref 1500–7800)
Neutrophils Relative %: 52 %
Platelets: 400 10*3/uL (ref 140–400)
RBC: 4.44 10*6/uL (ref 3.80–5.10)
RDW: 12.8 % (ref 11.0–15.0)
Total Lymphocyte: 38.5 %
WBC: 9.1 10*3/uL (ref 3.8–10.8)

## 2023-03-07 LAB — COMPLETE METABOLIC PANEL WITH GFR
AG Ratio: 1.6 (calc) (ref 1.0–2.5)
ALT: 35 U/L — ABNORMAL HIGH (ref 6–29)
AST: 28 U/L (ref 10–35)
Albumin: 4.1 g/dL (ref 3.6–5.1)
Alkaline phosphatase (APISO): 117 U/L (ref 37–153)
BUN: 16 mg/dL (ref 7–25)
CO2: 28 mmol/L (ref 20–32)
Calcium: 9.4 mg/dL (ref 8.6–10.4)
Chloride: 102 mmol/L (ref 98–110)
Creat: 0.73 mg/dL (ref 0.50–1.05)
Globulin: 2.6 g/dL (ref 1.9–3.7)
Glucose, Bld: 144 mg/dL — ABNORMAL HIGH (ref 65–99)
Potassium: 4.2 mmol/L (ref 3.5–5.3)
Sodium: 139 mmol/L (ref 135–146)
Total Bilirubin: 0.4 mg/dL (ref 0.2–1.2)
Total Protein: 6.7 g/dL (ref 6.1–8.1)
eGFR: 91 mL/min/{1.73_m2} (ref >=60)

## 2023-03-07 NOTE — Progress Notes (Signed)
Glucose is 144.   ALT is elevated-35--please clarify if she has been taking any tylenol or alcohol? Rest of CMP WNL.  CBC WNL.

## 2023-03-20 ENCOUNTER — Ambulatory Visit: Payer: 59 | Admitting: *Deleted

## 2023-03-20 ENCOUNTER — Ambulatory Visit: Payer: 59

## 2023-03-20 VITALS — BP 139/85 | HR 106 | Temp 98.5°F | Resp 18 | Ht 68.0 in | Wt 190.2 lb

## 2023-03-20 DIAGNOSIS — Z8781 Personal history of (healed) traumatic fracture: Secondary | ICD-10-CM

## 2023-03-20 DIAGNOSIS — Z9889 Other specified postprocedural states: Secondary | ICD-10-CM

## 2023-03-20 DIAGNOSIS — M810 Age-related osteoporosis without current pathological fracture: Secondary | ICD-10-CM | POA: Diagnosis not present

## 2023-03-20 MED ORDER — ROMOSOZUMAB-AQQG 105 MG/1.17ML ~~LOC~~ SOSY
210.0000 mg | PREFILLED_SYRINGE | Freq: Once | SUBCUTANEOUS | Status: AC
  Administered 2023-03-20: 210 mg via SUBCUTANEOUS
  Filled 2023-03-20: qty 2.34

## 2023-03-20 NOTE — Progress Notes (Signed)
 Diagnosis: Osteoporosis  Provider:  Chilton Greathouse MD  Procedure: Injection  Evenity (Romosozumab-aqqg), Dose: 210 mg, Site: subcutaneous, Number of injections: 2  Injection Site(s): Left arm  Post Care: Observation period completed  Discharge: Condition: Good, Destination: Home . AVS Provided and AVS Declined  Performed by:  Forrest Moron, RN

## 2023-04-03 NOTE — Progress Notes (Signed)
 Office Visit Note  Patient: Ann Newton             Date of Birth: 1955/10/29           MRN: [id number]             PCP: Sandre Kitty, MD Referring: Sandre Kitty, MD Visit Date: 04/17/2023 Occupation: @GUAROCC @  Subjective:  Medication monitoring   History of Present Illness: Ann Newton is a 68 y.o. female with history of psoriatic arthritis.  Patient remains on  Tremfya 100 mg subcutaneous injections every 8 weeks.  Her most recent dose of Tremfya was administered on 03/24/2023.  She is tolerating tremfya without any side effects or injection site reactions.  She denies any increased joint pain or joint swelling.  She has been experiencing some symptoms of plantar fasciitis in the right foot first thing in the morning.  She takes advil sparingly for pain relief. She has some redness and scaling on the right palm and both feet.  She does not need any refills of topical agents. She has an upcoming appointment with dermatology in may. Patient was initiated on Evenity on 06/06/2022.  She received her 12th dose of Evenity today on 04/17/2023.    Activities of Daily Living:  Patient reports morning stiffness for 1 hour.   Patient Denies nocturnal pain.  Difficulty dressing/grooming: Denies Difficulty climbing stairs: Denies Difficulty getting out of chair: Denies Difficulty using hands for taps, buttons, cutlery, and/or writing: Denies  Review of Systems  Constitutional:  Negative for fatigue.  HENT:  Negative for mouth sores, mouth dryness and nose dryness.   Eyes:  Negative for pain and dryness.  Respiratory:  Negative for shortness of breath and difficulty breathing.   Cardiovascular:  Negative for chest pain and palpitations.  Gastrointestinal:  Negative for blood in stool, constipation and diarrhea.  Endocrine: Negative for increased urination.  Genitourinary:  Negative for involuntary urination.  Musculoskeletal:  Positive for joint pain, joint pain, joint swelling  and morning stiffness. Negative for gait problem, myalgias, muscle weakness, muscle tenderness and myalgias.  Skin:  Negative for color change, rash, hair loss and sensitivity to sunlight.  Allergic/Immunologic: Negative for susceptible to infections.  Neurological:  Negative for dizziness and headaches.  Hematological:  Negative for swollen glands.  Psychiatric/Behavioral:  Negative for depressed mood and sleep disturbance. The patient is not nervous/anxious.     PMFS History:  Patient Active Problem List   Diagnosis Date Noted   Abnormal weight gain 07/06/2022   Age-related osteoporosis without current pathological fracture 05/06/2022   History of kyphoplasty 05/06/2022   History of vertebral fracture 05/06/2022   Atopic dermatitis 07/26/2021   Elevated blood pressure reading 05/02/2021   Acute non-recurrent pansinusitis 04/07/2021   Weight loss 02/25/2021   Fever blister 02/04/2021   Edema 02/04/2021   Thyroid nodule greater than or equal to 1 cm in diameter incidentally noted on imaging study 11/20/2018   Fatigue 11/09/2018   Elevated glucose 11/09/2018   Snores 11/09/2018   Hair loss 11/09/2018   Need for influenza vaccination 11/09/2018   Thyromegaly 11/09/2018   Allergic reaction 05/17/2018   Maculopapular rash 05/17/2018   Family history of rheumatoid arthritis 03/09/2018   History of vitamin D deficiency 03/09/2018   Primary osteoarthritis of both knees 02/22/2018   Primary osteoarthritis of both hands 02/22/2018   Chronic pain of both knees 11/22/2017   Vitamin D deficiency 01/24/2016   Smoking hx- ( less 1 ppd * 15  yrs) - Quit Jan 17 2013 12/28/2015   Elevated LDL cholesterol level 12/28/2015   Elevated vitamin B12 level 12/28/2015   h/o Mild peripheral edema- takes HCTZ for this 12/20/2015   Dietary B12 deficiency 12/20/2015   History of chickenpox ( Aug '17 )  12/20/2015   Healthcare maintenance 12/20/2015   BMI 27.0-27.9,adult 12/03/2015   h/o  Hypertriglyceridemia 12/03/2015   Mixed hyperlipidemia 12/03/2015   Pustular psoriasis of palms and soles 10/28/2010    Past Medical History:  Diagnosis Date   Arthritis    Atopic dermatitis    Complication of anesthesia    woke up early during colonoscopy and dental implant   Fever blister    Hyperlipidemia    diet controlled   Need for shingles vaccine 11/22/2017   Neuropathy 11/22/2017   Psoriasis    Psoriatic arthritis (HCC)    Thyroid disease    as a child, no problems as an adult   Wears contact lenses     Family History  Problem Relation Age of Onset   Kidney disease Mother    Diabetes Mother    Heart attack Father    Rheum arthritis Brother    Rheum arthritis Brother    Heart attack Maternal Grandmother    Heart attack Maternal Grandfather    Cancer Paternal Grandmother    Heart attack Paternal Grandfather    Healthy Daughter    Healthy Son    Colon cancer Neg Hx    Colon polyps Neg Hx    Esophageal cancer Neg Hx    Rectal cancer Neg Hx    Stomach cancer Neg Hx    Past Surgical History:  Procedure Laterality Date   ABDOMINAL HYSTERECTOMY  2000   part   ANTERIOR AND POSTERIOR REPAIR N/A 09/08/2016   Procedure: ANTERIOR (CYSTOCELE) AND POSTERIOR REPAIR (RECTOCELE)/Perineorrhaphy;  Surgeon: Olivia Mackie, MD;  Location: [facility];  Service: Gynecology;  Laterality: N/A;  Requests . Repair of enterocele/posterior   COLONOSCOPY     ~12 yrs ago- normal per pt    COSMETIC SURGERY  1999   breast implants   dental implants     with sedation   HAGLAND'S DEFORMITY EXCISION Left 09/13/2013   Procedure: LEFT FOOT: EXCISION INTERDIGITAL MORTON'S NEUROMA SINGLE EACH;  Surgeon: Thera Flake., MD;  Location: [facility];  Service: Orthopedics;  Laterality: Left;   KNEE ARTHROSCOPY  2013,2011   right/left   KYPHOPLASTY N/A 02/18/2022   Procedure: THORACIC NINE, THORACIC TEN KYPHOPLASTY;  Surgeon: Coletta Memos, MD;  Location: [facility];  Service:  Neurosurgery;  Laterality: N/A;  RM 19   SHOULDER ARTHROSCOPY     rt   TUBAL LIGATION     WISDOM TOOTH EXTRACTION     Social History   Social History Narrative   Right handed    Immunization History  Administered Date(s) Administered   Fluad Trivalent(High Dose 65+) 10/11/2022   Influenza,inj,Quad PF,6+ Mos 12/03/2015, 11/22/2017, 11/09/2018, 12/11/2019, 02/02/2021   Moderna Sars-Covid-2 Vaccination 08/13/2019, 08/19/2019, 09/16/2019   PNEUMOCOCCAL CONJUGATE-20 10/11/2022   Pneumococcal Polysaccharide-23 11/22/2017   Tdap 02/09/2009, 05/29/2019   Zoster Recombinant(Shingrix) 11/09/2018, 05/29/2019     Objective: Vital Signs: BP 122/73 (BP Location: Left Arm, Patient Position: Sitting, Cuff Size: Normal)   Pulse 74   Resp 16   Ht 5\' 8"  (1.727 m)   Wt 192 lb 9.6 oz (87.4 kg)   BMI 29.28 kg/m    Physical Exam Vitals and nursing note reviewed.  Constitutional:  Appearance: She is well-developed.  HENT:     Head: Normocephalic and atraumatic.  Eyes:     Conjunctiva/sclera: Conjunctivae normal.  Cardiovascular:     Rate and Rhythm: Normal rate and regular rhythm.     Heart sounds: Normal heart sounds.  Pulmonary:     Effort: Pulmonary effort is normal.     Breath sounds: Normal breath sounds.  Abdominal:     General: Bowel sounds are normal.     Palpations: Abdomen is soft.  Musculoskeletal:     Cervical back: Normal range of motion.  Lymphadenopathy:     Cervical: No cervical adenopathy.  Skin:    General: Skin is warm and dry.     Capillary Refill: Capillary refill takes less than 2 seconds.  Neurological:     Mental Status: She is alert and oriented to person, place, and time.  Psychiatric:        Behavior: Behavior normal.      Musculoskeletal Exam: Shoulder joints, elbow joints, wrist joints MCPs, PIPs, DIPs have good range of motion with no synovitis.  PIP and DIP thickening consistent with osteoarthritis of both hands.  Hip joints have good range of  motion with no groin pain.  Tenderness over the right trochanteric bursa.  Knee joints have good range of motion with no warmth or effusion.  Ankle joints have good range of motion with no tenderness or joint swelling.  Some tenderness over the plantar fascia of the right foot.  CDAI Exam: CDAI Score: -- Patient Global: --; Provider Global: -- Swollen: --; Tender: -- Joint Exam 04/17/2023   No joint exam has been documented for this visit   There is currently no information documented on the homunculus. Go to the Rheumatology activity and complete the homunculus joint exam.  Investigation: No additional findings.  Imaging: No results found.  Recent Labs: Lab Results  Component Value Date   WBC 9.1 03/06/2023   HGB 13.6 03/06/2023   PLT 400 03/06/2023   NA 139 03/06/2023   K 4.2 03/06/2023   CL 102 03/06/2023   CO2 28 03/06/2023   GLUCOSE 144 (H) 03/06/2023   BUN 16 03/06/2023   CREATININE 0.73 03/06/2023   BILITOT 0.4 03/06/2023   ALKPHOS 141 (H) 01/28/2021   AST 28 03/06/2023   ALT 35 (H) 03/06/2023   PROT 6.7 03/06/2023   ALBUMIN 4.5 01/28/2021   CALCIUM 9.4 03/06/2023   GFRAA 92 07/09/2020   QFTBGOLDPLUS NEGATIVE 08/17/2022    Speciality Comments: Otezla stopped 08/01/20 Tremfya started 08/03/20 - stopped 08/10/21 Cosentyx started 10/06/21- stopped 12/01/21; Skyrizi started 12/30/21  Procedures:  No procedures performed Allergies: Patient has no known allergies.     Assessment / Plan:     Visit Diagnoses: Psoriatic arthropathy (HCC): No synovitis or dactylitis noted on examination today.  She has some tenderness along the plantar fascia of the right foot but no evidence of Achilles tendinitis.  No SI joint pain.  Overall her symptoms have been manageable on Tremfya 100 mg sq injections every 8 weeks.  She is tolerating Tremfya without any side effects or injection site reactions.  No medication changes will be made at this time.  She was advised to notify us if  she develops any new or worsening symptoms.  She will follow up in 5 months or sooner if needed.   High risk medication use - Tremfya 100 mg subcutaneous injections every 8 weeks.Tremfya restarted on 06/21/2022.IR Cosentyx, Skyrizi, and [place].  D/c methotrexate due to side  effects. CBC and CMP updated on 03/06/23. Her next lab work will be due in May and every 3 months.  TB gold negative on 08/17/22.  No recurrent infections. Discussed the importance of holding tremfya if she develops signs or symptoms of an infection and to resume once the infection has completely cleared.    Pustular psoriasis of palms and soles: Previous therapy: light therapy and topical agents.  Follow-up visit scheduled with dermatology in May 2025.  Patient uses calcipotriene ointment and clobetasol ointment as needed.  Overall her pustular psoriasis is well-controlled on Tremfya.  She tends to experience breakthrough symptoms a couple weeks prior to her Tremfya dose every 8 weeks.  Discussed the option of combination therapy-could consider restarting otezla.  She would like to hold off at this time.  Also discussed the option of vtama as a steroid sparing topical agent.  Patient plans on further discussing options with dermatology.   Age-related osteoporosis without current pathological fracture - DEXA 03/14/22: The BMD LFN 0.719 T-score of -2.3. T9 and T10 kyphoplasty on 02/18/2022. Evenity dose: 06/06/2022, 07/04/2022, 08/01/2022, 08/29/2022, 09/26/2022, 10/24/2022, 11/21/2022, 12/19/2022, 01/23/2023, 02/20/2023, 03/20/2023, 04/17/2023--last dose received today.   Different treatment options were discussed today.  Patient is in agreement to initiate IV Reclast pending insurance approval.  Reviewed indications, contraindications, potential side effects of IV Reclast today in detail. An informational handout about reclast was provided. All questions were addressed.   Plan to update DEXA Spring 2026.   History of kyphoplasty: T9 and T10  02/18/22  History of vertebral fracture: Underwent T9 and T10 kyphoplasty on 02/18/2022.  History of vitamin D deficiency: Vitamin D was 39 on 08/17/2022.  Primary osteoarthritis of both hands: She has PIP and DIP thickening consistent with osteoarthritis of both hands.  No synovitis or dactylitis noted.  Complete fist formation bilaterally.  Primary osteoarthritis of both knees: She has good range of motion of both knee joints on examination today.  No warmth or effusion noted.  Primary osteoarthritis of both hips: Patient has good range of motion of both hip joints on examination today.  No groin pain.  She is some tenderness over the right trochanteric bursa.  Courage patient to perform daily stretching exercises.  Other medical conditions are listed as follows:   Paresthesia of both feet  Family history of rheumatoid arthritis  h/o Hypertriglyceridemia  Smoking hx- ( less 1 ppd * 15 yrs) - Quit Jan 17 2013  Vitamin D deficiency  Neuropathy  Orders: No orders of the defined types were placed in this encounter.  No orders of the defined types were placed in this encounter.     Follow-Up Instructions: Return in about 5 months (around 09/17/2023) for Psoriatic arthritis.   Gearldine Bienenstock, PA-C  Note - This record has been created using Dragon software.  Chart creation errors have been sought, but may not always  have been located. Such creation errors do not reflect on  the standard of medical care.

## 2023-04-17 ENCOUNTER — Other Ambulatory Visit: Payer: Self-pay | Admitting: Pharmacist

## 2023-04-17 ENCOUNTER — Ambulatory Visit (INDEPENDENT_AMBULATORY_CARE_PROVIDER_SITE_OTHER): Payer: 59 | Admitting: *Deleted

## 2023-04-17 ENCOUNTER — Ambulatory Visit: Payer: 59 | Attending: Physician Assistant | Admitting: Physician Assistant

## 2023-04-17 ENCOUNTER — Encounter: Payer: Self-pay | Admitting: Physician Assistant

## 2023-04-17 ENCOUNTER — Telehealth: Payer: Self-pay

## 2023-04-17 VITALS — BP 122/73 | HR 74 | Resp 16 | Ht 68.0 in | Wt 192.6 lb

## 2023-04-17 VITALS — BP 122/83 | HR 79 | Temp 98.8°F | Resp 16 | Ht 68.0 in | Wt 192.6 lb

## 2023-04-17 DIAGNOSIS — E559 Vitamin D deficiency, unspecified: Secondary | ICD-10-CM

## 2023-04-17 DIAGNOSIS — L4050 Arthropathic psoriasis, unspecified: Secondary | ICD-10-CM

## 2023-04-17 DIAGNOSIS — M19041 Primary osteoarthritis, right hand: Secondary | ICD-10-CM

## 2023-04-17 DIAGNOSIS — M160 Bilateral primary osteoarthritis of hip: Secondary | ICD-10-CM

## 2023-04-17 DIAGNOSIS — Z79899 Other long term (current) drug therapy: Secondary | ICD-10-CM | POA: Diagnosis not present

## 2023-04-17 DIAGNOSIS — M810 Age-related osteoporosis without current pathological fracture: Secondary | ICD-10-CM

## 2023-04-17 DIAGNOSIS — L403 Pustulosis palmaris et plantaris: Secondary | ICD-10-CM | POA: Diagnosis not present

## 2023-04-17 DIAGNOSIS — E781 Pure hyperglyceridemia: Secondary | ICD-10-CM

## 2023-04-17 DIAGNOSIS — M19042 Primary osteoarthritis, left hand: Secondary | ICD-10-CM

## 2023-04-17 DIAGNOSIS — Z8781 Personal history of (healed) traumatic fracture: Secondary | ICD-10-CM | POA: Diagnosis not present

## 2023-04-17 DIAGNOSIS — M170 Bilateral primary osteoarthritis of knee: Secondary | ICD-10-CM

## 2023-04-17 DIAGNOSIS — Z87891 Personal history of nicotine dependence: Secondary | ICD-10-CM

## 2023-04-17 DIAGNOSIS — Z8261 Family history of arthritis: Secondary | ICD-10-CM

## 2023-04-17 DIAGNOSIS — R202 Paresthesia of skin: Secondary | ICD-10-CM

## 2023-04-17 DIAGNOSIS — Z9889 Other specified postprocedural states: Secondary | ICD-10-CM

## 2023-04-17 DIAGNOSIS — Z8639 Personal history of other endocrine, nutritional and metabolic disease: Secondary | ICD-10-CM

## 2023-04-17 DIAGNOSIS — G629 Polyneuropathy, unspecified: Secondary | ICD-10-CM

## 2023-04-17 MED ORDER — ROMOSOZUMAB-AQQG 105 MG/1.17ML ~~LOC~~ SOSY
210.0000 mg | PREFILLED_SYRINGE | Freq: Once | SUBCUTANEOUS | Status: AC
  Administered 2023-04-17: 210 mg via SUBCUTANEOUS
  Filled 2023-04-17: qty 2.34

## 2023-04-17 NOTE — Progress Notes (Signed)
 Diagnosis: Osteoporosis  Provider:  Chilton Greathouse MD  Procedure: Injection  Evenity (Romosozumab-aqqg), Dose: 210 mg, Site: subcutaneous, Number of injections: 2  Injection Site(s): Right arm and Left arm  Post Care: Observation period completed  Discharge: Condition: Good, Destination: Home . AVS Declined  Performed by:  Forrest Moron, RN

## 2023-04-17 NOTE — Addendum Note (Signed)
 Addended by: Ellen Henri on: 04/17/2023 09:41 AM   Modules accepted: Orders

## 2023-04-17 NOTE — Progress Notes (Signed)
 Therapy plan placed for Reclast IV [phone])  for [facility] to start benefits investigation. Last dose of Evenity completed on 04/17/2023  Diagnosis: age-related osteoporosis  Provider: Dr. Pollyann Savoy, Sherron Ales, PA-C  Dose: 5mg  IV every 12 months  Last Clinic Visit: 04/17/2023 Next Clinic Visit: 09/13/2023  Pertinent Labs: CMET and CBC  Chesley Mires, PharmD, MPH, BCPS, CPP Clinical Pharmacist (Rheumatology and Pulmonology)

## 2023-04-17 NOTE — Telephone Encounter (Signed)
 Dr. Corliss Skains and [facility], patient will be scheduled as soon as possible.  Auth Submission: NO AUTH NEEDED Site of care: Site of care: [facility] Payer: [employer] commercial Medication & CPT/J Code(s) submitted: Reclast (Zolendronic acid) [id number] Route of submission (phone, fax, portal): portal Phone # Fax # Auth type: Buy/Bill PB Units/visits requested: 5mg  x 1 dose Reference number: [id number] Approval from: 04/17/23 to 01/17/24

## 2023-04-17 NOTE — Addendum Note (Signed)
 Addended by: Governor Specking A on: 04/17/2023 09:07 AM   Modules accepted: Orders

## 2023-04-17 NOTE — Patient Instructions (Addendum)
 Ann Newton      Standing Labs We placed an order today for your standing lab work.   Please have your standing labs drawn in May and every 3 months   Please have your labs drawn 2 weeks prior to your appointment so that the provider can discuss your lab results at your appointment, if possible.  Please note that you may see your imaging and lab results in MyChart before we have reviewed them. We will contact you once all results are reviewed. Please allow our office up to 72 hours to thoroughly review all of the results before contacting the office for clarification of your results.  WALK-IN LAB HOURS  Monday through Thursday from 8:00 am -12:30 pm and 1:00 pm-5:00 pm and Friday from 8:00 am-12:00 pm.  Patients with office visits requiring labs will be seen before walk-in labs.  You may encounter longer than normal wait times. Please allow additional time. Wait times may be shorter on  Monday and Thursday afternoons.  We do not book appointments for walk-in labs. We appreciate your patience and understanding with our staff.   Labs are drawn by [facility]. Please bring your co-pay at the time of your lab draw.  You may receive a bill from [facility] for your lab work.  Please note if you are on Hydroxychloroquine and and an order has been placed for a Hydroxychloroquine level,  you will need to have it drawn 4 hours or more after your last dose.  If you wish to have your labs drawn at another location, please call the office 24 hours in advance so we can fax the orders.  The office is located at [address], [place], [place] [address]   If you have any questions regarding directions or hours of operation,  please call [phone].   As a reminder, please drink plenty of water prior to coming for your lab work. Thanks!

## 2023-04-21 NOTE — Progress Notes (Signed)
 Reclast scheduled for 06/08/2023

## 2023-05-16 LAB — LAB REPORT - SCANNED
A1c: 5.7
EGFR: 72

## 2023-06-08 ENCOUNTER — Ambulatory Visit

## 2023-06-15 ENCOUNTER — Other Ambulatory Visit: Payer: Self-pay | Admitting: Physician Assistant

## 2023-06-15 DIAGNOSIS — Z79899 Other long term (current) drug therapy: Secondary | ICD-10-CM

## 2023-06-15 DIAGNOSIS — L4050 Arthropathic psoriasis, unspecified: Secondary | ICD-10-CM

## 2023-06-15 DIAGNOSIS — L403 Pustulosis palmaris et plantaris: Secondary | ICD-10-CM

## 2023-06-15 NOTE — Telephone Encounter (Signed)
 Last Fill: 03/06/2023  Labs: 03/06/2023  Glucose is 144.   ALT is elevated-35 Rest of CMP WNL. CBC WNL  Patient advised she is getting labs updated next Tuesday.   TB Gold: 08/17/2022 TB Gold Negative   Next Visit: 09/13/2023  Last Visit: 04/17/2023  [id number] arthropathy   Current Dose per office note 04/17/2023: Tremfya  100 mg sq injections every 8 weeks   Okay to refill Tremfyia?

## 2023-06-20 ENCOUNTER — Other Ambulatory Visit: Payer: Self-pay | Admitting: *Deleted

## 2023-06-20 DIAGNOSIS — Z79899 Other long term (current) drug therapy: Secondary | ICD-10-CM

## 2023-06-21 ENCOUNTER — Ambulatory Visit: Payer: Self-pay | Admitting: Physician Assistant

## 2023-06-21 LAB — CBC WITH DIFFERENTIAL/PLATELET
Absolute Lymphocytes: 2533 {cells}/uL (ref 850–3900)
Absolute Monocytes: 583 {cells}/uL (ref 200–950)
Basophils Absolute: 40 {cells}/uL (ref 0–200)
Basophils Relative: 0.6 %
Eosinophils Absolute: 168 {cells}/uL (ref 15–500)
Eosinophils Relative: 2.5 %
HCT: 41.3 % (ref 35.0–45.0)
Hemoglobin: 13.4 g/dL (ref 11.7–15.5)
MCH: 30.2 pg (ref 27.0–33.0)
MCHC: 32.4 g/dL (ref 32.0–36.0)
MCV: 93 fL (ref 80.0–100.0)
MPV: 10.2 fL (ref 7.5–12.5)
Monocytes Relative: 8.7 %
Neutro Abs: 3377 {cells}/uL (ref 1500–7800)
Neutrophils Relative %: 50.4 %
Platelets: 364 10*3/uL (ref 140–400)
RBC: 4.44 10*6/uL (ref 3.80–5.10)
RDW: 12.2 % (ref 11.0–15.0)
Total Lymphocyte: 37.8 %
WBC: 6.7 10*3/uL (ref 3.8–10.8)

## 2023-06-21 LAB — COMPREHENSIVE METABOLIC PANEL WITH GFR
AG Ratio: 1.6 (calc) (ref 1.0–2.5)
ALT: 22 U/L (ref 6–29)
AST: 19 U/L (ref 10–35)
Albumin: 4.2 g/dL (ref 3.6–5.1)
Alkaline phosphatase (APISO): 99 U/L (ref 37–153)
BUN: 16 mg/dL (ref 7–25)
CO2: 28 mmol/L (ref 20–32)
Calcium: 9.6 mg/dL (ref 8.6–10.4)
Chloride: 102 mmol/L (ref 98–110)
Creat: 0.82 mg/dL (ref 0.50–1.05)
Globulin: 2.7 g/dL (ref 1.9–3.7)
Glucose, Bld: 136 mg/dL — ABNORMAL HIGH (ref 65–99)
Potassium: 4.7 mmol/L (ref 3.5–5.3)
Sodium: 138 mmol/L (ref 135–146)
Total Bilirubin: 0.4 mg/dL (ref 0.2–1.2)
Total Protein: 6.9 g/dL (ref 6.1–8.1)
eGFR: 79 mL/min/{1.73_m2} (ref >=60)

## 2023-06-21 NOTE — Progress Notes (Signed)
 Glucose is 136. Rest of CMP WNL.  ALT returned to WNL.  CBC WNL

## 2023-06-22 ENCOUNTER — Ambulatory Visit

## 2023-06-22 VITALS — BP 118/72 | HR 83 | Temp 97.7°F | Resp 16 | Ht 68.0 in | Wt 189.6 lb

## 2023-06-22 DIAGNOSIS — M810 Age-related osteoporosis without current pathological fracture: Secondary | ICD-10-CM | POA: Diagnosis not present

## 2023-06-22 DIAGNOSIS — Z9889 Other specified postprocedural states: Secondary | ICD-10-CM

## 2023-06-22 DIAGNOSIS — Z8781 Personal history of (healed) traumatic fracture: Secondary | ICD-10-CM

## 2023-06-22 MED ORDER — ACETAMINOPHEN 325 MG PO TABS
650.0000 mg | ORAL_TABLET | Freq: Once | ORAL | Status: AC
  Administered 2023-06-22: 650 mg via ORAL
  Filled 2023-06-22: qty 2

## 2023-06-22 MED ORDER — DIPHENHYDRAMINE HCL 25 MG PO CAPS
25.0000 mg | ORAL_CAPSULE | Freq: Once | ORAL | Status: AC
  Administered 2023-06-22: 25 mg via ORAL
  Filled 2023-06-22: qty 1

## 2023-06-22 MED ORDER — SODIUM CHLORIDE 0.9 % IV SOLN: INTRAVENOUS | Status: DC

## 2023-06-22 MED ORDER — ZOLEDRONIC ACID 5 MG/100ML IV SOLN
5.0000 mg | Freq: Once | INTRAVENOUS | Status: AC
  Administered 2023-06-22: 5 mg via INTRAVENOUS
  Filled 2023-06-22: qty 100

## 2023-06-22 NOTE — Progress Notes (Signed)
 Diagnosis: Osteoporosis  Provider:  Mannam, Praveen MD  Procedure: IV Infusion  IV Type: Peripheral, IV Location: L Hand  Reclast (Zolendronic Acid), Dose: 5 mg  Infusion Start Time: 1457  Infusion Stop Time: 1532  Post Infusion IV Care: Observation period completed and Peripheral IV Discontinued  Discharge: Condition: Good, Destination: Home . AVS Declined  Performed by:  Lendel Quant, RN

## 2023-08-09 ENCOUNTER — Other Ambulatory Visit: Payer: Self-pay | Admitting: Physician Assistant

## 2023-08-09 ENCOUNTER — Other Ambulatory Visit: Payer: Self-pay

## 2023-08-09 DIAGNOSIS — Z111 Encounter for screening for respiratory tuberculosis: Secondary | ICD-10-CM

## 2023-08-09 DIAGNOSIS — L4050 Arthropathic psoriasis, unspecified: Secondary | ICD-10-CM

## 2023-08-09 DIAGNOSIS — Z79899 Other long term (current) drug therapy: Secondary | ICD-10-CM

## 2023-08-09 DIAGNOSIS — L403 Pustulosis palmaris et plantaris: Secondary | ICD-10-CM

## 2023-08-09 NOTE — Telephone Encounter (Signed)
 Last Fill: 06/15/2023  Labs: 06/20/2023 Glucose is 136. Rest of CMP WNL.  ALT returned to WNL.  CBC WNL   TB Gold: 08/17/2022 TB Gold negative    Patient to update TB Gold at next visit.  Next Visit: 09/13/2023  Last Visit: 04/17/2023  DX: Psoriatic arthropathy   Current Dose per office note 04/17/2023: Tremfya  100 mg subcutaneous injections every 8 weeks   Okay to refill Tremfyia?

## 2023-08-31 NOTE — Progress Notes (Unsigned)
 Office Visit Note  Patient: Ann Newton             Date of Birth: 1955-02-04           MRN: [id number]             PCP: Chandra Toribio POUR, MD Referring: Chandra Toribio POUR, MD Visit Date: 09/14/2023 Occupation: @GUAROCC @  Subjective:  Medication monitoring   History of Present Illness: Ann Newton is a 68 y.o. female with history of psoriatic arthritis. She remains on Tremfya 100 mg subcutaneous injections every 8 weeks.  She is tolerating Tremfya without any side effects or injection site reactions.  She is due for her next dose on 09/18/2023.  Patient denies any signs or symptoms of a psoriatic arthritis flare.  She has small outbreak of psoriasis on the plantar aspect of her feet for which she uses topical agents as needed.  Overall the symptoms have been well-controlled on Tremfya.  Patient has noticed some increased discomfort and stiffness in both knees and is planning to proceed with viscosupplementation with orthopedics.  She will also be having hip injections performed under fluoroscopy. She denies any SI joint pain.  She denies any achilles tendonitis or plantar fasciitis.  She denies any recent or recurrent infections. Patient received IV Reclast infusion on 06/22/2023.  She tolerated Reclast without any side effects.      Activities of Daily Living:  Patient reports morning stiffness for 30 minutes.   Patient Reports nocturnal pain.  Difficulty dressing/grooming: Denies Difficulty climbing stairs: Denies Difficulty getting out of chair: Denies Difficulty using hands for taps, buttons, cutlery, and/or writing: Denies  Review of Systems  Constitutional:  Negative for fatigue.  HENT:  Negative for mouth sores and mouth dryness.   Eyes:  Negative for dryness.  Respiratory:  Negative for shortness of breath.   Cardiovascular:  Negative for chest pain and palpitations.  Gastrointestinal:  Negative for blood in stool, constipation and diarrhea.  Endocrine: Negative for  increased urination.  Genitourinary:  Negative for involuntary urination.  Musculoskeletal:  Positive for joint pain, joint pain and morning stiffness. Negative for gait problem, joint swelling, myalgias, muscle weakness, muscle tenderness and myalgias.  Skin:  Positive for rash. Negative for color change, hair loss and sensitivity to sunlight.  Allergic/Immunologic: Negative for susceptible to infections.  Neurological:  Negative for dizziness and headaches.  Hematological:  Negative for swollen glands.  Psychiatric/Behavioral:  Negative for depressed mood and sleep disturbance. The patient is not nervous/anxious.     PMFS History:  Patient Active Problem List   Diagnosis Date Noted   Abnormal weight gain 07/06/2022   Age-related osteoporosis without current pathological fracture 05/06/2022   History of kyphoplasty 05/06/2022   History of vertebral fracture 05/06/2022   Atopic dermatitis 07/26/2021   Elevated blood pressure reading 05/02/2021   Acute non-recurrent pansinusitis 04/07/2021   Weight loss 02/25/2021   Fever blister 02/04/2021   Edema 02/04/2021   Thyroid nodule greater than or equal to 1 cm in diameter incidentally noted on imaging study 11/20/2018   Fatigue 11/09/2018   Elevated glucose 11/09/2018   Snores 11/09/2018   Hair loss 11/09/2018   Need for influenza vaccination 11/09/2018   Thyromegaly 11/09/2018   Allergic reaction 05/17/2018   Maculopapular rash 05/17/2018   Family history of rheumatoid arthritis 03/09/2018   History of vitamin D deficiency 03/09/2018   Primary osteoarthritis of both knees 02/22/2018   Primary osteoarthritis of both hands 02/22/2018   Chronic pain  of both knees 11/22/2017   Vitamin D deficiency 01/24/2016   Smoking hx- ( less 1 ppd * 15 yrs) - Quit Jan 17 2013 12/28/2015   Elevated LDL cholesterol level 12/28/2015   Elevated vitamin B12 level 12/28/2015   h/o Mild peripheral edema- takes HCTZ for this 12/20/2015   Dietary B12  deficiency 12/20/2015   History of chickenpox ( Aug '17 )  12/20/2015   Healthcare maintenance 12/20/2015   BMI 27.0-27.9,adult 12/03/2015   h/o Hypertriglyceridemia 12/03/2015   Mixed hyperlipidemia 12/03/2015   Pustular psoriasis of palms and soles 10/28/2010    Past Medical History:  Diagnosis Date   Arthritis    Atopic dermatitis    Complication of anesthesia    woke up early during colonoscopy and dental implant   Fever blister    Hyperlipidemia    diet controlled   Need for shingles vaccine 11/22/2017   Neuropathy 11/22/2017   Psoriasis    Psoriatic arthritis (HCC)    Thyroid disease    as a child, no problems as an adult   Wears contact lenses     Family History  Problem Relation Age of Onset   Kidney disease Mother    Diabetes Mother    Heart attack Father    Rheum arthritis Brother    Rheum arthritis Brother    Heart attack Maternal Grandmother    Heart attack Maternal Grandfather    Cancer Paternal Grandmother    Heart attack Paternal Grandfather    Healthy Daughter    Healthy Son    Colon cancer Neg Hx    Colon polyps Neg Hx    Esophageal cancer Neg Hx    Rectal cancer Neg Hx    Stomach cancer Neg Hx    Past Surgical History:  Procedure Laterality Date   ABDOMINAL HYSTERECTOMY  2000   part   ANTERIOR AND POSTERIOR REPAIR N/A 09/08/2016   Procedure: ANTERIOR (CYSTOCELE) AND POSTERIOR REPAIR (RECTOCELE)/Perineorrhaphy;  Surgeon: Gorge Ade, MD;  Location: [facility];  Service: Gynecology;  Laterality: N/A;  Requests . Repair of enterocele/posterior   COLONOSCOPY     ~12 yrs ago- normal per pt    COSMETIC SURGERY  1999   breast implants   dental implants     with sedation   HAGLAND'S DEFORMITY EXCISION Left 09/13/2013   Procedure: LEFT FOOT: EXCISION INTERDIGITAL MORTON'S NEUROMA SINGLE EACH;  Surgeon: LELON JONETTA Shari Mickey., MD;  Location: [facility];  Service: Orthopedics;  Laterality: Left;   KNEE ARTHROSCOPY  2013,2011   right/left    KYPHOPLASTY N/A 02/18/2022   Procedure: THORACIC NINE, THORACIC TEN KYPHOPLASTY;  Surgeon: Gillie Duncans, MD;  Location: [facility];  Service: Neurosurgery;  Laterality: N/A;  RM 19   SHOULDER ARTHROSCOPY     rt   TUBAL LIGATION     WISDOM TOOTH EXTRACTION     Social History   Social History Narrative   Right handed    Immunization History  Administered Date(s) Administered   Fluad Trivalent(High Dose 65+) 10/11/2022   Influenza,inj,Quad PF,6+ Mos 12/03/2015, 11/22/2017, 11/09/2018, 12/11/2019, 02/02/2021   Moderna Sars-Covid-2 Vaccination 08/13/2019, 08/19/2019, 09/16/2019   PNEUMOCOCCAL CONJUGATE-20 10/11/2022   Pneumococcal Polysaccharide-23 11/22/2017   Tdap 02/09/2009, 05/29/2019   Zoster Recombinant(Shingrix) 11/09/2018, 05/29/2019     Objective: Vital Signs: BP 106/61 (BP Location: Left Arm, Patient Position: Sitting, Cuff Size: Normal)   Pulse 74   Resp 14   Ht 5' 8 (1.727 m)   Wt 182 lb 3.2 oz (82.6  kg)   BMI 27.70 kg/m    Physical Exam Vitals and nursing note reviewed.  Constitutional:      Appearance: She is well-developed.  HENT:     Head: Normocephalic and atraumatic.  Eyes:     Conjunctiva/sclera: Conjunctivae normal.  Cardiovascular:     Rate and Rhythm: Normal rate and regular rhythm.     Heart sounds: Normal heart sounds.  Pulmonary:     Effort: Pulmonary effort is normal.     Breath sounds: Normal breath sounds.  Abdominal:     General: Bowel sounds are normal.     Palpations: Abdomen is soft.  Musculoskeletal:     Cervical back: Normal range of motion.  Lymphadenopathy:     Cervical: No cervical adenopathy.  Skin:    General: Skin is warm and dry.     Capillary Refill: Capillary refill takes less than 2 seconds.     Comments: Pustular psoriasis on plantar aspect of both feet and a small patch on palm of right hand.  Neurological:     Mental Status: She is alert and oriented to person, place, and time.  Psychiatric:        Behavior: Behavior  normal.      Musculoskeletal Exam: C-spine, thoracic spine, lumbar spine good range of motion.  No midline spinal tenderness.  No SI joint tenderness.  Shoulder joints, elbow joints, wrist joints, MCPs, PIPs, DIPs have good range of motion with no synovitis.  Complete fist formation bilaterally.  PIP and DIP thickening noted.  Hip joints have slightly limited range of motion with mild discomfort in the right hip.  Knee joints have good range of motion with no warmth or effusion.  Ankle joints have good range of motion with no tenderness or joint swelling.  No evidence of Achilles tendinitis or plantar fasciitis.  CDAI Exam: CDAI Score: -- Patient Global: --; Provider Global: -- Swollen: --; Tender: -- Joint Exam 09/14/2023   No joint exam has been documented for this visit   There is currently no information documented on the homunculus. Go to the Rheumatology activity and complete the homunculus joint exam.  Investigation: No additional findings.  Imaging: No results found.  Recent Labs: Lab Results  Component Value Date   WBC 6.7 06/20/2023   HGB 13.4 06/20/2023   PLT 364 06/20/2023   NA 138 09/11/2023   K 4.6 09/11/2023   CL 99 09/11/2023   CO2 22 09/11/2023   GLUCOSE 93 09/11/2023   BUN 13 09/11/2023   CREATININE 0.86 09/11/2023   BILITOT 0.4 06/20/2023   ALKPHOS 141 (H) 01/28/2021   AST 19 06/20/2023   ALT 22 06/20/2023   PROT 6.9 06/20/2023   ALBUMIN 4.5 01/28/2021   CALCIUM  9.2 09/11/2023   GFRAA 92 07/09/2020   QFTBGOLDPLUS NEGATIVE 08/17/2022    Speciality Comments: Otezla stopped 08/01/20 Tremfya  started 08/03/20 - stopped 08/10/21 Cosentyx  started 10/06/21- stopped 12/01/21; Skyrizi  started 12/30/21  Procedures:  No procedures performed Allergies: Patient has no known allergies.     Assessment / Plan:     Visit Diagnoses: Psoriatic arthropathy (HCC): She has no synovitis or dactylitis on examination today.  No SI joint tenderness upon palpation.  No  evidence of Achilles tendinitis or plantar fasciitis.  She has clinically been doing well on Tremfya  100 mg sq injections once every 8 weeks.  She has been tolerating Tremfya  without any side effects or injection site reactions.  She is due for the next dose of Tremfya  on 09/18/2023.  She was advised to notify us  if she develops any new or worsening symptoms.  She will follow-up in the office in 5 months or sooner if needed.  Pustular psoriasis of palms and soles - Hx of light therapy and topical agents.  Follow-up visit scheduled with dermatology in May 2025.  Patient uses calcipotriene ointment and clobetasol ointment as needed--refills were sent to the pharmacy today.  High risk medication use - Tremfya 100 mg subcutaneous injections every 8 weeks.Tremfya restarted on 06/21/2022.IR Cosentyx, Skyrizi, and Otezla.  D/c methotrexate due to side effects. CBC updated on 06/20/23.  Patient plans on returning for blood work next week-future order for CBC with diff placed today along with hepatic function panel and TB gold.  BMP updated on 09/11/23. Lipid panel updated on 09/11/23.  TB gold negative on 08/17/22.  Unfortunately the lab tech was not in the clinic today so the patient plans on returning for updated lab work next week.  - Plan: CBC with Differential/Platelet, QuantiFERON-TB Gold Plus, Hepatic function panel  Screening for tuberculosis - Future order for TB gold placed today--lab tech was not in the office today for her to have updated lab work performed. Plan: QuantiFERON-TB Gold Plus  Age-related osteoporosis without current pathological fracture: - DEXA 03/14/22: The BMD LFN 0.719 T-score of -2.3. T9 and T10 kyphoplasty on 02/18/2022. Evenity dose: 06/06/2022, 07/04/2022, 08/01/2022, 08/29/2022, 09/26/2022, 10/24/2022, 11/21/2022, 12/19/2022, 01/23/2023, 02/20/2023, 03/20/2023, 04/17/2023--last dose received.   She has transition to IV Reclast which was administered on 06/22/2023.  She tolerated Reclast without any side  effects. Plan to update DEXA Spring 2026.  History of kyphoplasty - Underwent T9 and T10 kyphoplasty on 02/18/2022  History of vertebral fracture - Underwent T9 and T10 kyphoplasty on 02/18/2022  History of vitamin D deficiency: She has been taking calcium and vitamin D supplement.  Primary osteoarthritis of both hands: She has PIP and DIP thickening consistent osteoarthritis of both hands.  No synovitis noted.  Complete fist formation bilaterally.  Primary osteoarthritis of both knees: She has good range of motion of both knee joints on examination.  No warmth or effusion noted.  Primary osteoarthritis of both hips: No groin pain.  Other medical conditions are listed as follows:   Paresthesia of both feet  Family history of rheumatoid arthritis  h/o Hypertriglyceridemia  Smoking hx- ( less 1 ppd * 15 yrs) - Quit Jan 17 2013  Vitamin D deficiency  Neuropathy    Orders: Orders Placed This Encounter  Procedures   CBC with Differential/Platelet   QuantiFERON-TB Gold Plus   Hepatic function panel   No orders of the defined types were placed in this encounter.    Follow-Up Instructions: Return in about 5 months (around 02/14/2024) for Psoriatic arthritis.   Waddell CHRISTELLA Craze, PA-C  Note - This record has been created using Dragon software.  Chart creation errors have been sought, but may not always  have been located. Such creation errors do not reflect on  the standard of medical care.

## 2023-09-01 ENCOUNTER — Telehealth: Payer: Self-pay

## 2023-09-01 NOTE — Telephone Encounter (Signed)
 Copied from CRM [id number]. Topic: Clinical - Request for Lab/Test Order >> Sep 01, 2023  9:17 AM Avram MATSU wrote: Reason for CRM: patient is requesting total lab work for appt on 10/1

## 2023-09-04 NOTE — Telephone Encounter (Signed)
 LVM with pt to let her know that the labs will be discussed when she comes in  on 09/06/23

## 2023-09-04 NOTE — Telephone Encounter (Signed)
 Her appointment in oct was moved up to two days from now so we can talk about her lab work at that time.  Theres not enough time to get it now and have it ready prior to Wednesday.

## 2023-09-06 ENCOUNTER — Encounter: Payer: Self-pay | Admitting: Family Medicine

## 2023-09-06 ENCOUNTER — Ambulatory Visit (INDEPENDENT_AMBULATORY_CARE_PROVIDER_SITE_OTHER): Admitting: Family Medicine

## 2023-09-06 VITALS — BP 116/76 | HR 112 | Ht 68.0 in | Wt 181.1 lb

## 2023-09-06 DIAGNOSIS — R635 Abnormal weight gain: Secondary | ICD-10-CM | POA: Diagnosis not present

## 2023-09-06 DIAGNOSIS — E782 Mixed hyperlipidemia: Secondary | ICD-10-CM | POA: Diagnosis not present

## 2023-09-06 DIAGNOSIS — B001 Herpesviral vesicular dermatitis: Secondary | ICD-10-CM

## 2023-09-06 DIAGNOSIS — L403 Pustulosis palmaris et plantaris: Secondary | ICD-10-CM

## 2023-09-06 DIAGNOSIS — R609 Edema, unspecified: Secondary | ICD-10-CM | POA: Diagnosis not present

## 2023-09-06 DIAGNOSIS — Z6827 Body mass index (BMI) 27.0-27.9, adult: Secondary | ICD-10-CM

## 2023-09-06 DIAGNOSIS — M810 Age-related osteoporosis without current pathological fracture: Secondary | ICD-10-CM

## 2023-09-06 MED ORDER — HYDROCHLOROTHIAZIDE 12.5 MG PO TABS: 12.5000 mg | ORAL_TABLET | Freq: Every day | ORAL | 1 refills | Status: AC

## 2023-09-06 MED ORDER — PHENTERMINE HCL 37.5 MG PO TABS: 37.5000 mg | ORAL_TABLET | Freq: Every day | ORAL | 2 refills | Status: DC

## 2023-09-06 MED ORDER — ACYCLOVIR 400 MG PO TABS: ORAL_TABLET | ORAL | 2 refills | Status: DC

## 2023-09-06 NOTE — Assessment & Plan Note (Signed)
 Plans to get hyaluronic acid knee injections soon w/ orthopedics.

## 2023-09-06 NOTE — Assessment & Plan Note (Signed)
 History of elevated LDL cholesterol > 200, also elevated in 2023. Did not tolerate a prior statin trial (atorvastatin 20mg ) well. Currently attempting lifestyle modifications. Counseled that at this level, medication is typically recommended to reduce risk of heart attack and stroke, and that diet alone may not be sufficient. - Check fasting lipid panel and basic metabolic panel. - Will message with results. - If LDL remains >190, plan to start Lipitor 10mg  daily. Prescription will be sent if needed.

## 2023-09-06 NOTE — Patient Instructions (Signed)
 It was nice to see you today,  We addressed the following topics today: -I have sent in your medications. - We will need to recheck your cholesterol levels and some other labs. - Once we get your cholesterol levels, I will let you know if we need to start a new cholesterol medication.  Have a great day,  Rolan Slain, MD

## 2023-09-06 NOTE — Assessment & Plan Note (Signed)
 Sending in refill of phentermine .

## 2023-09-06 NOTE — Assessment & Plan Note (Signed)
 Continue tremfya

## 2023-09-06 NOTE — Progress Notes (Signed)
 Established Patient Office Visit  Subjective   Patient ID: Ann Newton, female    DOB: 14-Feb-1955  Age: 68 y.o. MRN: [id number]  Chief Complaint  Patient presents with   Medical Management of Chronic Issues    HPI  Subjective - Follow-up for hyperlipidemia. Saw OB/GYN who noted high cholesterol and advised follow-up with PCP. Last labs from 2023 showed elevated LDL, slightly elevated potassium, and HbA1c of 5.7. Thyroid , kidney, liver function, and hemoglobin were normal. Reports trying to improve diet and exercise. Started phentermine  in March 2025 and finished last month. Weight has decreased from 193 lbs to under 180 lbs. - Reports psoriatic and osteoarthritis of the knees. Awaiting approval for gel injections. Had them a year ago with good results. Recent x-rays show no change, with some bone-on-bone changes on the outside of the knees. Prefers injections over knee replacement.  Medications: Tremfya  every 2 months for psoriasis, daily multivitamin, hydrochlorothiazide  PRN for fluid retention (takes approximately once every 3 months), acyclovir  PRN. Previously took phentermine  for weight loss.  PMH, PSH, FH, Social Hx: PMH: Hyperlipidemia, psoriasis, psoriatic arthritis, osteoarthritis of knees, occasional fluid retention. Prior trial of a statin (atorvastatin  20mg ) about four years ago, which was discontinued after one month due to not feeling well. FH: No family history of high cholesterol, heart attack, or stroke. Social Hx: Non-smoker.  ROS: Constitutional: Reports weight loss. Cardiovascular: Denies chest pain. No pertinent positives mentioned. MSK: Reports knee pain.    The ASCVD Risk score (Arnett DK, et al., 2019) failed to calculate for the following reasons:   The valid total cholesterol range is 130 to 320 mg/dL  Health Maintenance Due  Topic Date Due   COVID-19 Vaccine (4 - 2024-25 season) 09/18/2022   INFLUENZA VACCINE  08/18/2023      Objective:     BP  116/76   Pulse (!) 112   Ht 5' 8 (1.727 m)   Wt 181 lb 1.9 oz (82.2 kg)   SpO2 98%   BMI 27.54 kg/m    Physical Exam Gen: alert, oriented Pulm: no resp distress Psych: pleasant affect.     No results found for any visits on 09/06/23.      Assessment & Plan:   Mixed hyperlipidemia Assessment & Plan: History of elevated LDL cholesterol > 200, also elevated in 2023. Did not tolerate a prior statin trial (atorvastatin  20mg ) well. Currently attempting lifestyle modifications. Counseled that at this level, medication is typically recommended to reduce risk of heart attack and stroke, and that diet alone may not be sufficient. - Check fasting lipid panel and basic metabolic panel. - Will message with results. - If LDL remains >190, plan to start Lipitor 10mg  daily. Prescription will be sent if needed.   Fever blister -     Acyclovir ; Take 1 tablet po TID for 5 days. Begin taking at first sign of fever blister  Dispense: 30 tablet; Refill: 2  Edema, unspecified type -     hydroCHLOROthiazide ; Take 1 tablet (12.5 mg total) by mouth daily.  Dispense: 30 tablet; Refill: 1  Weight gain -     Phentermine  HCl; Take 1 tablet (37.5 mg total) by mouth daily before breakfast.  Dispense: 30 tablet; Refill: 2  Pustular psoriasis of palms and soles Assessment & Plan: Continue tremfya    Age-related osteoporosis without current pathological fracture Assessment & Plan: Plans to get hyaluronic acid knee injections soon w/ orthopedics.    BMI 27.0-27.9,adult Assessment & Plan: Sending in refill of phentermine .  Return in about 3 months (around 12/07/2023) for hld.    Toribio MARLA Slain, MD

## 2023-09-08 ENCOUNTER — Other Ambulatory Visit: Payer: Self-pay | Admitting: *Deleted

## 2023-09-08 DIAGNOSIS — E782 Mixed hyperlipidemia: Secondary | ICD-10-CM

## 2023-09-11 ENCOUNTER — Other Ambulatory Visit

## 2023-09-11 DIAGNOSIS — E782 Mixed hyperlipidemia: Secondary | ICD-10-CM

## 2023-09-12 ENCOUNTER — Ambulatory Visit: Payer: Self-pay | Admitting: Family Medicine

## 2023-09-12 DIAGNOSIS — E782 Mixed hyperlipidemia: Secondary | ICD-10-CM

## 2023-09-12 LAB — BASIC METABOLIC PANEL WITH GFR
BUN/Creatinine Ratio: 15 (ref 12–28)
BUN: 13 mg/dL (ref 8–27)
CO2: 22 mmol/L (ref 20–29)
Calcium: 9.2 mg/dL (ref 8.7–10.3)
Chloride: 99 mmol/L (ref 96–106)
Creatinine, Ser: 0.86 mg/dL (ref 0.57–1.00)
Glucose: 93 mg/dL (ref 70–99)
Potassium: 4.6 mmol/L (ref 3.5–5.2)
Sodium: 138 mmol/L (ref 134–144)
eGFR: 74 mL/min/1.73 (ref >59)

## 2023-09-12 LAB — LIPID PANEL
Chol/HDL Ratio: 6.3 ratio — ABNORMAL HIGH (ref 0.0–4.4)
Cholesterol, Total: 316 mg/dL — ABNORMAL HIGH (ref 100–199)
HDL: 50 mg/dL (ref >39)
LDL Chol Calc (NIH): 217 mg/dL — ABNORMAL HIGH (ref 0–99)
Triglycerides: 246 mg/dL — ABNORMAL HIGH (ref 0–149)
VLDL Cholesterol Cal: 49 mg/dL — ABNORMAL HIGH (ref 5–40)

## 2023-09-12 MED ORDER — ATORVASTATIN CALCIUM 10 MG PO TABS: 10.0000 mg | ORAL_TABLET | Freq: Every day | ORAL | 1 refills | Status: DC

## 2023-09-13 ENCOUNTER — Ambulatory Visit: Admitting: Physician Assistant

## 2023-09-14 ENCOUNTER — Encounter: Payer: Self-pay | Admitting: Physician Assistant

## 2023-09-14 ENCOUNTER — Ambulatory Visit: Attending: Physician Assistant | Admitting: Physician Assistant

## 2023-09-14 VITALS — BP 106/61 | HR 74 | Resp 14 | Ht 68.0 in | Wt 182.2 lb

## 2023-09-14 DIAGNOSIS — M19042 Primary osteoarthritis, left hand: Secondary | ICD-10-CM

## 2023-09-14 DIAGNOSIS — Z8781 Personal history of (healed) traumatic fracture: Secondary | ICD-10-CM

## 2023-09-14 DIAGNOSIS — M810 Age-related osteoporosis without current pathological fracture: Secondary | ICD-10-CM | POA: Diagnosis not present

## 2023-09-14 DIAGNOSIS — L4050 Arthropathic psoriasis, unspecified: Secondary | ICD-10-CM | POA: Diagnosis not present

## 2023-09-14 DIAGNOSIS — G629 Polyneuropathy, unspecified: Secondary | ICD-10-CM

## 2023-09-14 DIAGNOSIS — Z87891 Personal history of nicotine dependence: Secondary | ICD-10-CM

## 2023-09-14 DIAGNOSIS — L403 Pustulosis palmaris et plantaris: Secondary | ICD-10-CM | POA: Diagnosis not present

## 2023-09-14 DIAGNOSIS — M19041 Primary osteoarthritis, right hand: Secondary | ICD-10-CM

## 2023-09-14 DIAGNOSIS — Z111 Encounter for screening for respiratory tuberculosis: Secondary | ICD-10-CM

## 2023-09-14 DIAGNOSIS — Z8639 Personal history of other endocrine, nutritional and metabolic disease: Secondary | ICD-10-CM

## 2023-09-14 DIAGNOSIS — Z79899 Other long term (current) drug therapy: Secondary | ICD-10-CM | POA: Diagnosis not present

## 2023-09-14 DIAGNOSIS — E559 Vitamin D deficiency, unspecified: Secondary | ICD-10-CM

## 2023-09-14 DIAGNOSIS — Z8261 Family history of arthritis: Secondary | ICD-10-CM

## 2023-09-14 DIAGNOSIS — R202 Paresthesia of skin: Secondary | ICD-10-CM

## 2023-09-14 DIAGNOSIS — M170 Bilateral primary osteoarthritis of knee: Secondary | ICD-10-CM

## 2023-09-14 DIAGNOSIS — Z9889 Other specified postprocedural states: Secondary | ICD-10-CM

## 2023-09-14 DIAGNOSIS — M160 Bilateral primary osteoarthritis of hip: Secondary | ICD-10-CM

## 2023-09-14 DIAGNOSIS — E781 Pure hyperglyceridemia: Secondary | ICD-10-CM

## 2023-09-14 MED ORDER — CLOBETASOL PROPIONATE 0.05 % EX OINT: TOPICAL_OINTMENT | CUTANEOUS | 2 refills | Status: DC

## 2023-09-14 MED ORDER — CALCIPOTRIENE 0.005 % EX OINT: TOPICAL_OINTMENT | CUTANEOUS | 2 refills | Status: AC

## 2023-09-14 NOTE — Patient Instructions (Signed)
 Standing Labs We placed an order today for your standing lab work.   Please have your standing labs drawn in 1 week   Please have your labs drawn 2 weeks prior to your appointment so that the provider can discuss your lab results at your appointment, if possible.  Please note that you may see your imaging and lab results in MyChart before we have reviewed them. We will contact you once all results are reviewed. Please allow our office up to 72 hours to thoroughly review all of the results before contacting the office for clarification of your results.  WALK-IN LAB HOURS  Monday through Thursday from 8:00 am -12:30 pm and 1:00 pm-4:30 pm and Friday from 8:00 am-12:00 pm.  Patients with office visits requiring labs will be seen before walk-in labs.  You may encounter longer than normal wait times. Please allow additional time. Wait times may be shorter on  Monday and Thursday afternoons.  We do not book appointments for walk-in labs. We appreciate your patience and understanding with our staff.   Labs are drawn by [facility]. Please bring your co-pay at the time of your lab draw.  You may receive a bill from [facility] for your lab work.  Please note if you are on Hydroxychloroquine and and an order has been placed for a Hydroxychloroquine level,  you will need to have it drawn 4 hours or more after your last dose.  If you wish to have your labs drawn at another location, please call the office 24 hours in advance so we can fax the orders.  The office is located at [address], [place], [place] [address]   If you have any questions regarding directions or hours of operation,  please call [phone].   As a reminder, please drink plenty of water  prior to coming for your lab work. Thanks!

## 2023-09-20 ENCOUNTER — Other Ambulatory Visit: Payer: Self-pay | Admitting: *Deleted

## 2023-09-20 DIAGNOSIS — Z111 Encounter for screening for respiratory tuberculosis: Secondary | ICD-10-CM

## 2023-09-20 DIAGNOSIS — Z79899 Other long term (current) drug therapy: Secondary | ICD-10-CM

## 2023-09-21 ENCOUNTER — Ambulatory Visit: Payer: Self-pay | Admitting: Physician Assistant

## 2023-09-21 NOTE — Progress Notes (Signed)
 CBC WNL. Hepatic function panel WNL

## 2023-09-22 LAB — CBC WITH DIFFERENTIAL/PLATELET
Absolute Lymphocytes: 2428 {cells}/uL (ref 850–3900)
Absolute Monocytes: 510 {cells}/uL (ref 200–950)
Basophils Absolute: 41 {cells}/uL (ref 0–200)
Basophils Relative: 0.6 %
Eosinophils Absolute: 211 {cells}/uL (ref 15–500)
Eosinophils Relative: 3.1 %
HCT: 41.4 % (ref 35.0–45.0)
Hemoglobin: 13.6 g/dL (ref 11.7–15.5)
MCH: 30.7 pg (ref 27.0–33.0)
MCHC: 32.9 g/dL (ref 32.0–36.0)
MCV: 93.5 fL (ref 80.0–100.0)
MPV: 9.9 fL (ref 7.5–12.5)
Monocytes Relative: 7.5 %
Neutro Abs: 3611 {cells}/uL (ref 1500–7800)
Neutrophils Relative %: 53.1 %
Platelets: 370 Thousand/uL (ref 140–400)
RBC: 4.43 Million/uL (ref 3.80–5.10)
RDW: 12.9 % (ref 11.0–15.0)
Total Lymphocyte: 35.7 %
WBC: 6.8 Thousand/uL (ref 3.8–10.8)

## 2023-09-22 LAB — HEPATIC FUNCTION PANEL
AG Ratio: 1.6 (calc) (ref 1.0–2.5)
ALT: 19 U/L (ref 6–29)
AST: 19 U/L (ref 10–35)
Albumin: 4.2 g/dL (ref 3.6–5.1)
Alkaline phosphatase (APISO): 88 U/L (ref 37–153)
Bilirubin, Direct: 0.1 mg/dL (ref 0.0–0.2)
Globulin: 2.6 g/dL (ref 1.9–3.7)
Indirect Bilirubin: 0.3 mg/dL (ref 0.2–1.2)
Total Bilirubin: 0.4 mg/dL (ref 0.2–1.2)
Total Protein: 6.8 g/dL (ref 6.1–8.1)

## 2023-09-22 LAB — QUANTIFERON-TB GOLD PLUS
Mitogen-NIL: 6.72 [IU]/mL
NIL: 0.03 [IU]/mL
QuantiFERON-TB Gold Plus: NEGATIVE
TB1-NIL: <0 [IU]/mL
TB2-NIL: 0 [IU]/mL

## 2023-09-22 NOTE — Progress Notes (Signed)
 TB gold negative

## 2023-10-06 ENCOUNTER — Other Ambulatory Visit: Payer: Self-pay | Admitting: Physician Assistant

## 2023-10-06 DIAGNOSIS — L403 Pustulosis palmaris et plantaris: Secondary | ICD-10-CM

## 2023-10-06 DIAGNOSIS — Z79899 Other long term (current) drug therapy: Secondary | ICD-10-CM

## 2023-10-06 DIAGNOSIS — L4050 Arthropathic psoriasis, unspecified: Secondary | ICD-10-CM

## 2023-10-06 NOTE — Telephone Encounter (Signed)
 Last Fill: 08/09/2023  Labs: 09/20/2023 CBC WNL Hepatic function panel WNL  TB Gold: 09/20/2023 TB Gold Negative    Next Visit: 02/15/2024  Last Visit: 09/14/2023  IK:Ednmpjupr arthropathy   Current Dose per office note 09/14/2023: Tremfya  100 mg subcutaneous injections every 8 weeks.   Okay to refill Tremfyia?

## 2023-10-18 ENCOUNTER — Ambulatory Visit: Admitting: Family Medicine

## 2023-10-18 ENCOUNTER — Telehealth: Payer: Self-pay | Admitting: Pharmacist

## 2023-10-18 NOTE — Telephone Encounter (Signed)
 Submitted a Prior Authorization RENEWAL request to [employer] for TREMFYA  via CoverMyMeds. Will update once we receive a response.  Key: [id number]

## 2023-10-18 NOTE — Telephone Encounter (Signed)
 Received notification from [employer] regarding a prior authorization for TREMFYA . Authorization has been APPROVED from 10/18/2023 to 10/17/2024. Approval letter sent to scan center.  Authorization # [id number]  Sherry Pennant, PharmD, MPH, BCPS, CPP Clinical Pharmacist [facility])

## 2023-12-07 ENCOUNTER — Ambulatory Visit: Admitting: Family Medicine

## 2023-12-18 LAB — HM MAMMOGRAPHY

## 2023-12-27 ENCOUNTER — Ambulatory Visit: Admitting: Family Medicine

## 2024-01-03 ENCOUNTER — Other Ambulatory Visit: Payer: Self-pay | Admitting: Physician Assistant

## 2024-01-03 NOTE — Telephone Encounter (Signed)
 Last Fill: 09/14/2023  Next Visit: 02/15/2024  Last Visit: 09/14/2023  Dx: Pustular psoriasis of palms and soles   Current Dose per office note on 09/14/2023: clobetasol  ointment as needed   Okay to refill Clobetasol  Cream?

## 2024-01-18 ENCOUNTER — Other Ambulatory Visit: Payer: Self-pay | Admitting: Physician Assistant

## 2024-01-18 DIAGNOSIS — Z79899 Other long term (current) drug therapy: Secondary | ICD-10-CM

## 2024-01-19 NOTE — Telephone Encounter (Signed)
 Last Fill: 10/06/2023  Labs: 09/20/2023 CBC CBC WNL   09/11/2023 BMP normal  TB Gold: 09/20/2023  TB gold negative   Next Visit: 02/15/2024  Last Visit: 09/14/2023  DX: Psoriatic arthropathy (HCC)   Current Dose per office note 09/14/2023: Tremfya  100 mg subcutaneous injections every 8 weeks.   Patient to update labs at upcomming appointment.   Okay to refill Tremfyia?

## 2024-01-23 ENCOUNTER — Encounter: Payer: Self-pay | Admitting: Family Medicine

## 2024-01-23 ENCOUNTER — Ambulatory Visit: Admitting: Family Medicine

## 2024-01-23 VITALS — BP 121/75 | HR 91 | Ht 68.0 in | Wt 191.8 lb

## 2024-01-23 DIAGNOSIS — E7800 Pure hypercholesterolemia, unspecified: Secondary | ICD-10-CM

## 2024-01-23 DIAGNOSIS — R635 Abnormal weight gain: Secondary | ICD-10-CM

## 2024-01-23 DIAGNOSIS — Z6827 Body mass index (BMI) 27.0-27.9, adult: Secondary | ICD-10-CM

## 2024-01-23 DIAGNOSIS — Z6829 Body mass index (BMI) 29.0-29.9, adult: Secondary | ICD-10-CM | POA: Diagnosis not present

## 2024-01-23 DIAGNOSIS — L403 Pustulosis palmaris et plantaris: Secondary | ICD-10-CM

## 2024-01-23 DIAGNOSIS — E782 Mixed hyperlipidemia: Secondary | ICD-10-CM

## 2024-01-23 DIAGNOSIS — Z23 Encounter for immunization: Secondary | ICD-10-CM | POA: Diagnosis not present

## 2024-01-23 DIAGNOSIS — B001 Herpesviral vesicular dermatitis: Secondary | ICD-10-CM | POA: Diagnosis not present

## 2024-01-23 MED ORDER — ACYCLOVIR 400 MG PO TABS: ORAL_TABLET | ORAL | 2 refills | Status: AC

## 2024-01-23 MED ORDER — PHENTERMINE HCL 37.5 MG PO TABS: 37.5000 mg | ORAL_TABLET | Freq: Every day | ORAL | 2 refills | Status: AC

## 2024-01-23 NOTE — Progress Notes (Unsigned)
 "  Established Patient Office Visit  Subjective   Patient ID: Ann Newton, female    DOB: January 17, 1956  Age: 69 y.o. MRN: [id number]  Chief Complaint  Patient presents with   Medical Management of Chronic Issues     History of Present Illness   Ann Newton is a 69 year old female with hyperlipidemia and obesity who presents for follow-up on cholesterol management and weight concerns.  She did not manage her weight well over the holidays and is seeking to get back on track. She previously used phentermine  for three months, resulting in an 18-pound weight loss that she was able to maintain. However, she has not used it since November and is interested in continuing phentermine  as it was effective for her.  Regarding her cholesterol, she is currently taking a daily cholesterol medication, which she tolerates well. She recalls having issues with a higher dose in the past but is currently on a lower dose without any noticeable side effects.  She also mentions having psoriasis and psoriatic arthritis, for which she is taking Tremfya  every other month. The cold weather exacerbates her psoriasis symptoms, and her recent injection at Christmas did not clear her symptoms as quickly as before. She also uses clobetasol  for her skin condition.  She recently had a mammogram at [facility], which was normal. She is in the process of changing her OB/GYN as her previous one retired.          The ASCVD Risk score (Arnett DK, et al., 2019) failed to calculate for the following reasons:   According to ACC/AHA guidelines, patients with an LDL-C level greater than 190 mg/dL (5.08 mmol/L) should be considered for high-intensity statin therapy. This patient's most recent LDL-C level is 217 mg/dL.  Health Maintenance Due  Topic Date Due   COVID-19 Vaccine (4 - 2025-26 season) 09/18/2023   Mammogram  12/02/2023      Objective:     BP 121/75   Pulse 91   Ht 5' 8 (1.727 m)   Wt 191 lb 12.8 oz (87  kg)   SpO2 95%   BMI 29.16 kg/m  {Vitals History (Optional):23777}  Physical Exam     Gen: alert, oriented Pulm: no respiratory distress Psych: pleasant affect       No results found for any visits on 01/23/24.      Assessment & Plan:   Elevated LDL cholesterol level -     Comprehensive metabolic panel with GFR -     Lipid panel  Weight gain -     Phentermine  HCl; Take 1 tablet (37.5 mg total) by mouth daily before breakfast.  Dispense: 30 tablet; Refill: 2  Mixed hyperlipidemia Assessment & Plan: Managed with atorvastatin . Current dose well-tolerated. Plan to increase dose if necessary. - Recheck cholesterol levels at next visit. - If needed, prescribe two 10 mg tablets daily, instruct to take 15 mg daily for a couple of months before increasing to 20 mg daily.   Pustular psoriasis of palms and soles Assessment & Plan: Managed with Tremfya  injections. Recent injection less effective, possibly due to cold weather. Clobetasol  used topically. - Continue Tremfya  injections every other month. - Continue clobetasol  for topical management.   BMI 27.0-27.9,adult Assessment & Plan: Phentermine  effective with 18-pound weight loss over three months. GLP-1 injectables not covered by insurance. Topamax and Wellbutrin not preferred due to side effects and limited efficacy. - Resent prescription for phentermine . Continue as long as effective. - Advised to request refills as  needed, maximum three months supply.   Other orders -     Flu vaccine HIGH DOSE PF(Fluzone Trivalent)             Return in about 4 months (around 05/22/2024) for physical.    Toribio MARLA Slain, MD  "

## 2024-01-23 NOTE — Assessment & Plan Note (Signed)
 Managed with atorvastatin . Current dose well-tolerated. Plan to increase dose if necessary. - Recheck cholesterol levels at next visit. - If needed, prescribe two 10 mg tablets daily, instruct to take 15 mg daily for a couple of months before increasing to 20 mg daily.

## 2024-01-23 NOTE — Assessment & Plan Note (Signed)
 Managed with Tremfya  injections. Recent injection less effective, possibly due to cold weather. Clobetasol  used topically. - Continue Tremfya  injections every other month. - Continue clobetasol  for topical management.

## 2024-01-23 NOTE — Patient Instructions (Signed)
 It was nice to see you today,  We addressed the following topics today: - I have sent in your phentermine .  - I will let you know if we need to increase your atorvastatin .   Have a great day,  Rolan Slain, MD

## 2024-01-23 NOTE — Assessment & Plan Note (Signed)
 Phentermine  effective with 18-pound weight loss over three months. GLP-1 injectables not covered by insurance. Topamax and Wellbutrin not preferred due to side effects and limited efficacy. - Resent prescription for phentermine . Continue as long as effective. - Advised to request refills as needed, maximum three months supply.

## 2024-01-24 ENCOUNTER — Ambulatory Visit: Payer: Self-pay | Admitting: Family Medicine

## 2024-01-24 LAB — LIPID PANEL
Chol/HDL Ratio: 4.7 ratio — ABNORMAL HIGH (ref 0.0–4.4)
Cholesterol, Total: 246 mg/dL — ABNORMAL HIGH (ref 100–199)
HDL: 52 mg/dL
LDL Chol Calc (NIH): 161 mg/dL — ABNORMAL HIGH (ref 0–99)
Triglycerides: 181 mg/dL — ABNORMAL HIGH (ref 0–149)
VLDL Cholesterol Cal: 33 mg/dL (ref 5–40)

## 2024-01-24 LAB — COMPREHENSIVE METABOLIC PANEL WITH GFR
ALT: 27 IU/L (ref 0–32)
AST: 23 IU/L (ref 0–40)
Albumin: 4.3 g/dL (ref 3.9–4.9)
Alkaline Phosphatase: 107 IU/L (ref 49–135)
BUN/Creatinine Ratio: 21 (ref 12–28)
BUN: 15 mg/dL (ref 8–27)
Bilirubin Total: 0.3 mg/dL (ref 0.0–1.2)
CO2: 25 mmol/L (ref 20–29)
Calcium: 9.2 mg/dL (ref 8.7–10.3)
Chloride: 102 mmol/L (ref 96–106)
Creatinine, Ser: 0.73 mg/dL (ref 0.57–1.00)
Globulin, Total: 2.2 g/dL (ref 1.5–4.5)
Glucose: 95 mg/dL (ref 70–99)
Potassium: 5.6 mmol/L — ABNORMAL HIGH (ref 3.5–5.2)
Sodium: 141 mmol/L (ref 134–144)
Total Protein: 6.5 g/dL (ref 6.0–8.5)
eGFR: 90 mL/min/1.73

## 2024-01-24 MED ORDER — ATORVASTATIN CALCIUM 10 MG PO TABS: ORAL_TABLET | ORAL | 0 refills | Status: AC

## 2024-01-24 MED ORDER — ATORVASTATIN CALCIUM 20 MG PO TABS: 20.0000 mg | ORAL_TABLET | Freq: Every day | ORAL | 3 refills | Status: AC

## 2024-02-01 NOTE — Progress Notes (Unsigned)
 "  Office Visit Note  Patient: Ann Newton             Date of Birth: [birth year]           MRN: [id number]             PCP: Chandra Toribio POUR, MD Referring: Chandra Toribio POUR, MD Visit Date: 02/15/2024 Occupation: Data Unavailable  Subjective:    History of Present Illness: Ann Newton is a 69 y.o. female with history of psoriatic arthritis.  Patient remains on Tremfya  100 mg subcutaneous injections every 8 weeks.   Cmp updated on 01/23/24 CBC updated on 09/20/23.  TB gold negative on 09/20/23.  Discussed the importance of holding tremfya  if she develops signs or symptoms of an infection and to resume once the infection has completely cleared.       Activities of Daily Living:  Patient reports morning stiffness for *** {minute/hour:19697}.   Patient {ACTIONS;DENIES/REPORTS:21021675::Denies} nocturnal pain.  Difficulty dressing/grooming: {ACTIONS;DENIES/REPORTS:21021675::Denies} Difficulty climbing stairs: {ACTIONS;DENIES/REPORTS:21021675::Denies} Difficulty getting out of chair: {ACTIONS;DENIES/REPORTS:21021675::Denies} Difficulty using hands for taps, buttons, cutlery, and/or writing: {ACTIONS;DENIES/REPORTS:21021675::Denies}  No Rheumatology ROS completed.   PMFS History:  Patient Active Problem List   Diagnosis Date Noted   Abnormal weight gain 07/06/2022   Age-related osteoporosis without current pathological fracture 05/06/2022   History of kyphoplasty 05/06/2022   History of vertebral fracture 05/06/2022   Atopic dermatitis 07/26/2021   Elevated blood pressure reading 05/02/2021   Acute non-recurrent pansinusitis 04/07/2021   Weight loss 02/25/2021   Fever blister 02/04/2021   Edema 02/04/2021   Thyroid  nodule greater than or equal to 1 cm in diameter incidentally noted on imaging study 11/20/2018   Fatigue 11/09/2018   Elevated glucose 11/09/2018   Snores 11/09/2018   Hair loss 11/09/2018   Need for influenza vaccination 11/09/2018   Thyromegaly  11/09/2018   Allergic reaction 05/17/2018   Maculopapular rash 05/17/2018   Family history of rheumatoid arthritis 03/09/2018   History of vitamin D  deficiency 03/09/2018   Primary osteoarthritis of both knees 02/22/2018   Primary osteoarthritis of both hands 02/22/2018   Chronic pain of both knees 11/22/2017   Vitamin D  deficiency 01/24/2016   Smoking hx- ( less 1 ppd * 15 yrs) - Quit Jan 17 2013 12/28/2015   Elevated LDL cholesterol level 12/28/2015   Elevated vitamin B12 level 12/28/2015   h/o Mild peripheral edema- takes HCTZ for this 12/20/2015   Dietary B12 deficiency 12/20/2015   History of chickenpox ( Aug '17 )  12/20/2015   Healthcare maintenance 12/20/2015   BMI 27.0-27.9,adult 12/03/2015   h/o Hypertriglyceridemia 12/03/2015   Mixed hyperlipidemia 12/03/2015   Pustular psoriasis of palms and soles 10/28/2010    Past Medical History:  Diagnosis Date   Arthritis    Atopic dermatitis    Complication of anesthesia    woke up early during colonoscopy and dental implant   Fever blister    Hyperlipidemia    diet controlled   Need for shingles vaccine 11/22/2017   Neuropathy 11/22/2017   Psoriasis    Psoriatic arthritis (HCC)    Thyroid  disease    as a child, no problems as an adult   Wears contact lenses     Family History  Problem Relation Age of Onset   Kidney disease Mother    Diabetes Mother    Heart attack Father    Rheum arthritis Brother    Rheum arthritis Brother    Heart attack Maternal Grandmother    Heart  attack Maternal Grandfather    Cancer Paternal Grandmother    Heart attack Paternal Grandfather    Healthy Daughter    Healthy Son    Colon cancer Neg Hx    Colon polyps Neg Hx    Esophageal cancer Neg Hx    Rectal cancer Neg Hx    Stomach cancer Neg Hx    Past Surgical History:  Procedure Laterality Date   ABDOMINAL HYSTERECTOMY  2000   part   ANTERIOR AND POSTERIOR REPAIR N/A 09/08/2016   Procedure: ANTERIOR (CYSTOCELE) AND POSTERIOR  REPAIR (RECTOCELE)/Perineorrhaphy;  Surgeon: Gorge Ade, MD;  Location: [facility];  Service: Gynecology;  Laterality: N/A;  Requests . Repair of enterocele/posterior   COLONOSCOPY     ~12 yrs ago- normal per pt    COSMETIC SURGERY  1999   breast implants   dental implants     with sedation   HAGLAND'S DEFORMITY EXCISION Left 09/13/2013   Procedure: LEFT FOOT: EXCISION INTERDIGITAL MORTON'S NEUROMA SINGLE EACH;  Surgeon: LELON JONETTA Shari Mickey., MD;  Location: [facility];  Service: Orthopedics;  Laterality: Left;   KNEE ARTHROSCOPY  2013,2011   right/left   KYPHOPLASTY N/A 02/18/2022   Procedure: THORACIC NINE, THORACIC TEN KYPHOPLASTY;  Surgeon: Gillie Duncans, MD;  Location: [facility];  Service: Neurosurgery;  Laterality: N/A;  RM 19   SHOULDER ARTHROSCOPY     rt   TUBAL LIGATION     WISDOM TOOTH EXTRACTION     Social History[1] Social History   Social History Narrative   Right handed      Immunization History  Administered Date(s) Administered   Fluad Trivalent(High Dose 65+) 10/11/2022   INFLUENZA, HIGH DOSE SEASONAL PF 01/23/2024   Influenza,inj,Quad PF,6+ Mos 12/03/2015, 11/22/2017, 11/09/2018, 12/11/2019, 02/02/2021   Moderna Sars-Covid-2 Vaccination 08/13/2019, 08/19/2019, 09/16/2019   PNEUMOCOCCAL CONJUGATE-20 10/11/2022   Pneumococcal Polysaccharide-23 11/22/2017   Tdap 02/09/2009, 05/29/2019   Zoster Recombinant(Shingrix ) 11/09/2018, 05/29/2019     Objective: Vital Signs: There were no vitals taken for this visit.   Physical Exam Vitals and nursing note reviewed.  Constitutional:      Appearance: She is well-developed.  HENT:     Head: Normocephalic and atraumatic.  Eyes:     Conjunctiva/sclera: Conjunctivae normal.  Cardiovascular:     Rate and Rhythm: Normal rate and regular rhythm.     Heart sounds: Normal heart sounds.  Pulmonary:     Effort: Pulmonary effort is normal.     Breath sounds: Normal breath sounds.  Abdominal:     General: Bowel  sounds are normal.     Palpations: Abdomen is soft.  Musculoskeletal:     Cervical back: Normal range of motion.  Lymphadenopathy:     Cervical: No cervical adenopathy.  Skin:    General: Skin is warm and dry.     Capillary Refill: Capillary refill takes less than 2 seconds.  Neurological:     Mental Status: She is alert and oriented to person, place, and time.  Psychiatric:        Behavior: Behavior normal.      Musculoskeletal Exam: ***  CDAI Exam: CDAI Score: -- Patient Global: --; Provider Global: -- Swollen: --; Tender: -- Joint Exam 02/15/2024   No joint exam has been documented for this visit   There is currently no information documented on the homunculus. Go to the Rheumatology activity and complete the homunculus joint exam.  Investigation: No additional findings.  Imaging: No results found.  Recent Labs: Lab Results  Component Value Date   WBC 6.8 09/20/2023   HGB 13.6 09/20/2023   PLT 370 09/20/2023   NA 141 01/23/2024   K 5.6 (H) 01/23/2024   CL 102 01/23/2024   CO2 25 01/23/2024   GLUCOSE 95 01/23/2024   BUN 15 01/23/2024   CREATININE 0.73 01/23/2024   BILITOT 0.3 01/23/2024   ALKPHOS 107 01/23/2024   AST 23 01/23/2024   ALT 27 01/23/2024   PROT 6.5 01/23/2024   ALBUMIN 4.3 01/23/2024   CALCIUM  9.2 01/23/2024   GFRAA 92 07/09/2020   QFTBGOLDPLUS NEGATIVE 09/20/2023    Speciality Comments: Otezla stopped 08/01/20 Tremfya  started 08/03/20 - stopped 08/10/21 Cosentyx  started 10/06/21- stopped 12/01/21; Skyrizi  started 12/30/21  Procedures:  No procedures performed Allergies: Patient has no known allergies.   Assessment / Plan:     Visit Diagnoses: Psoriatic arthropathy (HCC)  High risk medication use  Pustular psoriasis of palms and soles  Age-related osteoporosis without current pathological fracture  History of kyphoplasty  History of vertebral fracture  History of vitamin D  deficiency  Primary osteoarthritis of both  hands  Primary osteoarthritis of both knees  Primary osteoarthritis of both hips  Paresthesia of both feet  Family history of rheumatoid arthritis  h/o Hypertriglyceridemia  Smoking hx- ( less 1 ppd * 15 yrs) - Quit Jan 17 2013  Vitamin D  deficiency  Neuropathy  Orders: No orders of the defined types were placed in this encounter.  No orders of the defined types were placed in this encounter.   Face-to-face time spent with patient was *** minutes. Greater than 50% of time was spent in counseling and coordination of care.  Follow-Up Instructions: No follow-ups on file.   Waddell CHRISTELLA Craze, PA-C  Note - This record has been created using Dragon software.  Chart creation errors have been sought, but may not always  have been located. Such creation errors do not reflect on  the standard of medical care.     [1]  Social History Tobacco Use   Smoking status: Former    Current packs/day: 0.00    Average packs/day: 0.5 packs/day for 10.0 years (5.0 ttl pk-yrs)    Types: Cigarettes    Start date: 09/13/2002    Quit date: 09/12/2012    Years since quitting: 11.3    Passive exposure: Current   Smokeless tobacco: Never  Vaping Use   Vaping status: Never Used  Substance Use Topics   Alcohol use: No   Drug use: No   "

## 2024-02-15 ENCOUNTER — Ambulatory Visit: Admitting: Physician Assistant

## 2024-02-15 DIAGNOSIS — E781 Pure hyperglyceridemia: Secondary | ICD-10-CM

## 2024-02-15 DIAGNOSIS — M19042 Primary osteoarthritis, left hand: Secondary | ICD-10-CM

## 2024-02-15 DIAGNOSIS — R202 Paresthesia of skin: Secondary | ICD-10-CM

## 2024-02-15 DIAGNOSIS — L4050 Arthropathic psoriasis, unspecified: Secondary | ICD-10-CM

## 2024-02-15 DIAGNOSIS — Z8639 Personal history of other endocrine, nutritional and metabolic disease: Secondary | ICD-10-CM

## 2024-02-15 DIAGNOSIS — M170 Bilateral primary osteoarthritis of knee: Secondary | ICD-10-CM

## 2024-02-15 DIAGNOSIS — G629 Polyneuropathy, unspecified: Secondary | ICD-10-CM

## 2024-02-15 DIAGNOSIS — Z87891 Personal history of nicotine dependence: Secondary | ICD-10-CM

## 2024-02-15 DIAGNOSIS — M160 Bilateral primary osteoarthritis of hip: Secondary | ICD-10-CM

## 2024-02-15 DIAGNOSIS — M810 Age-related osteoporosis without current pathological fracture: Secondary | ICD-10-CM

## 2024-02-15 DIAGNOSIS — Z79899 Other long term (current) drug therapy: Secondary | ICD-10-CM

## 2024-02-15 DIAGNOSIS — L403 Pustulosis palmaris et plantaris: Secondary | ICD-10-CM

## 2024-02-15 DIAGNOSIS — Z8261 Family history of arthritis: Secondary | ICD-10-CM

## 2024-02-15 DIAGNOSIS — Z9889 Other specified postprocedural states: Secondary | ICD-10-CM

## 2024-02-15 DIAGNOSIS — Z8781 Personal history of (healed) traumatic fracture: Secondary | ICD-10-CM

## 2024-02-15 DIAGNOSIS — E559 Vitamin D deficiency, unspecified: Secondary | ICD-10-CM

## 2024-02-21 ENCOUNTER — Ambulatory Visit: Admitting: Physician Assistant

## 2024-02-21 ENCOUNTER — Telehealth: Payer: Self-pay

## 2024-02-21 ENCOUNTER — Encounter: Payer: Self-pay | Admitting: Physician Assistant

## 2024-02-21 ENCOUNTER — Other Ambulatory Visit: Payer: Self-pay

## 2024-02-21 VITALS — BP 130/74 | HR 92 | Temp 97.6°F | Resp 15 | Ht 68.0 in | Wt 190.2 lb

## 2024-02-21 DIAGNOSIS — M19041 Primary osteoarthritis, right hand: Secondary | ICD-10-CM | POA: Diagnosis not present

## 2024-02-21 DIAGNOSIS — Z9889 Other specified postprocedural states: Secondary | ICD-10-CM

## 2024-02-21 DIAGNOSIS — M19042 Primary osteoarthritis, left hand: Secondary | ICD-10-CM

## 2024-02-21 DIAGNOSIS — R202 Paresthesia of skin: Secondary | ICD-10-CM | POA: Diagnosis not present

## 2024-02-21 DIAGNOSIS — M160 Bilateral primary osteoarthritis of hip: Secondary | ICD-10-CM | POA: Diagnosis not present

## 2024-02-21 DIAGNOSIS — L4050 Arthropathic psoriasis, unspecified: Secondary | ICD-10-CM | POA: Diagnosis not present

## 2024-02-21 DIAGNOSIS — Z8639 Personal history of other endocrine, nutritional and metabolic disease: Secondary | ICD-10-CM

## 2024-02-21 DIAGNOSIS — Z87891 Personal history of nicotine dependence: Secondary | ICD-10-CM

## 2024-02-21 DIAGNOSIS — Z79899 Other long term (current) drug therapy: Secondary | ICD-10-CM | POA: Diagnosis not present

## 2024-02-21 DIAGNOSIS — M810 Age-related osteoporosis without current pathological fracture: Secondary | ICD-10-CM | POA: Diagnosis not present

## 2024-02-21 DIAGNOSIS — E781 Pure hyperglyceridemia: Secondary | ICD-10-CM

## 2024-02-21 DIAGNOSIS — Z8261 Family history of arthritis: Secondary | ICD-10-CM

## 2024-02-21 DIAGNOSIS — M170 Bilateral primary osteoarthritis of knee: Secondary | ICD-10-CM | POA: Diagnosis not present

## 2024-02-21 DIAGNOSIS — L403 Pustulosis palmaris et plantaris: Secondary | ICD-10-CM | POA: Diagnosis not present

## 2024-02-21 DIAGNOSIS — E559 Vitamin D deficiency, unspecified: Secondary | ICD-10-CM

## 2024-02-21 DIAGNOSIS — G629 Polyneuropathy, unspecified: Secondary | ICD-10-CM

## 2024-02-21 DIAGNOSIS — Z8781 Personal history of (healed) traumatic fracture: Secondary | ICD-10-CM | POA: Diagnosis not present

## 2024-02-21 LAB — CBC WITH DIFFERENTIAL/PLATELET
Absolute Lymphocytes: 2784 {cells}/uL (ref 850–3900)
Absolute Monocytes: 657 {cells}/uL (ref 200–950)
Basophils Absolute: 31 {cells}/uL (ref 0–200)
Basophils Relative: 0.5 %
Eosinophils Absolute: 161 {cells}/uL (ref 15–500)
Eosinophils Relative: 2.6 %
HCT: 39 % (ref 35.9–46.0)
Hemoglobin: 12.8 g/dL (ref 11.7–15.5)
MCH: 30.4 pg (ref 27.0–33.0)
MCHC: 32.8 g/dL (ref 31.6–35.4)
MCV: 92.6 fL (ref 81.4–101.7)
MPV: 10.8 fL (ref 7.5–12.5)
Monocytes Relative: 10.6 %
Neutro Abs: 2567 {cells}/uL (ref 1500–7800)
Neutrophils Relative %: 41.4 %
Platelets: 373 10*3/uL (ref 140–400)
RBC: 4.21 Million/uL (ref 3.80–5.10)
RDW: 12.4 % (ref 11.0–15.0)
Total Lymphocyte: 44.9 %
WBC: 6.2 10*3/uL (ref 3.8–10.8)

## 2024-02-21 MED ORDER — VTAMA 1 % EX CREA: TOPICAL_CREAM | CUTANEOUS | 0 refills | Status: AC

## 2024-02-21 NOTE — Telephone Encounter (Signed)
 Please review and sign. Used directions on previous prescription. Please change grams or refills as needed.

## 2024-02-21 NOTE — Telephone Encounter (Signed)
-----   Message from Waddell CHRISTELLA Craze sent at 02/21/2024  1:03 PM EST ----- Please send in vtama  cream

## 2024-02-21 NOTE — Telephone Encounter (Signed)
 Submitted a Prior Authorization request to [facility] for Vtama  Cream via CoverMyMeds. Will update once we receive a response.

## 2024-02-21 NOTE — Patient Instructions (Signed)
 Apremilast Tablets What is this medication? APREMILAST (a PRE mil ast) treats autoimmune conditions, such as arthritis and psoriasis. It may also be used to treat mouth ulcers in people with a condition that causes blood vessel swelling (Behcet syndrome). It works by decreasing inflammation. This medicine may be used for other purposes; ask your health care provider or pharmacist if you have questions. COMMON BRAND NAME(S): Otezla, Otezla XR What should I tell my care team before I take this medication? They need to know if you have any of these conditions: Dehydration Depression Kidney disease Suicidal thoughts, plans, or attempt An unusual or allergic reaction to apremilast, other medications, foods, dyes, or preservatives Pregnant or trying to get pregnant Breastfeeding How should I use this medication? Take this medication by mouth with water. Take it as directed on the prescription label at the same time every day. Do not cut, crush, or chew this medication. Swallow the tablets whole. You can take it with or without food. If it upsets your stomach, take it with food. Keep taking it unless your care team tells you to stop. Talk to your care team about the use of this medication in children. While it may be prescribed for children as young as 6 years for selected conditions, precautions do apply. Overdosage: If you think you have taken too much of this medicine contact a poison control center or emergency room at once. NOTE: This medicine is only for you. Do not share this medicine with others. What if I miss a dose? If you miss a dose, take it as soon as you can. If it is almost time for your next dose, take only that dose. Do not take double or extra doses. What may interact with this medication? Certain medications for seizures, such as carbamazepine, phenobarbital, phenytoin Rifampin Other medications may affect the way this medication works. Talk with your care team about all of the  medications you take. They may suggest changes to your treatment plan to lower the risk of side effects and to make sure your medications work as intended. This list may not describe all possible interactions. Give your health care provider a list of all the medicines, herbs, non-prescription drugs, or dietary supplements you use. Also tell them if you smoke, drink alcohol, or use illegal drugs. Some items may interact with your medicine. What should I watch for while using this medication? Visit your care team for regular checks on your progress. Tell your care team if your symptoms do not start to get better or if they get worse. This medication may cause thoughts of suicide or depression. This includes sudden changes in mood, behaviors, or thoughts. These changes can happen at any time but are more common in the beginning of treatment or after a change in dose. Call your care team right away if you experience these thoughts or worsening depression. Check with your care team if you have severe diarrhea, nausea, and vomiting, or if you sweat a lot. The loss of too much body fluid may make it dangerous for you to take this medication. The tablet shell of this medication does not dissolve. This is normal. The tablet shell may appear whole in the stool. This is not a cause for concern. Discuss the medication with your care team if you may be pregnant. There are benefits and risks to taking medications during pregnancy. Your care team can help you find the option that works for you. Talk to your care team before breastfeeding. Changes  to your treatment plan may be needed. What side effects may I notice from receiving this medication? Side effects that you should report to your care team as soon as possible: Allergic reactions--skin rash, itching, hives, swelling of the face, lips, tongue, or throat Thoughts of suicide or self-harm, worsening mood, feelings of depression Side effects that usually do not  require medical attention (report these to your care team if they continue or are bothersome): Diarrhea Headache Loss of appetite with weight loss Nausea Vomiting This list may not describe all possible side effects. Call your doctor for medical advice about side effects. You may report side effects to FDA at 1-800-FDA-1088. Where should I keep my medication? Keep out of the reach of children and pets. Store between 20 and 25 degrees C (68 and 77 degrees F). Get rid of any unused medication after the expiration date. To get rid of medications that are no longer needed or have expired: Take the medication to a take-back program. Check with your pharmacy or law enforcement to find a location. If you cannot return the medication, check the label or package insert to see if the medication should be thrown out in the garbage or flushed down the toilet. If you are not sure, ask your care team. If it is safe to put it in the trash, take the medication out of the container. Mix it with cat litter, dirt, coffee grounds, or another unwanted substance. Seal the mixture in a bag or container. Put it in the trash. NOTE: This sheet is a summary. It may not cover all possible information. If you have questions about this medicine, talk to your doctor, pharmacist, or health care provider.  2025 Elsevier/Gold Standard (2023-09-20 00:00:00)

## 2024-02-22 ENCOUNTER — Ambulatory Visit: Payer: Self-pay | Admitting: Physician Assistant

## 2024-02-22 NOTE — Progress Notes (Signed)
 CBC WNL

## 2024-05-22 ENCOUNTER — Ambulatory Visit: Admitting: Physician Assistant

## 2024-05-29 ENCOUNTER — Other Ambulatory Visit

## 2024-06-05 ENCOUNTER — Encounter: Admitting: Family Medicine
